# Patient Record
Sex: Male | Born: 1940 | Race: Black or African American | Hispanic: No | Marital: Married | Smoking: Former smoker
Health system: Southern US, Community
[De-identification: ages and names within clinical notes are randomized; demographics above are authoritative.]

## PROBLEM LIST (undated history)

## (undated) DIAGNOSIS — R339 Retention of urine, unspecified: Secondary | ICD-10-CM

## (undated) DIAGNOSIS — S9030XA Contusion of unspecified foot, initial encounter: Secondary | ICD-10-CM

## (undated) DIAGNOSIS — I509 Heart failure, unspecified: Secondary | ICD-10-CM

## (undated) DIAGNOSIS — R059 Cough, unspecified: Secondary | ICD-10-CM

## (undated) DIAGNOSIS — Z9981 Dependence on supplemental oxygen: Secondary | ICD-10-CM

## (undated) DIAGNOSIS — I2510 Atherosclerotic heart disease of native coronary artery without angina pectoris: Secondary | ICD-10-CM

## (undated) DIAGNOSIS — Q2730 Arteriovenous malformation, site unspecified: Secondary | ICD-10-CM

## (undated) DIAGNOSIS — IMO0002 Reserved for concepts with insufficient information to code with codable children: Secondary | ICD-10-CM

## (undated) DIAGNOSIS — I2589 Other forms of chronic ischemic heart disease: Secondary | ICD-10-CM

## (undated) DIAGNOSIS — M797 Fibromyalgia: Secondary | ICD-10-CM

## (undated) DIAGNOSIS — Z95810 Presence of automatic (implantable) cardiac defibrillator: Secondary | ICD-10-CM

## (undated) DIAGNOSIS — E1065 Type 1 diabetes mellitus with hyperglycemia: Secondary | ICD-10-CM

## (undated) DIAGNOSIS — K922 Gastrointestinal hemorrhage, unspecified: Secondary | ICD-10-CM

## (undated) DIAGNOSIS — J189 Pneumonia, unspecified organism: Secondary | ICD-10-CM

## (undated) DIAGNOSIS — R05 Cough: Secondary | ICD-10-CM

## (undated) DIAGNOSIS — E785 Hyperlipidemia, unspecified: Secondary | ICD-10-CM

## (undated) DIAGNOSIS — K5790 Diverticulosis of intestine, part unspecified, without perforation or abscess without bleeding: Secondary | ICD-10-CM

## (undated) DIAGNOSIS — I10 Essential (primary) hypertension: Secondary | ICD-10-CM

## (undated) DIAGNOSIS — K429 Umbilical hernia without obstruction or gangrene: Secondary | ICD-10-CM

## (undated) DIAGNOSIS — R809 Proteinuria, unspecified: Secondary | ICD-10-CM

## (undated) DIAGNOSIS — N6459 Other signs and symptoms in breast: Secondary | ICD-10-CM

## (undated) DIAGNOSIS — F1410 Cocaine abuse, uncomplicated: Secondary | ICD-10-CM

## (undated) DIAGNOSIS — K219 Gastro-esophageal reflux disease without esophagitis: Secondary | ICD-10-CM

## (undated) DIAGNOSIS — I639 Cerebral infarction, unspecified: Secondary | ICD-10-CM

## (undated) DIAGNOSIS — J209 Acute bronchitis, unspecified: Secondary | ICD-10-CM

## (undated) DIAGNOSIS — F1010 Alcohol abuse, uncomplicated: Secondary | ICD-10-CM

## (undated) HISTORY — DX: Cough: R05

## (undated) HISTORY — DX: Reserved for concepts with insufficient information to code with codable children: IMO0002

## (undated) HISTORY — DX: Alcohol abuse, uncomplicated: F10.10

## (undated) HISTORY — DX: Cerebral infarction, unspecified: I63.9

## (undated) HISTORY — DX: Essential (primary) hypertension: I10

## (undated) HISTORY — PX: BACK SURGERY: SHX140

## (undated) HISTORY — DX: Gastro-esophageal reflux disease without esophagitis: K21.9

## (undated) HISTORY — DX: Other forms of chronic ischemic heart disease: I25.89

## (undated) HISTORY — DX: Atherosclerotic heart disease of native coronary artery without angina pectoris: I25.10

## (undated) HISTORY — DX: Contusion of unspecified foot, initial encounter: S90.30XA

## (undated) HISTORY — DX: Retention of urine, unspecified: R33.9

## (undated) HISTORY — PX: TONSILLECTOMY: SUR1361

## (undated) HISTORY — DX: Hyperlipidemia, unspecified: E78.5

## (undated) HISTORY — DX: Cocaine abuse, uncomplicated: F14.10

## (undated) HISTORY — DX: Pneumonia, unspecified organism: J18.9

## (undated) HISTORY — DX: Cough, unspecified: R05.9

## (undated) HISTORY — DX: Fibromyalgia: M79.7

## (undated) HISTORY — DX: Gastrointestinal hemorrhage, unspecified: K92.2

## (undated) HISTORY — PX: CARDIAC DEFIBRILLATOR PLACEMENT: SHX171

## (undated) HISTORY — DX: Arteriovenous malformation, site unspecified: Q27.30

## (undated) HISTORY — DX: Type 1 diabetes mellitus with hyperglycemia: E10.65

## (undated) HISTORY — DX: Acute bronchitis, unspecified: J20.9

## (undated) HISTORY — DX: Presence of automatic (implantable) cardiac defibrillator: Z95.810

## (undated) HISTORY — DX: Umbilical hernia without obstruction or gangrene: K42.9

## (undated) HISTORY — DX: Other signs and symptoms in breast: N64.59

## (undated) HISTORY — DX: Heart failure, unspecified: I50.9

## (undated) HISTORY — DX: Proteinuria, unspecified: R80.9

## (undated) HISTORY — DX: Diverticulosis of intestine, part unspecified, without perforation or abscess without bleeding: K57.90

---

## 1997-04-16 LAB — HM COLONOSCOPY

## 1998-05-13 ENCOUNTER — Encounter: Payer: Self-pay | Admitting: Emergency Medicine

## 1998-05-13 ENCOUNTER — Emergency Department (HOSPITAL_COMMUNITY): Admission: EM | Admit: 1998-05-13 | Discharge: 1998-05-13 | Payer: Self-pay | Admitting: Emergency Medicine

## 1998-05-20 ENCOUNTER — Ambulatory Visit (HOSPITAL_COMMUNITY): Admission: RE | Admit: 1998-05-20 | Discharge: 1998-05-20 | Payer: Self-pay | Admitting: Cardiology

## 1998-05-20 ENCOUNTER — Encounter: Payer: Self-pay | Admitting: Cardiology

## 2001-01-08 ENCOUNTER — Encounter: Payer: Self-pay | Admitting: Emergency Medicine

## 2001-01-08 ENCOUNTER — Inpatient Hospital Stay (HOSPITAL_COMMUNITY): Admission: EM | Admit: 2001-01-08 | Discharge: 2001-01-10 | Payer: Self-pay | Admitting: Emergency Medicine

## 2002-05-26 ENCOUNTER — Encounter: Payer: Self-pay | Admitting: *Deleted

## 2002-05-26 ENCOUNTER — Ambulatory Visit (HOSPITAL_COMMUNITY): Admission: EM | Admit: 2002-05-26 | Discharge: 2002-05-27 | Payer: Self-pay | Admitting: *Deleted

## 2003-12-28 ENCOUNTER — Emergency Department (HOSPITAL_COMMUNITY): Admission: EM | Admit: 2003-12-28 | Discharge: 2003-12-28 | Payer: Self-pay | Admitting: Emergency Medicine

## 2004-04-14 ENCOUNTER — Ambulatory Visit: Payer: Self-pay | Admitting: Internal Medicine

## 2004-06-09 ENCOUNTER — Ambulatory Visit: Payer: Self-pay | Admitting: Internal Medicine

## 2005-01-20 ENCOUNTER — Ambulatory Visit: Payer: Self-pay | Admitting: Internal Medicine

## 2005-01-20 LAB — CONVERTED CEMR LAB: PSA: 3.06 ng/mL

## 2005-01-25 ENCOUNTER — Ambulatory Visit: Payer: Self-pay | Admitting: Internal Medicine

## 2005-02-23 ENCOUNTER — Ambulatory Visit: Payer: Self-pay

## 2005-02-23 ENCOUNTER — Encounter: Payer: Self-pay | Admitting: Cardiology

## 2005-03-01 ENCOUNTER — Ambulatory Visit: Payer: Self-pay | Admitting: *Deleted

## 2005-03-07 ENCOUNTER — Ambulatory Visit: Payer: Self-pay | Admitting: Cardiology

## 2005-03-07 ENCOUNTER — Inpatient Hospital Stay (HOSPITAL_COMMUNITY): Admission: AD | Admit: 2005-03-07 | Discharge: 2005-03-08 | Payer: Self-pay | Admitting: *Deleted

## 2005-04-11 ENCOUNTER — Ambulatory Visit: Payer: Self-pay | Admitting: *Deleted

## 2005-05-22 ENCOUNTER — Ambulatory Visit: Payer: Self-pay | Admitting: Internal Medicine

## 2005-07-07 ENCOUNTER — Encounter: Payer: Self-pay | Admitting: Cardiology

## 2005-07-07 ENCOUNTER — Ambulatory Visit: Payer: Self-pay

## 2005-07-07 ENCOUNTER — Ambulatory Visit: Payer: Self-pay | Admitting: Cardiology

## 2005-07-14 ENCOUNTER — Ambulatory Visit: Payer: Self-pay | Admitting: Internal Medicine

## 2005-07-14 ENCOUNTER — Observation Stay (HOSPITAL_COMMUNITY): Admission: RE | Admit: 2005-07-14 | Discharge: 2005-07-15 | Payer: Self-pay | Admitting: Internal Medicine

## 2005-07-27 ENCOUNTER — Ambulatory Visit: Payer: Self-pay

## 2005-10-24 ENCOUNTER — Ambulatory Visit: Payer: Self-pay | Admitting: Internal Medicine

## 2006-01-03 ENCOUNTER — Ambulatory Visit: Payer: Self-pay | Admitting: Internal Medicine

## 2006-01-05 LAB — CONVERTED CEMR LAB
ALT: 16 units/L (ref 0–40)
AST: 15 units/L (ref 0–37)
Cholesterol: 120 mg/dL (ref 0–200)
Creatinine, Ser: 1.1 mg/dL (ref 0.4–1.5)
HDL: 30.5 mg/dL — ABNORMAL LOW (ref >39.0)
Hgb A1c MFr Bld: 8.2 % — ABNORMAL HIGH (ref 4.6–6.0)
Triglyceride fasting, serum: 70 mg/dL (ref 0–149)
VLDL: 14 mg/dL (ref 0–40)

## 2006-01-11 ENCOUNTER — Ambulatory Visit: Payer: Self-pay | Admitting: Internal Medicine

## 2006-01-11 ENCOUNTER — Ambulatory Visit: Payer: Self-pay | Admitting: Cardiovascular Disease

## 2006-01-12 ENCOUNTER — Ambulatory Visit: Payer: Self-pay | Admitting: Internal Medicine

## 2006-01-15 ENCOUNTER — Ambulatory Visit: Payer: Self-pay | Admitting: Internal Medicine

## 2006-01-17 ENCOUNTER — Ambulatory Visit: Payer: Self-pay | Admitting: Internal Medicine

## 2006-02-11 ENCOUNTER — Emergency Department (HOSPITAL_COMMUNITY): Admission: EM | Admit: 2006-02-11 | Discharge: 2006-02-12 | Payer: Self-pay | Admitting: Emergency Medicine

## 2006-03-08 ENCOUNTER — Ambulatory Visit: Payer: Self-pay | Admitting: Internal Medicine

## 2006-03-22 ENCOUNTER — Ambulatory Visit: Payer: Self-pay | Admitting: Internal Medicine

## 2006-07-26 ENCOUNTER — Ambulatory Visit: Payer: Self-pay

## 2006-07-26 ENCOUNTER — Encounter: Payer: Self-pay | Admitting: Internal Medicine

## 2006-07-26 ENCOUNTER — Ambulatory Visit: Payer: Self-pay | Admitting: Internal Medicine

## 2006-07-27 ENCOUNTER — Ambulatory Visit: Payer: Self-pay | Admitting: Internal Medicine

## 2006-11-05 ENCOUNTER — Ambulatory Visit: Payer: Self-pay | Admitting: Internal Medicine

## 2006-11-14 ENCOUNTER — Encounter: Payer: Self-pay | Admitting: Internal Medicine

## 2006-11-14 ENCOUNTER — Ambulatory Visit: Payer: Self-pay | Admitting: Internal Medicine

## 2006-11-14 DIAGNOSIS — I10 Essential (primary) hypertension: Secondary | ICD-10-CM

## 2006-11-14 DIAGNOSIS — E119 Type 2 diabetes mellitus without complications: Secondary | ICD-10-CM

## 2006-11-23 ENCOUNTER — Encounter: Payer: Self-pay | Admitting: Internal Medicine

## 2006-11-23 DIAGNOSIS — K219 Gastro-esophageal reflux disease without esophagitis: Secondary | ICD-10-CM

## 2006-11-23 DIAGNOSIS — I2510 Atherosclerotic heart disease of native coronary artery without angina pectoris: Secondary | ICD-10-CM | POA: Insufficient documentation

## 2006-12-10 ENCOUNTER — Ambulatory Visit: Payer: Self-pay | Admitting: Internal Medicine

## 2006-12-10 LAB — CONVERTED CEMR LAB: Hgb A1c MFr Bld: 7.8 % — ABNORMAL HIGH (ref 4.6–6.0)

## 2006-12-17 ENCOUNTER — Ambulatory Visit: Payer: Self-pay | Admitting: Internal Medicine

## 2006-12-17 DIAGNOSIS — E785 Hyperlipidemia, unspecified: Secondary | ICD-10-CM

## 2006-12-17 DIAGNOSIS — IMO0001 Reserved for inherently not codable concepts without codable children: Secondary | ICD-10-CM

## 2006-12-27 ENCOUNTER — Ambulatory Visit: Payer: Self-pay | Admitting: Internal Medicine

## 2007-01-30 ENCOUNTER — Telehealth: Payer: Self-pay | Admitting: Internal Medicine

## 2007-02-11 ENCOUNTER — Ambulatory Visit: Payer: Self-pay | Admitting: Internal Medicine

## 2007-02-12 ENCOUNTER — Ambulatory Visit: Payer: Self-pay | Admitting: Internal Medicine

## 2007-02-12 DIAGNOSIS — N6459 Other signs and symptoms in breast: Secondary | ICD-10-CM

## 2007-02-12 LAB — CONVERTED CEMR LAB: Prolactin: 2.6 ng/mL

## 2007-02-18 ENCOUNTER — Telehealth: Payer: Self-pay | Admitting: Internal Medicine

## 2007-02-20 ENCOUNTER — Encounter: Payer: Self-pay | Admitting: Internal Medicine

## 2007-03-07 ENCOUNTER — Encounter (INDEPENDENT_AMBULATORY_CARE_PROVIDER_SITE_OTHER): Payer: Self-pay | Admitting: *Deleted

## 2007-04-02 ENCOUNTER — Ambulatory Visit: Payer: Self-pay | Admitting: Internal Medicine

## 2007-04-02 LAB — CONVERTED CEMR LAB
Hgb A1c MFr Bld: 10.4 % — ABNORMAL HIGH (ref 4.6–6.0)
Sodium: 140 meq/L (ref 135–145)

## 2007-04-04 ENCOUNTER — Encounter: Payer: Self-pay | Admitting: Internal Medicine

## 2007-04-05 ENCOUNTER — Ambulatory Visit: Payer: Self-pay | Admitting: Internal Medicine

## 2007-04-12 ENCOUNTER — Ambulatory Visit: Payer: Self-pay | Admitting: Internal Medicine

## 2007-04-15 ENCOUNTER — Telehealth: Payer: Self-pay | Admitting: Internal Medicine

## 2007-04-17 ENCOUNTER — Ambulatory Visit: Payer: Self-pay | Admitting: Endocrinology

## 2007-04-17 DIAGNOSIS — S9030XA Contusion of unspecified foot, initial encounter: Secondary | ICD-10-CM

## 2007-05-08 ENCOUNTER — Ambulatory Visit: Payer: Self-pay | Admitting: Endocrinology

## 2007-06-24 ENCOUNTER — Ambulatory Visit: Payer: Self-pay | Admitting: Endocrinology

## 2007-07-31 ENCOUNTER — Ambulatory Visit: Payer: Self-pay | Admitting: Internal Medicine

## 2007-08-05 ENCOUNTER — Ambulatory Visit: Payer: Self-pay | Admitting: Endocrinology

## 2007-11-06 ENCOUNTER — Ambulatory Visit: Payer: Self-pay | Admitting: Internal Medicine

## 2007-11-19 ENCOUNTER — Ambulatory Visit: Payer: Self-pay | Admitting: Internal Medicine

## 2007-11-19 DIAGNOSIS — J209 Acute bronchitis, unspecified: Secondary | ICD-10-CM

## 2007-12-26 ENCOUNTER — Encounter: Payer: Self-pay | Admitting: Endocrinology

## 2008-01-14 ENCOUNTER — Ambulatory Visit: Payer: Self-pay | Admitting: Endocrinology

## 2008-01-21 ENCOUNTER — Ambulatory Visit: Payer: Self-pay | Admitting: Internal Medicine

## 2008-01-21 LAB — CONVERTED CEMR LAB
CO2: 28 meq/L (ref 19–32)
Calcium: 9.1 mg/dL (ref 8.4–10.5)
Chloride: 107 meq/L (ref 96–112)
Glucose, Bld: 142 mg/dL — ABNORMAL HIGH (ref 70–99)
Pro B Natriuretic peptide (BNP): 711 pg/mL — ABNORMAL HIGH (ref 0.0–100.0)
Sodium: 141 meq/L (ref 135–145)

## 2008-03-10 ENCOUNTER — Ambulatory Visit: Payer: Self-pay | Admitting: Endocrinology

## 2008-03-10 DIAGNOSIS — J189 Pneumonia, unspecified organism: Secondary | ICD-10-CM | POA: Insufficient documentation

## 2008-03-10 DIAGNOSIS — R05 Cough: Secondary | ICD-10-CM

## 2008-03-12 ENCOUNTER — Encounter: Payer: Self-pay | Admitting: Internal Medicine

## 2008-04-02 ENCOUNTER — Ambulatory Visit: Payer: Self-pay | Admitting: Internal Medicine

## 2008-04-03 ENCOUNTER — Telehealth (INDEPENDENT_AMBULATORY_CARE_PROVIDER_SITE_OTHER): Payer: Self-pay | Admitting: *Deleted

## 2008-04-09 ENCOUNTER — Ambulatory Visit: Payer: Self-pay | Admitting: Internal Medicine

## 2008-04-14 ENCOUNTER — Ambulatory Visit: Payer: Self-pay | Admitting: Internal Medicine

## 2008-04-14 DIAGNOSIS — R109 Unspecified abdominal pain: Secondary | ICD-10-CM | POA: Insufficient documentation

## 2008-04-14 DIAGNOSIS — R339 Retention of urine, unspecified: Secondary | ICD-10-CM

## 2008-04-15 LAB — CONVERTED CEMR LAB
Bilirubin Urine: NEGATIVE
CO2: 29 meq/L (ref 19–32)
Chloride: 105 meq/L (ref 96–112)
GFR calc non Af Amer: 23 mL/min
Leukocytes, UA: NEGATIVE
Nitrite: NEGATIVE
PSA: 5.22 ng/mL — ABNORMAL HIGH (ref 0.10–4.00)
Potassium: 4.3 meq/L (ref 3.5–5.1)
Sodium: 143 meq/L (ref 135–145)
Specific Gravity, Urine: 1.025 (ref 1.000–1.035)
Total Protein, Urine: 100 mg/dL — AB
pH: 5.5 (ref 5.0–8.0)

## 2008-07-04 DIAGNOSIS — I5022 Chronic systolic (congestive) heart failure: Secondary | ICD-10-CM | POA: Insufficient documentation

## 2008-07-04 DIAGNOSIS — Z95810 Presence of automatic (implantable) cardiac defibrillator: Secondary | ICD-10-CM

## 2008-07-04 DIAGNOSIS — Z9089 Acquired absence of other organs: Secondary | ICD-10-CM

## 2008-07-09 ENCOUNTER — Ambulatory Visit: Payer: Self-pay | Admitting: Internal Medicine

## 2008-07-09 ENCOUNTER — Encounter: Payer: Self-pay | Admitting: Internal Medicine

## 2008-07-10 ENCOUNTER — Encounter: Payer: Self-pay | Admitting: Internal Medicine

## 2008-08-05 ENCOUNTER — Telehealth (INDEPENDENT_AMBULATORY_CARE_PROVIDER_SITE_OTHER): Payer: Self-pay | Admitting: *Deleted

## 2008-10-23 ENCOUNTER — Ambulatory Visit: Payer: Self-pay | Admitting: Internal Medicine

## 2008-11-25 ENCOUNTER — Telehealth: Payer: Self-pay | Admitting: Internal Medicine

## 2008-11-25 ENCOUNTER — Encounter: Payer: Self-pay | Admitting: Internal Medicine

## 2009-01-22 ENCOUNTER — Encounter (INDEPENDENT_AMBULATORY_CARE_PROVIDER_SITE_OTHER): Payer: Self-pay | Admitting: *Deleted

## 2009-02-10 ENCOUNTER — Telehealth: Payer: Self-pay | Admitting: Internal Medicine

## 2009-02-12 ENCOUNTER — Ambulatory Visit: Payer: Self-pay | Admitting: Endocrinology

## 2009-02-12 DIAGNOSIS — R809 Proteinuria, unspecified: Secondary | ICD-10-CM | POA: Insufficient documentation

## 2009-02-12 LAB — CONVERTED CEMR LAB
Creatinine,U: 200.6 mg/dL
Hgb A1c MFr Bld: 6.9 % — ABNORMAL HIGH (ref 4.6–6.5)
Microalb Creat Ratio: 177.5 mg/g — ABNORMAL HIGH (ref 0.0–30.0)
Microalb, Ur: 35.6 mg/dL — ABNORMAL HIGH (ref 0.0–1.9)

## 2009-04-27 ENCOUNTER — Ambulatory Visit: Payer: Self-pay | Admitting: Endocrinology

## 2009-06-02 ENCOUNTER — Ambulatory Visit: Payer: Self-pay | Admitting: Internal Medicine

## 2009-06-02 DIAGNOSIS — I739 Peripheral vascular disease, unspecified: Secondary | ICD-10-CM

## 2009-06-04 ENCOUNTER — Encounter: Payer: Self-pay | Admitting: Internal Medicine

## 2009-06-16 ENCOUNTER — Encounter: Payer: Self-pay | Admitting: Internal Medicine

## 2009-06-20 ENCOUNTER — Inpatient Hospital Stay (HOSPITAL_COMMUNITY): Admission: EM | Admit: 2009-06-20 | Discharge: 2009-06-30 | Payer: Self-pay | Admitting: Family Medicine

## 2009-06-23 ENCOUNTER — Ambulatory Visit: Payer: Self-pay | Admitting: Physical Medicine & Rehabilitation

## 2009-06-25 ENCOUNTER — Ambulatory Visit: Payer: Self-pay | Admitting: Gastroenterology

## 2009-07-05 ENCOUNTER — Ambulatory Visit: Payer: Self-pay | Admitting: Internal Medicine

## 2009-07-05 ENCOUNTER — Inpatient Hospital Stay (HOSPITAL_COMMUNITY): Admission: EM | Admit: 2009-07-05 | Discharge: 2009-07-08 | Payer: Self-pay | Admitting: Emergency Medicine

## 2009-07-06 ENCOUNTER — Encounter: Payer: Self-pay | Admitting: Internal Medicine

## 2009-07-07 ENCOUNTER — Encounter (INDEPENDENT_AMBULATORY_CARE_PROVIDER_SITE_OTHER): Payer: Self-pay | Admitting: Internal Medicine

## 2009-08-01 ENCOUNTER — Encounter: Payer: Self-pay | Admitting: Internal Medicine

## 2009-08-03 ENCOUNTER — Ambulatory Visit: Payer: Self-pay | Admitting: Internal Medicine

## 2009-09-16 ENCOUNTER — Telehealth: Payer: Self-pay | Admitting: Internal Medicine

## 2009-09-17 ENCOUNTER — Ambulatory Visit: Payer: Self-pay

## 2009-09-17 ENCOUNTER — Encounter: Payer: Self-pay | Admitting: Internal Medicine

## 2009-11-11 ENCOUNTER — Encounter: Payer: Self-pay | Admitting: Internal Medicine

## 2010-01-16 ENCOUNTER — Encounter: Payer: Self-pay | Admitting: Internal Medicine

## 2010-01-19 ENCOUNTER — Encounter: Payer: Self-pay | Admitting: Internal Medicine

## 2010-01-21 ENCOUNTER — Ambulatory Visit: Payer: Self-pay | Admitting: Internal Medicine

## 2010-01-21 DIAGNOSIS — I69959 Hemiplegia and hemiparesis following unspecified cerebrovascular disease affecting unspecified side: Secondary | ICD-10-CM | POA: Insufficient documentation

## 2010-01-25 ENCOUNTER — Telehealth: Payer: Self-pay | Admitting: Internal Medicine

## 2010-02-01 ENCOUNTER — Encounter: Payer: Self-pay | Admitting: Internal Medicine

## 2010-02-02 ENCOUNTER — Encounter (INDEPENDENT_AMBULATORY_CARE_PROVIDER_SITE_OTHER): Payer: Self-pay | Admitting: *Deleted

## 2010-02-03 ENCOUNTER — Telehealth: Payer: Self-pay | Admitting: Internal Medicine

## 2010-02-03 ENCOUNTER — Encounter: Payer: Self-pay | Admitting: Internal Medicine

## 2010-02-09 ENCOUNTER — Telehealth: Payer: Self-pay | Admitting: Internal Medicine

## 2010-02-11 ENCOUNTER — Encounter: Payer: Self-pay | Admitting: Internal Medicine

## 2010-02-16 ENCOUNTER — Encounter: Payer: Self-pay | Admitting: Internal Medicine

## 2010-02-17 ENCOUNTER — Telehealth: Payer: Self-pay | Admitting: Internal Medicine

## 2010-02-18 ENCOUNTER — Encounter: Payer: Self-pay | Admitting: Internal Medicine

## 2010-02-21 ENCOUNTER — Encounter: Payer: Self-pay | Admitting: Internal Medicine

## 2010-02-21 ENCOUNTER — Ambulatory Visit
Admission: RE | Admit: 2010-02-21 | Discharge: 2010-02-21 | Payer: Self-pay | Source: Home / Self Care | Attending: Internal Medicine | Admitting: Internal Medicine

## 2010-02-21 ENCOUNTER — Telehealth: Payer: Self-pay | Admitting: Internal Medicine

## 2010-02-23 ENCOUNTER — Encounter: Payer: Self-pay | Admitting: Internal Medicine

## 2010-02-24 ENCOUNTER — Ambulatory Visit: Admit: 2010-02-24 | Payer: Self-pay | Admitting: Internal Medicine

## 2010-03-01 ENCOUNTER — Encounter: Payer: Self-pay | Admitting: Internal Medicine

## 2010-03-07 ENCOUNTER — Encounter: Payer: Self-pay | Admitting: Internal Medicine

## 2010-03-08 ENCOUNTER — Encounter: Payer: Self-pay | Admitting: Internal Medicine

## 2010-03-08 ENCOUNTER — Telehealth: Payer: Self-pay | Admitting: Internal Medicine

## 2010-03-08 NOTE — Progress Notes (Signed)
Summary: defib soreness & arm weakness   Phone Note From Other Clinic   Caller: nurse Tim  Summary of Call: Per Anibal Henderson soreness around defib site and left arm weakness. [phone] Initial call taken by: Edman Circle,  September 16, 2009 4:00 PM  Follow-up for Phone Call        SPOKE WITH CRYSTAL AT NURSING FACILITY  PER WIFE SWELLING AT DEVICE SITE NOT NOTED FOR LONG PERIOD  CRYSTAL AWARE CAN SEE PT TODAY WITH PAULA DEVICE NURSE AND CHECK DEVICE  TO MAKE SURE DEVICE WORKING PROPERLY AND TO LOOK AT SITE  PER NURSE WILL TRY TO MAKE TRANSPORTATION ARRANGEMENTS FOR PT TO BE SEEN THIS AM WILL CALL BACK IF UNABLE TO COME IN . Follow-up by: Scherrie Bateman, LPN,  September 17, 2009 8:46 AM  Additional Follow-up for Phone Call Additional follow up Details #1::        CRYSTAL FROM [facility]  CALLED AND PT CAN BE HERE WITHIN THE HOUR . Additional Follow-up by: Scherrie Bateman, LPN,  September 17, 2009 9:10 AM

## 2010-03-08 NOTE — Letter (Signed)
Summary: Device-Delinquent Phone Journalist, newspaper, Main Office  [address]   [place], [place] [address]   Phone: [phone]  Fax: [phone]     November 11, 2009 MRN: [id number]   CARLOS HEBER [address] [place], [place]  [address]   Dear Mr. Hunke,  According to our records, you were scheduled for a device phone transmission on  11-04-09.     We did not receive any results from this check.  If you transmitted on your scheduled day, please call us to help troubleshoot your system.  If you forgot to send your transmission, please send one upon receipt of this letter.  Thank you,   Architectural technologist Device Clinic

## 2010-03-08 NOTE — Assessment & Plan Note (Signed)
Summary: df2/post hosp/discharged on 6/2/OK PER KELLY/LG  Medications Added CARVEDILOL 25 MG  TABS (CARVEDILOL) TAKE  1/2 TAB two times a day NOVOLOG 100 UNIT/ML SOLN (INSULIN ASPART) UAD LANTUS 100 UNIT/ML SOLN (INSULIN GLARGINE) UAD PROSCAR 5 MG TABS (FINASTERIDE) Take one tablet by mouth once daily. HYDRALAZINE HCL 25 MG TABS (HYDRALAZINE HCL) Take one tablet by mouth three times a day FAMOTIDINE 20 MG TABS (FAMOTIDINE) 1/2 tablet three times a day VENTOLIN HFA 108 (90 BASE) MCG/ACT AERS (ALBUTEROL SULFATE) UAD FUROSEMIDE 40 MG TABS (FUROSEMIDE) Take one tablet by mouth daily.      Allergies Added:   Visit Type:  Follow-up Primary Provider:  Norins   History of Present Illness: [name] returns today for followup.  He is a pleasant 70 yo man with a h/o DCM, CHF, HTN, and is s/p ICD implant.  The patient experienced a stroke several weeks ago and was discharged with residual weakness.  He has had no dyspnea but does note some increased peripheral edema.  No ICD shocks.  No c/p or syncope.  His stroke was thought secondary to a CNS bleed.  Current Medications (verified): 1)  Simvastatin 40 Mg  Tabs (Simvastatin) .... Q Pm 2)  Carvedilol 25 Mg  Tabs (Carvedilol) .... Take  1/2 Tab Two Times A Day 3)  Flomax 0.4 Mg Cp24 (Tamsulosin Hcl) .... Take 1 Capsule By Mouth Nightly 4)  Novolog 100 Unit/ml Soln (Insulin Aspart) .... Uad 5)  Lantus 100 Unit/ml Soln (Insulin Glargine) .... Uad 6)  Proscar 5 Mg Tabs (Finasteride) .... Take One Tablet By Mouth Once Daily. 7)  Hydralazine Hcl 25 Mg Tabs (Hydralazine Hcl) .... Take One Tablet By Mouth Three Times A Day 8)  Famotidine 20 Mg Tabs (Famotidine) .... 1/2 Tablet Three Times A Day 9)  Ventolin Hfa 108 (90 Base) Mcg/act Aers (Albuterol Sulfate) .... Uad  Allergies (verified): 1)  ! 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) IMPLANTATION OF DEFIBRILLATOR, HX OF (ICD-V45.02) CONGESTIVE HEART FAILURE (ICD-428.0) ABDOMINAL PAIN (ICD-789.00) URINARY RETENTION (ICD-788.20) PNEUMONIA (ICD-486) COUGH (ICD-786.2) ACUTE BRONCHITIS (ICD-466.0) CONTUSION OF FOOT (ICD-924.20) OTHER SIGN AND SYMPTOM IN BREAST (ICD-611.79) FIBROMYALGIA (ICD-729.1) HYPERLIPIDEMIA (ICD-272.4) CORONARY ARTERY DISEASE (ICD-414.00) GERD (ICD-530.81) DM, UNCOMPLICATED, TYPE I, UNCONTROLLED (ICD-250.03) HYPERTENSION, BENIGN ESSENTIAL (ICD-401.1)    Past Surgical History: Last updated: 07/04/2008 * BACK SURGERY TONSILLECTOMY, HX OF (ICD-V45.79)  Review of Systems       The patient complains of peripheral edema.  The patient denies chest pain, syncope, and dyspnea on exertion.    Vital Signs:  Patient profile:   70 year old male Height:      68 inches  Vitals Entered By: Laurance Flatten CMA (August 03, 2009 1:40 PM)  Physical Exam  General:  obese.  no distress  Head:  normocephalic and atraumatic Eyes:  PERRLA/EOM intact; conjunctiva and lids normal. Mouth:  Teeth, gums and palate normal. Oral mucosa normal. Neck:  Supple without thyroid enlargement or tenderness.  Chest Wall:  well healed ICD incision. Lungs:  Clear bilaterally with no wheezes, rales, or rhonchi. Heart:  RRR with normal S1 and a split S2.  PMI is enlarged and laterally displaced.  No murmurs. Abdomen:  Bowel sounds positive; abdomen soft and non-tender without masses, organomegaly, or hernias noted. No hepatosplenomegaly. Msk:  Back normal, normal gait. Muscle strength and tone normal. Pulses:  dorsalis pedis intact bilat but decreased. Extremities:  no deformity.  no ulcer on the feet.  feet are of normal color and temp. 2+ peripheral edema bilaterally. Neurologic:  Right arm and leg weakness.    ICD Specifications Following MD:  Lewayne Bunting, MD     ICD Vendor:  [facility] [employer]     ICD Model Number:  T175     ICD Serial Number:  [id number] ICD  DOI:  07/14/2005     ICD Implanting MD:  Lewayne Bunting, MD  Lead 1:    Location: RV     DOI: 07/14/2005     Model #: [id number]     Serial #: [id number]     Status: active  Indications::  NICM   ICD Follow Up Remote Check?  No Battery Voltage:  2.66 V     Charge Time:  13.6 seconds     Underlying rhythm:  ST ICD Dependent:  No       ICD Device Measurements Right Ventricle:  Amplitude: 18.5 mV, Impedance: 607 ohms, Threshold: 0.8 V at 0.4 msec Shock Impedance: 39 ohms   Episodes Coumadin:  No Shock:  0     ATP:  0     Nonsustained:  0     Ventricular Pacing:  <1%  Brady Parameters Mode VVI     Lower Rate Limit:  40      Tachy Zones VF:  210     VT1:  180     Next Remote Date:  11/04/2009     Next Cardiology Appt Due:  07/08/2010 Tech Comments:  Normal device function.  RV output decreaesd today to 2.4V.  No other changes made.  Pt does Latitude transmissions.  ROV 12 months GT. Gypsy Balsam RN BSN  August 03, 2009 1:53 PM  MD Comments:  Agree with above.  Impression & Recommendations:  Problem # 1:  IMPLANTATION OF DEFIBRILLATOR, HX OF (ICD-V45.02) His device is working normally. Will recheck in several months.  Problem # 2:  CONGESTIVE HEART FAILURE (ICD-428.0)  He remains class 2 but is somewhat fluid overloaded with peripheral edema.  I have asked him to restart lasix 40 mg daily. The following medications were removed from the medication list:    Bayer Aspirin 325 Mg Tabs (Aspirin) ..... Once daily    Furosemide 20 Mg Tabs (Furosemide) .Marland Kitchen... 2 in am/ 1 in pm    Benicar 40 Mg Tabs (Olmesartan medoxomil) ..... By mouth once daily His updated medication list for this problem includes:    Carvedilol 25 Mg Tabs (Carvedilol) .Marland Kitchen... Take  1/2 tab two times a day    Furosemide 40 Mg Tabs (Furosemide) .Marland Kitchen... Take one tablet by mouth daily.  The following medications were removed from the medication list:    Bayer Aspirin 325 Mg Tabs (Aspirin) ..... Once daily    Furosemide 20 Mg Tabs  (Furosemide) .Marland Kitchen... 2 in am/ 1 in pm    Benicar 40 Mg Tabs (Olmesartan medoxomil) ..... By mouth once daily His updated medication list for this problem includes:    Carvedilol 25 Mg Tabs (Carvedilol) .Marland Kitchen... Take  1/2 tab two times a day    Furosemide 40 Mg Tabs (Furosemide) .Marland Kitchen... Take one tablet by mouth daily.  Problem # 3:  CONGESTIVE HEART FAILURE (ICD-428.0)  The following medications were removed from the medication list:    Bayer Aspirin 325 Mg Tabs (Aspirin) ..... Once daily    Furosemide 20 Mg Tabs (Furosemide) .Marland Kitchen... 2 in am/ 1 in pm    Benicar 40 Mg Tabs (Olmesartan medoxomil) .Marland KitchenMarland KitchenMarland KitchenMarland Kitchen  By mouth once daily His updated medication list for this problem includes:    Carvedilol 25 Mg Tabs (Carvedilol) .Marland Kitchen... Take  1/2 tab two times a day    Furosemide 40 Mg Tabs (Furosemide) .Marland Kitchen... Take one tablet by mouth daily. Prescriptions: FUROSEMIDE 40 MG TABS (FUROSEMIDE) Take one tablet by mouth daily.  #30 x 11   Entered by:   Laurance Flatten CMA   Authorized by:   Laren Boom, MD, [facility]   Signed by:   Laurance Flatten CMA on 08/03/2009   Method used:   Print then Give to Patient   RxID:   [id number]

## 2010-03-08 NOTE — Assessment & Plan Note (Signed)
Summary: f1y/jss  Medications Added RANITIDINE HCL 75 MG  TABS (RANITIDINE HCL) two times a day KLOR-CON M20 20 MEQ  TBCR (POTASSIUM CHLORIDE CRYS CR) 1 tab am, 1 tab pm      Allergies Added:   Primary Provider:  Norins  CC:  follow up 1 year.  History of Present Illness: [name] returns today for followup.  His main complaint is weakness in his legs when he walks, particularly if he goes up a hill.  No outright pain.  He denies c/p  but does have dyspnea with exertion.  No intercurrent ICD shocks.  Current Medications (verified): 1)  Simvastatin 40 Mg  Tabs (Simvastatin) .... Q Pm 2)  Bayer Aspirin 325 Mg  Tabs (Aspirin) .... Once Daily 3)  Ranitidine Hcl 75 Mg  Tabs (Ranitidine Hcl) .... Two Times A Day 4)  Carvedilol 25 Mg  Tabs (Carvedilol) .... Take 1 1/2 Tab Two Times A Day 5)  Klor-Con M20 20 Meq  Tbcr (Potassium Chloride Crys Cr) .Marland Kitchen.. 1 Tab Am, 1 Tab Pm 6)  Furosemide 20 Mg  Tabs (Furosemide) .... 2 in Am/ 1 in Pm 7)  Benicar 40 Mg  Tabs (Olmesartan Medoxomil) .... By Mouth Once Daily 8)  Freestyle Lite Test  Strp (Glucose Blood) .... Two Times A Day, 250.01, and Lancets 9)  Flomax 0.4 Mg Cp24 (Tamsulosin Hcl) .... Take 1 Capsule By Mouth Nightly 10)  Avodart 0.5 Mg Caps (Dutasteride) .Marland Kitchen.. 1 By Mouth Daily 11)  Humulin 70/30 70-30 % Susp (Insulin Isophane & Regular) .... 30 Units Am and 20 Units Before The Evening Meal.  Allergies (verified): 1)  ! 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) IMPLANTATION OF DEFIBRILLATOR, HX OF (ICD-V45.02) CONGESTIVE HEART FAILURE (ICD-428.0) ABDOMINAL PAIN (ICD-789.00) URINARY RETENTION (ICD-788.20) PNEUMONIA (ICD-486) COUGH (ICD-786.2) ACUTE BRONCHITIS (ICD-466.0) CONTUSION OF FOOT (ICD-924.20) OTHER SIGN AND SYMPTOM IN BREAST (ICD-611.79) FIBROMYALGIA (ICD-729.1) HYPERLIPIDEMIA (ICD-272.4) CORONARY ARTERY DISEASE  (ICD-414.00) GERD (ICD-530.81) DM, UNCOMPLICATED, TYPE I, UNCONTROLLED (ICD-250.03) HYPERTENSION, BENIGN ESSENTIAL (ICD-401.1)    Past Surgical History: Last updated: 07/04/2008 * BACK SURGERY TONSILLECTOMY, HX OF (ICD-V45.79)  Review of Systems  The patient denies chest pain, syncope, dyspnea on exertion, and peripheral edema.    Vital Signs:  Patient profile:   70 year old male Height:      68 inches Weight:      223 pounds BMI:     34.03 Pulse rate:   62 / minute Pulse rhythm:   regular BP sitting:   148 / 76  (left arm) Cuff size:   large  Vitals Entered By: Judithe Modest CMA (June 02, 2009 11:09 AM)  Physical Exam  General:  obese.  no distress  Head:  normocephalic and atraumatic Eyes:  PERRLA/EOM intact; conjunctiva and lids normal. Mouth:  Teeth, gums and palate normal. Oral mucosa normal. Neck:  Supple without thyroid enlargement or tenderness.  Chest Wall:  well healed ICD incision. Lungs:  Clear bilaterally with no wheezes, rales, or rhonchi. Heart:  RRR with normal S1 and a split S2.  PMI is enlarged and laterally displaced.  No murmurs. Abdomen:  Bowel sounds positive; abdomen soft and non-tender without masses, organomegaly, or hernias noted. No hepatosplenomegaly. Msk:  Back normal, normal gait. Muscle strength and tone normal. Pulses:  dorsalis pedis intact bilat but decreased. Extremities:  no deformity.  no ulcer on the feet.  feet are of normal color and temp.  trace right pedal edema and trace left pedal edema.   Neurologic:  Alert and oriented x 3.   EKG  Procedure date:  06/02/2009  Findings:      Normal sinus rhythm with rate of:62.  First degree AV-Block noted.  Left axis deviation.  Right bundle branch block.     ICD Specifications Following MD:  Lewayne Bunting, MD     ICD Vendor:  [facility] [employer]     ICD Model Number:  T175     ICD Serial Number:  [id number] ICD DOI:  07/14/2005     ICD Implanting MD:  Lewayne Bunting, MD  Lead 1:     Location: RV     DOI: 07/14/2005     Model #: [id number]     Serial #: [id number]     Status: active  Indications::  NICM   ICD Follow Up Remote Check?  No Battery Voltage:  2.68 V     Charge Time:  13.6 seconds     Underlying rhythm:  SR ICD Dependent:  No       ICD Device Measurements Right Ventricle:  Amplitude: 22Q mV, Impedance: 615 ohms, Threshold: 0.6 V at 0.4 msec Shock Impedance: 43 ohms   Episodes Coumadin:  No Shock:  0     ATP:  0     Nonsustained:  1     Ventricular Pacing:  0%  Brady Parameters Mode VVI     Lower Rate Limit:  40      Tachy Zones VF:  210     VT1:  180     Next Remote Date:  09/02/2009     Next Cardiology Appt Due:  05/09/2010 Tech Comments:  Normal device function.  No changes made today.  Pt c/o lack of energy.  Histagrams appropriate.  1 NSVT episode was diverted.  Pt does Latitude transmissions.  ROV 12 months GT. Gypsy Balsam RN BSN  June 02, 2009 11:29 AM  MD Comments:  Agree with above.  Impression & Recommendations:  Problem # 1:  IMPLANTATION OF DEFIBRILLATOR, HX OF (ICD-V45.02) His device is working normally.  Will recheck in several months.  Problem # 2:  NONISCHEMIC CARDIOMYOPATHY (ICD-414.8)  He denies anginal symptoms or worsening SOB. His updated medication list for this problem includes:    Bayer Aspirin 325 Mg Tabs (Aspirin) ..... Once daily    Carvedilol 25 Mg Tabs (Carvedilol) .Marland Kitchen... Take 1 1/2 tab two times a day    Furosemide 20 Mg Tabs (Furosemide) .Marland Kitchen... 2 in am/ 1 in pm    Benicar 40 Mg Tabs (Olmesartan medoxomil) ..... By mouth once daily  His updated medication list for this problem includes:    Bayer Aspirin 325 Mg Tabs (Aspirin) ..... Once daily    Carvedilol 25 Mg Tabs (Carvedilol) .Marland Kitchen... Take 1 1/2 tab two times a day    Furosemide 20 Mg Tabs (Furosemide) .Marland Kitchen... 2 in am/ 1 in pm    Benicar 40 Mg Tabs (Olmesartan medoxomil) ..... By mouth once daily  Problem # 3:  CONGESTIVE HEART FAILURE (ICD-428.0)  His symptoms remain  class 2.  Continue meds as noted below. His updated medication list for this problem includes:    Bayer Aspirin 325 Mg Tabs (Aspirin) ..... Once daily    Carvedilol 25 Mg Tabs (Carvedilol) .Marland Kitchen... Take 1 1/2 tab two times a day    Furosemide 20 Mg Tabs (Furosemide) .Marland Kitchen... 2 in am/ 1 in pm    Benicar 40 Mg Tabs (Olmesartan  medoxomil) ..... By mouth once daily  His updated medication list for this problem includes:    Bayer Aspirin 325 Mg Tabs (Aspirin) ..... Once daily    Carvedilol 25 Mg Tabs (Carvedilol) .Marland Kitchen... Take 1 1/2 tab two times a day    Furosemide 20 Mg Tabs (Furosemide) .Marland Kitchen... 2 in am/ 1 in pm    Benicar 40 Mg Tabs (Olmesartan medoxomil) ..... 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  Other Orders: Arterial Duplex Lower Extremity (Arterial Duplex Low)  Patient Instructions: 1)  Your physician has requested that you have an ankle brachial index (ABI). During this test an ultrasound and blood pressure cuff are used to evaluate the arteries that supply the arms and legs with blood. Allow thirty minutes for this exam. There are no restrictions or special instructions. 2)  Your physician wants you to follow-up in: 12 months with Dr Ladona Ridgel.  You will receive a reminder letter in the mail two months in advance. If you don't receive a letter, please call our office to schedule the follow-up appointment.

## 2010-03-08 NOTE — Cardiovascular Report (Signed)
Summary: Office Visit   Office Visit   Imported By: Roderic Ovens 10/07/2009 15:41:12  _____________________________________________________________________  External Attachment:    Type:   Image     Comment:   External Document

## 2010-03-08 NOTE — Letter (Signed)
Summary: [name] - RX Resident Profile  [name] - RX Resident Profile   Imported By: Marylou Mccoy 09/01/2009 12:24:01  _____________________________________________________________________  External Attachment:    Type:   Image     Comment:   External Document

## 2010-03-08 NOTE — Letter (Signed)
Summary: CMN request for Diabetic Shoes/Abdulaziz Marinus Maw MD  CMN request for Diabetic Shoes/Darrnell Marinus Maw MD   Imported By: Sherian Rein 06/10/2009 09:18:02  _____________________________________________________________________  External Attachment:    Type:   Image     Comment:   External Document

## 2010-03-08 NOTE — Progress Notes (Signed)
Summary: Humulin not covered--?ReliOn  Phone Note Call from Patient Call back at [facility] Phone [phone]   Summary of Call: Spoke with patient spouse and insurance will not pay for Humulin Insulin, but RelIOn is covered. Patient needs this prescription sent to [facility] on [address]. Patient has been paying for his insulin out of pocket and really needs to switch to one that he can get insurance coverage. Please advise if this is ok, dosage, and refills. Thanks. Initial call taken by: Lucious Groves,  February 10, 2009 4:03 PM  Follow-up for Phone Call        never heard of rellon. There is novolog, humalog, humulin Follow-up by: Jacques Navy MD,  February 10, 2009 5:17 PM  Additional Follow-up for Phone Call Additional follow up Details #1::        Relion is the [employer] brand, pt can get it cheaper but we must change med in EMR to relion humulin and send in rx. OK?  Additional Follow-up by: Lamar Sprinkles, CMA,  February 11, 2009 9:57 AM    Additional Follow-up for Phone Call Additional follow up Details #2::    OK to change. I can't find relion either - what type of insulin is it.  Follow-up by: Jacques Navy MD,  February 11, 2009 1:08 PM  Additional Follow-up for Phone Call Additional follow up Details #3:: Details for Additional Follow-up Action Taken: Sorry I wasnt clear, it is called Relion humulin 70/30, same insulin but [employer] brand ([facility] makes it). EMR system does not have it updated. It used to be Relion novolin.  Will free text and electronically fax......................Marland KitchenLamar Sprinkles, CMA  February 11, 2009 2:05 PM  Additional Follow-up by: Jacques Navy MD,  February 11, 2009 5:19 PM  New/Updated Medications: * RELION HUMULIN 70/30 70-30 %  (INSULIN ISOPHANE & REGULAR) 20units AM/ 10 units PM Prescriptions: RELION HUMULIN 70/30 70-30 %  (INSULIN ISOPHANE & REGULAR) 20units AM/ 10 units PM  #3 mth supply x 1   Entered by:   Lamar Sprinkles, CMA   Authorized by:   Jacques Navy MD   Signed by:   Lamar Sprinkles, CMA on 02/11/2009   Method used:   Faxed to ...       [facility]       [address]       [place], [place]  [address]       Ph: [phone]       Fax: [phone]   RxID:   [id number]

## 2010-03-08 NOTE — Letter (Signed)
Summary: [facility]  [facility]   Imported By: Lester [place] 06/24/2009 09:25:14  _____________________________________________________________________  External Attachment:    Type:   Image     Comment:   External Document

## 2010-03-08 NOTE — Consult Note (Signed)
Summary: Consultation Report  Consultation Report   Imported By: Debby Freiberg 08/11/2009 14:38:48  _____________________________________________________________________  External Attachment:    Type:   Image     Comment:   External Document

## 2010-03-08 NOTE — Cardiovascular Report (Signed)
Summary: Office Visit   Office Visit   Imported By: Roderic Ovens 08/04/2009 12:27:42  _____________________________________________________________________  External Attachment:    Type:   Image     Comment:   External Document

## 2010-03-08 NOTE — Procedures (Signed)
Summary: wch/10:00 appt-mb  Medications Added SIMVASTATIN 40 MG TABS (SIMVASTATIN) one by mouth daily SERTRALINE HCL 25 MG TABS (SERTRALINE HCL) one by mouth daily for 2 weeks then increase to 50mg  daily      Allergies Added:   Current Medications (verified): 1)  Simvastatin 40 Mg  Tabs (Simvastatin) .... Q Pm 2)  Carvedilol 25 Mg  Tabs (Carvedilol) .... Take  1/2 Tab Two Times A Day 3)  Flomax 0.4 Mg Cp24 (Tamsulosin Hcl) .... Take 1 Capsule By Mouth Nightly 4)  Novolog 100 Unit/ml Soln (Insulin Aspart) .... Uad 5)  Lantus 100 Unit/ml Soln (Insulin Glargine) .... Uad 6)  Proscar 5 Mg Tabs (Finasteride) .... Take One Tablet By Mouth Once Daily. 7)  Hydralazine Hcl 25 Mg Tabs (Hydralazine Hcl) .... Take One Tablet By Mouth Three Times A Day 8)  Famotidine 20 Mg Tabs (Famotidine) .... 1/2 Tablet Three Times A Day 9)  Ventolin Hfa 108 (90 Base) Mcg/act Aers (Albuterol Sulfate) .... Uad 10)  Furosemide 40 Mg Tabs (Furosemide) .... Take One Tablet By Mouth Daily. 11)  Simvastatin 40 Mg Tabs (Simvastatin) .... One By Mouth Daily 12)  Sertraline Hcl 25 Mg Tabs (Sertraline Hcl) .... One By Mouth Daily For 2 Weeks Then Increase To 50mg  Daily  Allergies (verified): 1)  ! Metformin Hcl (Metformin Hcl)   ICD Specifications Following MD:  Lewayne Bunting, MD     ICD Vendor:  [facility] [employer]     ICD Model Number:  T175     ICD Serial Number:  [id number] ICD DOI:  07/14/2005     ICD Implanting MD:  Lewayne Bunting, MD  Lead 1:    Location: RV     DOI: 07/14/2005     Model #: [id number]     Serial #: [id number]     Status: active  Indications::  NICM   ICD Follow Up Remote Check?  No Battery Voltage:  2.65 V     Charge Time:  13.3 seconds     Battery Est. Longevity:  MOL2 Underlying rhythm:  SR ICD Dependent:  No       ICD Device Measurements Right Ventricle:  Amplitude: 20.4 mV, Impedance: 649 ohms, Threshold: 0.8 V at 0.4 msec Shock Impedance: 42 ohms   Episodes Coumadin:  No Shock:  0     ATP:  0      Nonsustained:  0     Ventricular Pacing:  0%  Brady Parameters Mode VVI     Lower Rate Limit:  40      Tachy Zones VF:  210     VT1:  180     Next Remote Date:  11/04/2009     Tech Comments:  Mr. Woolverton was seen today as an add on c/o tenderness at his ICD site. His site is well healed without redness or edema.  Device function normal.  No parameter changes.  Instructions were sent back with the patient to [facility] to call the office is they notice any redness, edema or drainage from the site otherwise we will follow as scheduled remotely 11/04/09. Altha Harm, LPN  September 17, 2009 12:08 PM

## 2010-03-08 NOTE — Assessment & Plan Note (Signed)
Summary: PER DAHLIA  D/T---STC   Vital Signs:  Patient profile:   70 year old male Height:      68 inches (172.72 cm) Weight:      224.25 pounds (101.93 kg) O2 Sat:      97 % on Room air Temp:     97.3 degrees F (36.28 degrees C) oral Pulse rate:   70 / minute BP sitting:   130 / 60  (left arm) Cuff size:   large  Vitals Entered By: Josph Macho CMA (February 12, 2009 4:08 PM)  O2 Flow:  Room air CC: Follow-up visit/ Flu vax/ CF Is Patient Diabetic? Yes   Primary Provider:  Norins  CC:  Follow-up visit/ Flu vax/ CF.  History of Present Illness: no cbg record, but states cbg's are often low if lunch is delayed.  it is not low at other times of day.  he says it is otherwise well-controlled.  he says in general, cbg is slightly higher in the afternoon than in am.  pt states he feels well in general.    Current Medications (verified): 1)  Simvastatin 40 Mg  Tabs (Simvastatin) .... Q Pm 2)  Bayer Aspirin 325 Mg  Tabs (Aspirin) .... Once Daily 3)  Ranitidine Hcl 75 Mg  Tabs (Ranitidine Hcl) .... Three Times A Day 4)  Carvedilol 25 Mg  Tabs (Carvedilol) .... Take 1 1/2 Tab Two Times A Day 5)  Klor-Con M20 20 Meq  Tbcr (Potassium Chloride Crys Cr) .... 2 in Am, 1 in Pm 6)  Relion Humulin 70/30 70-30 %  (Insulin Isophane & Regular) .... 20units Am/ 10 Units Pm 7)  Furosemide 20 Mg  Tabs (Furosemide) .... 2 in Am/ 1 in Pm 8)  Benicar 40 Mg  Tabs (Olmesartan Medoxomil) .... By Mouth Once Daily 9)  Metformin Hcl 1000 Mg  Tabs (Metformin Hcl) .... Two Times A Day 10)  Freestyle Lite Test  Strp (Glucose Blood) .... Two Times A Day, 250.01, and Lancets 11)  Flomax 0.4 Mg Cp24 (Tamsulosin Hcl) .... Take 1 Capsule By Mouth Nightly 12)  Avodart 0.5 Mg Caps (Dutasteride) .Marland Kitchen.. 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    Physical Exam  General:  obese.  no distress  Pulses:  dorsalis pedis intact bilat.   Extremities:  no deformity.  no ulcer on the feet.  feet are of normal color and temp.  trace right pedal edema and trace left pedal edema.   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 21 units am and 9 units before the evening meal. 2)  Humulin 70/30 70-30 % Susp (Insulin isophane & regular) .Marland Kitchen.. 19 units am and 9 units before the evening meal.  Other Orders: Flu Vaccine 72yrs + (60454) Administration Flu vaccine - MCR (G0008) TLB-A1C / Hgb A1C (Glycohemoglobin) (83036-A1C) TLB-Microalbumin/Creat Ratio, Urine (82043-MALB) Est. Patient Level III (09811) Flu Vaccine Consent Questions     Do you have a history of severe allergic reactions to this vaccine? no    Any prior history of allergic reactions to egg and/or gelatin? no    Do you have a sensitivity to the  preservative Thimersol? no    Do you have a past history of Guillan-Barre Syndrome? no    Do you currently have an acute febrile illness? no    Have you ever had a severe reaction to latex? no    Vaccine information given and explained to patient? yes    Are you currently pregnant? no    Lot Number:[id number]   Exp Date:08/05/2009   Site Given  Left Deltoid IM Administration Flu vaccine - MCR (B1478)  Patient Instructions: 1)  on this type of insulin, meals should not be delayed. 2)  pending the test results, change humulin 70/30 to 21 units am and 9 units pm. 3)  check your blood sugar 2 times a day.  vary the time of day when you check, between before the 3 meals, and at bedtime.  also check if you have symptoms of your blood sugar being too high or too low.  please keep a record of the readings and bring it to your next appointment here.  please call us sooner if you are having low blood sugar episodes. 4)  tests are being ordered for you today.  a few days after the test(s), please call [phone] to hear your test results. 5)  Please schedule a follow-up appointment in 3 months. 6)  (update: i left message on phone-tree:  take humulin 70/30, just 19 units am and 9 units pm.  i told pt that the rx of microalbminuria is control of dm, bp, and cholesterol) Prescriptions: HUMULIN 70/30 70-30 % SUSP (INSULIN ISOPHANE & REGULAR) 21 units am and 9 units before the evening meal.  #3 vials x 3   Entered and Authorized by:   Minus Breeding MD   Signed by:   Minus Breeding MD on 02/12/2009   Method used:   Electronically to        [facility] (retail)       [address]       [place], [place]  [address]       Ph: [phone]       Fax: [phone]   RxID:   [id number]  .lbmedflu

## 2010-03-08 NOTE — Assessment & Plan Note (Signed)
Summary: PER  WIFE  FU--STC   Vital Signs:  Patient profile:   70 year old male Height:      68 inches (172.72 cm) Weight:      223 pounds (101.36 kg) O2 Sat:      95 % on Room air Temp:     99.3 degrees F (37.39 degrees C) oral Pulse rate:   70 / minute BP sitting:   140 / 70  (left arm) Cuff size:   large  Vitals Entered By: Josph Macho RMA (April 27, 2009 2:27 PM)  O2 Flow:  Room air CC: Follow-up visit on Metformin/ pt states he quit taking Metformin- was upsetting stomach/ CF Is Patient Diabetic? Yes   Primary Provider:  Norins  CC:  Follow-up visit on Metformin/ pt states he quit taking Metformin- was upsetting stomach/ CF.  History of Present Illness: he stopped metformin due to diarrhea. when he noted his cbg had increased, he increased insulin to 30 units am and 20 units pm.  no cbg record, but states cbg's are well-controlled, but lightly higher later in the day.  pt states he feels well in general, and the diarrhea resolved with discontnuation of the metformin.  Current Medications (verified): 1)  Simvastatin 40 Mg  Tabs (Simvastatin) .... Q Pm 2)  Bayer Aspirin 325 Mg  Tabs (Aspirin) .... Once Daily 3)  Ranitidine Hcl 75 Mg  Tabs (Ranitidine Hcl) .... Three Times A Day 4)  Carvedilol 25 Mg  Tabs (Carvedilol) .... Take 1 1/2 Tab Two Times A Day 5)  Klor-Con M20 20 Meq  Tbcr (Potassium Chloride Crys Cr) .... 2 in Am, 1 in Pm 6)  Furosemide 20 Mg  Tabs (Furosemide) .... 2 in Am/ 1 in Pm 7)  Benicar 40 Mg  Tabs (Olmesartan Medoxomil) .... By Mouth Once Daily 8)  Metformin Hcl 1000 Mg  Tabs (Metformin Hcl) .... Two Times A Day 9)  Freestyle Lite Test  Strp (Glucose Blood) .... Two Times A Day, 250.01, and Lancets 10)  Flomax 0.4 Mg Cp24 (Tamsulosin Hcl) .... Take 1 Capsule By Mouth Nightly 11)  Avodart 0.5 Mg Caps (Dutasteride) .Marland Kitchen.. 1 By Mouth Daily 12)  Humulin 70/30 70-30 % Susp (Insulin Isophane & Regular) .Marland Kitchen.. 19 Units Am and 9 Units Before The Evening  Meal.  Allergies (verified): 1)  ! 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    Physical Exam  General:  obese.  no distress  Neck:  Supple without thyroid enlargement or tenderness.  Additional Exam:  Hemoglobin A1C       [H]  7.0 %    Impression & Recommendations:  Problem # 1:  DM, UNCOMPLICATED, TYPE I, UNCONTROLLED (ICD-250.03) well-controlled  Problem # 2:  diarrhea due to metformin  Medications Added to Medication List This Visit: 1)  Humulin 70/30 70-30 % Susp (Insulin isophane & regular) .... 30 units am and 20 units before the evening meal.  Other Orders: TLB-A1C / Hgb A1C (Glycohemoglobin) (83036-A1C) Est. Patient Level III (04540)  Patient Instructions: 1)  check your blood sugar 2 times a day.  vary the time of day when you check, between before the 3 meals, and at bedtime.  also check if you have symptoms of your blood sugar being too high or too low.  please keep a record of the readings and bring it to your next appointment here.  please call us sooner if you are having low blood sugar episodes. 2)  tests are being ordered for you today.  a few days after the test(s), please call [phone] to hear your test results. 3)  Please schedule a follow-up appointment in 3 months. 4)  continue humulin 70/30,   30 units am and 20 units pm. Prescriptions: HUMULIN 70/30 70-30 % SUSP (INSULIN ISOPHANE & REGULAR) 30 units am and 20 units before the evening meal.  #2 vials x 11   Entered and Authorized by:   Minus Breeding MD   Signed by:   Minus Breeding MD on 04/27/2009   Method used:   Electronically to        [facility] (retail)       [address]       [place], [place]  [address]       Ph: [phone]       Fax: [phone]   RxID:   [id number]

## 2010-03-10 NOTE — Assessment & Plan Note (Signed)
Summary: post discharge [facility]/cd   Vital Signs:  Patient profile:   70 year old male Height:      68 inches Weight:      223 pounds BMI:     34.03 O2 Sat:      96 % on Room air Temp:     97.8 degrees F oral Pulse rate:   70 / minute BP sitting:   128 / 66  (left arm) Cuff size:   large  Vitals Entered By: Bill Salinas CMA (January 21, 2010 3:21 PM)  O2 Flow:  Room air  Primary Care Provider:  Norins   History of Present Illness: Patient presents for folllow-up. 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 His storke was in May '11 and he was discharged to skilled care until end of November '11. At this point he has a dense hemiparesis right and is w/c bound; can feed himself, needs assist with bathing and dressing. He has been stable in regard to pulmonary and cardiac condition. He reports, wife confirms, that his blood sugar has been doing well with some low readings. Wife is his primary care-taker. He does have home health PT and OT.  Current Medications (verified): 1)  Simvastatin 40 Mg  Tabs (Simvastatin) .... Q Pm 2)  Carvedilol 25 Mg  Tabs (Carvedilol) .... Take  1/2 Tab Two Times A Day 3)  Flomax 0.4 Mg Cp24 (Tamsulosin Hcl) .... Take 1 Capsule By Mouth Nightly 4)  Novolog 100 Unit/ml Soln (Insulin Aspart) .... Uad 5)  Lantus 100 Unit/ml Soln (Insulin Glargine) .... Uad 6)  Proscar 5 Mg Tabs (Finasteride) .... Take One Tablet By Mouth Once Daily. 7)  Hydralazine Hcl 25 Mg Tabs (Hydralazine Hcl) .... Take One Tablet By Mouth Three Times A Day 8)  Famotidine 20 Mg Tabs (Famotidine) .... 1/2 Tablet Three Times A Day 9)  Ventolin Hfa 108 (90 Base) Mcg/act Aers (Albuterol Sulfate) .... Uad 10)  Furosemide 40 Mg Tabs (Furosemide) .... Take One Tablet By Mouth Daily. 11)  Simvastatin 40 Mg Tabs  (Simvastatin) .... One By Mouth Daily 12)  Sertraline Hcl 25 Mg Tabs (Sertraline Hcl) .... One By Mouth Daily For 2 Weeks Then Increase To 50mg  Daily 13)  Zantac 75 75 Mg Tabs (Ranitidine Hcl) .Marland Kitchen.. 1 Two Times A Day 14)  Clonidine Hcl 0.2 Mg Tabs (Clonidine Hcl) .Marland Kitchen.. 1 Tab Two Times A Day 15)  Avodart 0.5 Mg Caps (Dutasteride) .Marland Kitchen.. 1 Capsule Once Daily 16)  Klor-Con 20 Meq Pack (Potassium Chloride) .Marland Kitchen.. 1 Two Times A Day  Allergies (verified): 1)  ! 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) IMPLANTATION OF DEFIBRILLATOR, HX OF (ICD-V45.02) CONGESTIVE HEART FAILURE (ICD-428.0) ABDOMINAL PAIN (ICD-789.00) URINARY RETENTION (ICD-788.20) PNEUMONIA (ICD-486) COUGH (ICD-786.2) ACUTE BRONCHITIS (ICD-466.0) CONTUSION OF FOOT (ICD-924.20) OTHER SIGN AND SYMPTOM IN BREAST (ICD-611.79) FIBROMYALGIA (ICD-729.1) HYPERLIPIDEMIA (ICD-272.4) CORONARY ARTERY DISEASE (ICD-414.00) GERD (ICD-530.81) DM, UNCOMPLICATED, TYPE I, UNCONTROLLED (ICD-250.03) HYPERTENSION, BENIGN ESSENTIAL (ICD-401.1)    Past Surgical History: Last updated: 07/04/2008 * BACK SURGERY TONSILLECTOMY, HX OF (ICD-V45.79)  Family History: Last updated: 2007-12-06 father-deceased @70 's: died in a fire mother-deceased @ 72's: CAD/MI Brothers with colon cancer. 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  The patient denies anorexia, fever, vision loss, decreased hearing, chest pain, syncope, dyspnea on exertion, peripheral edema, headaches, abdominal pain, abnormal bleeding, and enlarged lymph nodes.    Physical Exam  General:  awake  and alert, in a w/c Head:  normocephalic and atraumatic.   Eyes:  C&S clear Neck:  supple, full ROM, and no thyromegaly.   Chest Wall:  no deformities and no tenderness.   Lungs:  normal respiratory effort, normal breath sounds, no crackles, and no wheezes.   Heart:  normal rate, regular rhythm, no murmur, and no JVD.   Abdomen:  soft, non-tender, and normal bowel sounds.   Msk:  normal ROM, no joint tenderness, no joint swelling, and no joint deformities.   Neurologic:  alert & oriented X3 and cranial nerves II-XII intact.  o/5 right shoulder, 2/5 elbow, 2/5 grip, o/5 proximal right LE, 1/5 ankle with dorsoflexion.  Skin:  turgor normal, no rashes, and no suspicious lesions.   Cervical Nodes:  no anterior cervical adenopathy and no posterior cervical adenopathy.   Psych:  Oriented X3 and memory intact for recent and remote.     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 He continues to receive home health PT/OT.  Plan - continued treatment and prevention.  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 He is current with cardiology for follow-up.  His updated medication list for this problem includes:    Carvedilol 25 Mg Tabs (Carvedilol) .Marland Kitchen... Take  1/2 tab two times a day    Hydralazine Hcl 25 Mg Tabs (Hydralazine hcl) .Marland Kitchen... Take one tablet by mouth three times a day    Furosemide 40 Mg Tabs (Furosemide) .Marland Kitchen... Take one tablet by mouth daily.    Clonidine Hcl 0.2 Mg Tabs (Clonidine hcl) .Marland Kitchen... 1 tab two times a day  Problem # 3:  HYPERLIPIDEMIA (ICD-272.4) Reviwed eChart labs: lipid panel July 08, 2009  cholesterol 119, HDL 24, LDL 65  Plan - continue persent medication  The following medications were removed from the medication list:     Simvastatin 40 Mg Tabs (Simvastatin) ..... One by mouth daily His updated medication list for this problem includes:    Simvastatin 40 Mg Tabs (Simvastatin) ..... Q pm  Problem # 4:  DM, UNCOMPLICATED, TYPE I, UNCONTROLLED (ICD-250.03) His regimen is not certain - wife isn't sure of his home medications. Last A1C reviewed in eChart = 8.1% Jul 06, 2009.  Plan - confirm home medications           will need A1C at next office visit.   The following medications were removed from the medication list:    Novolog 100 Unit/ml Soln (Insulin aspart) ..... Uad His updated medication list for this problem includes:    Lantus 100 Unit/ml Soln (Insulin glargine) .Marland Kitchen... 20 units at bedtime  Problem # 5:  HYPERTENSION, BENIGN ESSENTIAL (ICD-401.1)  His updated medication list for this problem includes:    Carvedilol 25 Mg Tabs (Carvedilol) .Marland Kitchen... Take  1/2 tab two times a day    Hydralazine Hcl 25 Mg Tabs (Hydralazine hcl) .Marland Kitchen... Take one  tablet by mouth three times a day    Furosemide 40 Mg Tabs (Furosemide) .Marland Kitchen... Take one tablet by mouth daily.    Clonidine Hcl 0.2 Mg Tabs (Clonidine hcl) .Marland Kitchen... 1 tab two times a day  BP today: 128/66 Prior BP: 148/76 (06/02/2009)  Good BP control. Wife will verify medications and will bring his med list up to date as appropriate.  Complete Medication List: 1)  Simvastatin 40 Mg Tabs (Simvastatin) .... Q pm 2)  Carvedilol 25 Mg Tabs (Carvedilol) .... Take  1/2 tab two times a day 3)  Flomax 0.4 Mg Cp24 (Tamsulosin hcl) .... Take 1 capsule by mouth nightly 4)  Lantus 100 Unit/ml Soln (Insulin glargine) .... 20 units at bedtime 5)  Proscar 5 Mg Tabs (Finasteride) .... Take one tablet by mouth once daily. 6)  Hydralazine Hcl 25 Mg Tabs (Hydralazine hcl) .... Take one tablet by mouth three times a day 7)  Famotidine 20 Mg Tabs (Famotidine) .... 1/2 tablet three times a day 8)  Furosemide 40 Mg Tabs (Furosemide) .... Take one tablet by mouth daily. 9)  Sertraline Hcl  50 Mg Tabs (Sertraline hcl) .Marland Kitchen.. 1 by mouth once daily 10)  Clonidine Hcl 0.2 Mg Tabs (Clonidine hcl) .Marland Kitchen.. 1 tab two times a day 11)  Klor-con 20 Meq Pack (Potassium chloride) .Marland Kitchen.. 1 two times a day  Patient Instructions: 1)  please check medlist against home meds and be sure we have an accurate list. 2)  Continue all present meds 3)  return n 2-3 weeks for follow-up bring blood sugar readings.    Orders Added: 1)  Est. Patient Level IV [[address]]

## 2010-03-10 NOTE — Progress Notes (Signed)
  Phone Note Call from Patient   Summary of Call: SPoke w/wife. Pt c/o blurred vision off and on, itching and rash on right arm, more sleepy than normal started saturday. This am c/o h/a in the back of head, left arm numbness and continued blurred vision.   OK TO WORK IN?  Initial call taken by: Lamar Sprinkles, CMA,  February 21, 2010 12:25 PM  Follow-up for Phone Call        OK for work in since we have passed the 4 hour window after new symptom onset re: emergency thrombolytic therapy.  Follow-up by: Jacques Navy MD,  February 21, 2010 1:16 PM  Additional Follow-up for Phone Call Additional follow up Details #1::        Being seen shortly Additional Follow-up by: Lamar Sprinkles, CMA,  February 21, 2010 1:53 PM

## 2010-03-10 NOTE — Progress Notes (Signed)
Summary: OT   Phone Note Other Incoming   Caller: Lincoln Maxin- [facility] [phone] Summary of Call: Needs an order or OT twice a week for 6 weeks verbal order ok.  Initial call taken by: Ami Bullins CMA,  February 09, 2010 5:02 PM  Follow-up for Phone Call        ok for OT as requested Follow-up by: Jacques Navy MD,  February 10, 2010 1:01 PM  Additional Follow-up for Phone Call Additional follow up Details #1::        left mess for therapist  to call office back.....................Marland KitchenLamar Sprinkles, CMA  February 10, 2010 1:47 PM   Therapist informed Additional Follow-up by: Lamar Sprinkles, CMA,  February 14, 2010 10:03 AM

## 2010-03-10 NOTE — Progress Notes (Signed)
Summary: THERAPY Update  Phone Note From Other Clinic   Summary of Call: Physical Therapist was in pt's home today. Pt c/o sleepyness. Bp 100/50, HR 68 & O2 95%. Pt was advised to check cbg's & BP later today & call his PCP w/any further problems. Pt also has increased stress - son was just admitted to the hosptial for heart problems.  Initial call taken by: Lamar Sprinkles, CMA,  February 03, 2010 10:54 AM  Follow-up for Phone Call        noted Follow-up by: Jacques Navy MD,  February 03, 2010 12:54 PM

## 2010-03-10 NOTE — Progress Notes (Signed)
Summary: Blood Glucose readings from patient  Blood Glucose readings from patient   Imported By: Lester [place] 02/24/2010 10:01:43  _____________________________________________________________________  External Attachment:    Type:   Image     Comment:   External Document

## 2010-03-10 NOTE — Miscellaneous (Signed)
Summary: [facility]  Orders/[facility]   Imported By: Lester Villalba 01/24/2010 10:00:17  _____________________________________________________________________  External Attachment:    Type:   Image     Comment:   External Document

## 2010-03-10 NOTE — Letter (Signed)
Summary: Device-Delinquent Phone Journalist, newspaper, Main Office  [address]   [place], [place] [address]   Phone: [phone]  Fax: [phone]     February 02, 2010 MRN: [id number]   [employer] [facility] [address] [place], [place]  [address]   Dear Mr. Deahl,  According to our records, you were scheduled for a device phone transmission on 11-04-2009.     We did not receive any results from this check.  If you transmitted on your scheduled day, please call us to help troubleshoot your system.  If you forgot to send your transmission, please send one upon receipt of this letter.  Thank you,  Vella Kohler  February 02, 2010 3:38 PM   [facility] certified

## 2010-03-10 NOTE — Miscellaneous (Signed)
Summary: Order for auto hosp bed/[facility]  Order for auto hosp bed/[facility]   Imported By: Sherian Rein 02/23/2010 10:56:57  _____________________________________________________________________  External Attachment:    Type:   Image     Comment:   External Document

## 2010-03-10 NOTE — Progress Notes (Signed)
Summary: ORDERS  Phone Note Call from Patient Call back at Home Phone [phone]   Caller: Patient Reason for Call: Talk to Nurse Summary of Call: REQUEST ORDER FOR RT LEG BRACE (WRITTEN ORDER)  AUTOMATIC BED, PLEASE SEND ORDER TO [facility] Initial call taken by: Migdalia Dk,  February 17, 2010 2:57 PM  Follow-up for Phone Call        signed and done Follow-up by: Jacques Navy MD,  February 17, 2010 3:35 PM

## 2010-03-10 NOTE — Miscellaneous (Signed)
Summary: PT orders/[facility]  PT orders/[facility]   Imported By: Sherian Rein 02/17/2010 14:54:44  _____________________________________________________________________  External Attachment:    Type:   Image     Comment:   External Document

## 2010-03-10 NOTE — Miscellaneous (Signed)
Summary: Care Plans/[facility]  Care Plans/[facility]   Imported By: Sherian Rein 02/04/2010 12:19:37  _____________________________________________________________________  External Attachment:    Type:   Image     Comment:   External Document

## 2010-03-10 NOTE — Assessment & Plan Note (Signed)
Summary: blurred vision, h/a, left arm numbness/SD   Vital Signs:  Patient profile:   70 year old male Height:      68 inches Weight:      223 pounds BMI:     34.03 O2 Sat:      99 % on Room air Temp:     97.6 degrees F oral Pulse rate:   59 / minute BP sitting:   142 / 72  (left arm) Cuff size:   large  Vitals Entered By: Bill Salinas CMA (February 21, 2010 2:11 PM)  O2 Flow:  Room air CC: pt here for evaluation of numbness/ tingling in left arm with blurred vision and headache that he exp. this am. Pt denies chest pain or SOB/ ab   Primary Care Provider:  [facility]  CC:  pt here for evaluation of numbness/ tingling in left arm with blurred vision and headache that he exp. this am. Pt denies chest pain or SOB/ ab.  History of Present Illness: Patient presents for evaluation of symptoms suggestive of a new CVA  Patient was in his usual state of health but last night he had an occipital headache and some blurred vision left eye. This morning his left arm was broken out in a rash, he had numbness and weakness in the left arm and his vision in the left eye was blurred. He has  been taking his blood pressure medications. He has not been on any antiplatelet agent.  Early morning CBGs (reviewed patients record) have generally been controlled but there are low readings in the 50's. the patient retires about 1700-1800 hrs and doesnt eat until the next morning at 0600 - 0700 hrs.   Current Medications (verified): 1)  Simvastatin 40 Mg  Tabs (Simvastatin) .... Q Pm 2)  Carvedilol 25 Mg  Tabs (Carvedilol) .... Take  1/2 Tab Two Times A Day 3)  Flomax 0.4 Mg Cp24 (Tamsulosin Hcl) .... Take 1 Capsule By Mouth Nightly 4)  Lantus 100 Unit/ml Soln (Insulin Glargine) .... 20 Units At Bedtime 5)  Proscar 5 Mg Tabs (Finasteride) .... Take One Tablet By Mouth Once Daily. 6)  Hydralazine Hcl 25 Mg Tabs (Hydralazine Hcl) .... Take One Tablet By Mouth Three Times A Day 7)  Famotidine 20 Mg Tabs  (Famotidine) .... 1/2 Tablet Three Times A Day 8)  Furosemide 40 Mg Tabs (Furosemide) .... Take One Tablet By Mouth Daily. 9)  Sertraline Hcl 50 Mg Tabs (Sertraline Hcl) .Marland Kitchen.. 1 By Mouth Once Daily 10)  Clonidine Hcl 0.2 Mg Tabs (Clonidine Hcl) .Marland Kitchen.. 1 Tab Two Times A Day 11)  Klor-Con 20 Meq Pack (Potassium Chloride) .Marland Kitchen.. 1 Two Times A Day  Allergies (verified): 1)  ! 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) IMPLANTATION OF DEFIBRILLATOR, HX OF (ICD-V45.02) CONGESTIVE HEART FAILURE (ICD-428.0) ABDOMINAL PAIN (ICD-789.00) URINARY RETENTION (ICD-788.20) PNEUMONIA (ICD-486) COUGH (ICD-786.2) ACUTE BRONCHITIS (ICD-466.0) CONTUSION OF FOOT (ICD-924.20) OTHER SIGN AND SYMPTOM IN BREAST (ICD-611.79) FIBROMYALGIA (ICD-729.1) HYPERLIPIDEMIA (ICD-272.4) CORONARY ARTERY DISEASE (ICD-414.00) GERD (ICD-530.81) DM, UNCOMPLICATED, TYPE I, UNCONTROLLED (ICD-250.03) HYPERTENSION, BENIGN ESSENTIAL (ICD-401.1)    Past Surgical History: Last updated: 07/04/2008 * BACK SURGERY TONSILLECTOMY, HX OF (ICD-V45.79)  Family History: Last updated: 2007-12-09 father-deceased @70 's: died in a fire mother-deceased @ 81's: CAD/MI Brothers with colon cancer. 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  The patient denies anorexia, fever, weight loss, weight gain, decreased hearing, hoarseness, chest pain, syncope, dyspnea on exertion, peripheral edema, abdominal pain, severe indigestion/heartburn, transient blindness, unusual weight change, abnormal bleeding, and enlarged lymph nodes.    Physical Exam  General:  WNWD man in no distress in a w/c with  right hemiparesis from old CVA. He is alert and coopeative for the exam.  Head:  normocephalic and atraumatic.   Eyes:  well preserved peripheral vision left eye. Vision is clear.  Neck:  supple.   Lungs:  normal respiratory effort and normal breath sounds.   Heart:  normal rate and regular rhythm.   Abdomen:  soft, non-tender, normal bowel sounds, no guarding, and no rigidity.   Msk:  right hand in a glove. Neurologic:  alert & oriented X3.  Normal facial movement and symmetry. MS left grip  - nl, distal and proximal strength left UE normal, sensation is preserved to light touch.  Speech is as clear as usual. EOMI in tact. Vision is normal as noted above.  Skin:  left arm without rash. Cervical Nodes:  no anterior cervical adenopathy and no posterior cervical adenopathy.   Psych:  Oriented X3, good eye contact, and not anxious appearing.     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   Plan - start aggrenox two times a day.           continue current risk modification.  Problem # 2:  DM, UNCOMPLICATED, TYPE I, UNCONTROLLED (ICD-250.03) Patient with some low AM CBGs mostly likely due to long fast between time of retiring and bkfst.  Plan - continue lantus 20 units once a day          be sure ot have a snack at 2230-2300 hours.  His updated medication list for this problem includes:    Lantus 100 Unit/ml Soln (Insulin glargine) .Marland Kitchen... 20 units at bedtime  Complete Medication List: 1)  Simvastatin 40 Mg Tabs (Simvastatin) .... Q pm 2)  Carvedilol 25 Mg Tabs (Carvedilol) .... Take  1/2 tab two times a day 3)  Flomax 0.4 Mg Cp24 (Tamsulosin hcl) .... Take 1 capsule by mouth nightly 4)  Lantus 100 Unit/ml Soln (Insulin glargine) .... 20 units at bedtime 5)  Proscar 5 Mg Tabs (Finasteride) .... Take one tablet by mouth once daily. 6)  Hydralazine Hcl 25  Mg Tabs (Hydralazine hcl) .... Take one tablet by mouth three times a day 7)  Famotidine 20 Mg Tabs (Famotidine) .... 1/2 tablet three times a day 8)  Furosemide 40 Mg Tabs (Furosemide) .... Take one tablet by mouth daily. 9)  Sertraline Hcl 50 Mg Tabs (Sertraline hcl) .Marland Kitchen.. 1 by mouth once daily 10)  Clonidine Hcl 0.2 Mg Tabs (Clonidine hcl) .Marland Kitchen.. 1 tab two times a day 11)  Klor-con 20 Meq Pack (Potassium chloride) .Marland Kitchen.. 1 two times a day   Orders Added: 1)  Est. Patient Level III [41324]

## 2010-03-10 NOTE — Progress Notes (Signed)
Phone Note Call from Patient   Summary of Call: Male called about pt, she stated that pt's meds are all the same as the list we have. Pt needs refills.  Initial call taken by: Lamar Sprinkles, CMA,  January 25, 2010 4:47 PM  Follow-up for Phone Call        ok for refills based on final med list at last visit - sent to [facility] Follow-up by: Jacques Navy MD,  January 25, 2010 5:17 PM    Prescriptions: KLOR-CON 20 MEQ PACK (POTASSIUM CHLORIDE) 1 two times a day  #12 x 0   Entered and Authorized by:   Jacques Navy MD   Signed by:   Jacques Navy MD on 01/25/2010   Method used:   Electronically to        [facility] (retail)       [address]       [place], [place]  [address]       Ph: [phone]       Fax: [phone]   RxID:   [id number] CLONIDINE HCL 0.2 MG TABS (CLONIDINE HCL) 1 tab two times a day  #60 x 12   Entered and Authorized by:   Jacques Navy MD   Signed by:   Jacques Navy MD on 01/25/2010   Method used:   Electronically to        [facility] (retail)       [address]       [place], [place]  [address]       Ph: [phone]       Fax: [phone]   RxID:   [id number] SERTRALINE HCL 50 MG TABS (SERTRALINE HCL) 1 by mouth once daily  #30 x 12   Entered and Authorized by:   Jacques Navy MD   Signed by:   Jacques Navy MD on 01/25/2010   Method used:   Electronically to        [facility] (retail)       [address]       [place], [place]  [address]       Ph: [phone]       Fax: [phone]   RxID:   [id number] FUROSEMIDE 40 MG TABS (FUROSEMIDE) Take one tablet by mouth daily.  #30 x 12   Entered and Authorized by:   Jacques Navy MD   Signed by:   Jacques Navy MD on 01/25/2010   Method used:   Electronically to        [facility] (retail)       [address]       [place], [place]  [address]       Ph: [phone]       Fax: [phone]   RxID:   [id number] HYDRALAZINE HCL 25 MG TABS (HYDRALAZINE HCL) Take one tablet by mouth three times a day  #90 x 12   Entered and Authorized by:   Jacques Navy MD   Signed by:   Jacques Navy MD on 01/25/2010   Method used:   Electronically to        [facility] (retail)       [address]  [place], [place]  [address]       Ph: [phone]       Fax: [phone]   RxID:   [id number] PROSCAR 5 MG TABS (FINASTERIDE) Take one tablet by mouth once daily.  #30 x 12   Entered and Authorized by:   Jacques Navy MD   Signed by:   Jacques Navy MD on 01/25/2010   Method used:   Electronically to        [facility] (retail)       [address]       [place], [place]  [address]       Ph: [phone]       Fax: [phone]   RxID:   [id number] LANTUS 100 UNIT/ML SOLN (INSULIN GLARGINE) 20 units at bedtime  #2 bottles x 12   Entered and Authorized by:   Jacques Navy MD   Signed by:   Jacques Navy MD on 01/25/2010   Method used:   Electronically to        [facility] (retail)       [address]       [place], [place]  [address]       Ph: [phone]       Fax: [phone]   RxID:   [id number] FLOMAX 0.4 MG CP24 (TAMSULOSIN HCL) Take 1 capsule by mouth nightly  #90 Each x 2   Entered and Authorized by:   Jacques Navy MD   Signed by:   Jacques Navy MD on 01/25/2010   Method used:   Electronically to        [facility] (retail)       [address]       [place], [place]  [address]       Ph: [phone]       Fax: [phone]   RxID:   [id number] CARVEDILOL 25 MG  TABS (CARVEDILOL) TAKE  1/2 TAB two times a day  #30 x 12   Entered and Authorized by:   Jacques Navy MD   Signed by:   Jacques Navy MD on 01/25/2010   Method used:    Electronically to        [facility] (retail)       [address]       [place], [place]  [address]       Ph: [phone]       Fax: [phone]   RxID:   [id number] SIMVASTATIN 40 MG  TABS (SIMVASTATIN) q PM  #30 Each x 1   Entered and Authorized by:   Jacques Navy MD   Signed by:   Jacques Navy MD on 01/25/2010   Method used:   Electronically to        [facility] (retail)       [address]       [place], [place]  [address]       Ph: [phone]       Fax: [phone]   RxID:   [id number]

## 2010-03-10 NOTE — Miscellaneous (Signed)
Summary: Care Plans/[facility]  Care Plans/[facility]   Imported By: Sherian Rein 01/21/2010 07:41:51  _____________________________________________________________________  External Attachment:    Type:   Image     Comment:   External Document

## 2010-03-10 NOTE — Cardiovascular Report (Signed)
Summary: Certified Letter Signed - Other (Not doing f/u)  Certified Letter Signed - Other (Not doing f/u)   Imported By: Debby Freiberg 02/23/2010 16:06:46  _____________________________________________________________________  External Attachment:    Type:   Image     Comment:   External Document

## 2010-03-10 NOTE — Procedures (Signed)
Summary: [facility]  [facility]   Imported By: Lester Mondamin 02/23/2010 08:25:43  _____________________________________________________________________  External Attachment:    Type:   Image     Comment:   External Document

## 2010-03-11 NOTE — Cardiovascular Report (Signed)
Summary: Office Visit   Office Visit   Imported By: Roderic Ovens 06/08/2009 16:47:01  _____________________________________________________________________  External Attachment:    Type:   Image     Comment:   External Document

## 2010-03-16 NOTE — Letter (Signed)
Summary: [facility]  [facility]   Imported By: Sherian Rein 03/07/2010 07:09:00  _____________________________________________________________________  External Attachment:    Type:   Image     Comment:   External Document

## 2010-03-16 NOTE — Progress Notes (Signed)
Summary: Wheelchair RX  Phone Note From Other Clinic   Caller: Lincoln Maxin -THERAPIST [phone] Summary of Call: Therapist from [facility] called - They are req rx for a one arm drive wheelchair faxed to [facility] - [phone]. OK? Initial call taken by: Lamar Sprinkles, CMA,  March 08, 2010 2:58 PM  Follow-up for Phone Call        done Follow-up by: Jacques Navy MD,  March 08, 2010 6:01 PM  Additional Follow-up for Phone Call Additional follow up Details #1::        to be faxed tomorrow Additional Follow-up by: Lamar Sprinkles, CMA,  March 08, 2010 6:03 PM

## 2010-03-16 NOTE — Miscellaneous (Signed)
Summary: Revision to Plan of Care / [facility]  Revision to Plan of Care / [facility]   Imported By: Lennie Odor 03/10/2010 16:11:21  _____________________________________________________________________  External Attachment:    Type:   Image     Comment:   External Document

## 2010-03-19 ENCOUNTER — Encounter: Payer: Self-pay | Admitting: Internal Medicine

## 2010-03-23 ENCOUNTER — Telehealth (INDEPENDENT_AMBULATORY_CARE_PROVIDER_SITE_OTHER): Payer: Self-pay | Admitting: *Deleted

## 2010-03-23 ENCOUNTER — Encounter: Payer: Self-pay | Admitting: Internal Medicine

## 2010-03-23 DIAGNOSIS — I69969 Other paralytic syndrome following unspecified cerebrovascular disease affecting unspecified side: Secondary | ICD-10-CM

## 2010-03-24 NOTE — Miscellaneous (Signed)
Summary: Wheelchair/[facility]  Wheelchair/[facility]   Imported By: Sherian Rein 03/16/2010 12:24:46  _____________________________________________________________________  External Attachment:    Type:   Image     Comment:   External Document

## 2010-03-30 NOTE — Miscellaneous (Signed)
Summary: Care Plan/[facility]  Care Plan/[facility]   Imported By: Sherian Rein 03/23/2010 12:23:56  _____________________________________________________________________  External Attachment:    Type:   Image     Comment:   External Document

## 2010-03-30 NOTE — Miscellaneous (Signed)
Summary: [facility]  [facility]   Imported By: Sherian Rein 03/25/2010 10:15:24  _____________________________________________________________________  External Attachment:    Type:   Image     Comment:   External Document

## 2010-03-30 NOTE — Progress Notes (Signed)
Summary: need a device   Phone Note Call from Patient Call back at [phone]   Caller: Spouse/patricia Reason for Call: Talk to Nurse Summary of Call: pt wife states she sent device back because she did not know it was a upgrade device. pt wife states can he get a new deivce sent to him  Initial call taken by: Roe Coombs,  March 23, 2010 12:03 PM  Follow-up for Phone Call        will have a new Latitude transmitter sent out to the patient.  he will download every thursday. Follow-up by: Altha Harm, LPN,  March 23, 2010 5:10 PM

## 2010-04-04 ENCOUNTER — Telehealth: Payer: Self-pay | Admitting: Internal Medicine

## 2010-04-08 ENCOUNTER — Telehealth: Payer: Self-pay | Admitting: Internal Medicine

## 2010-04-11 ENCOUNTER — Encounter: Payer: Self-pay | Admitting: Internal Medicine

## 2010-04-13 ENCOUNTER — Encounter: Payer: Self-pay | Admitting: Internal Medicine

## 2010-04-14 NOTE — Progress Notes (Signed)
Summary: potassium  Phone Note Call from Patient Call back at [facility] Phone [phone]   Summary of Call: Pt wants to know why rx for potassium was only for 12 pills. This was original rx qty. Should pt be taking med two times a day every day?  Initial call taken by: Lamar Sprinkles, CMA,  April 04, 2010 11:48 AM  Follow-up for Phone Call        that was an error should be 60 tabs per month, sig 1 by mouth two times a day. Follow-up by: Jacques Navy MD,  April 04, 2010 1:23 PM  Additional Follow-up for Phone Call Additional follow up Details #1::        Pt informed  Additional Follow-up by: Lamar Sprinkles, CMA,  April 04, 2010 5:20 PM    New/Updated Medications: KLOR-CON M20 20 MEQ CR-TABS (POTASSIUM CHLORIDE CRYS CR) 1 two times a day Prescriptions: KLOR-CON M20 20 MEQ CR-TABS (POTASSIUM CHLORIDE CRYS CR) 1 two times a day  #60 x 6   Entered by:   Lamar Sprinkles, CMA   Authorized by:   Jacques Navy MD   Signed by:   Lamar Sprinkles, CMA on 04/04/2010   Method used:   Electronically to        [facility] (retail)       [address]       [place], [place]  [address]       Ph: [phone]       Fax: [phone]   RxID:   [id number]

## 2010-04-19 NOTE — Progress Notes (Signed)
Summary: REQ FOR   Phone Note From Other Clinic   Caller: [phone] [facility] Summary of Call: [facility] therapist is req verbal ok for speech therapy eval.  Initial call taken by: Lamar Sprinkles, CMA,  April 08, 2010 12:47 PM  Follow-up for Phone Call        ok for speech therapy eval Follow-up by: Jacques Navy MD,  April 08, 2010 6:05 PM  Additional Follow-up for Phone Call Additional follow up Details #1::        left detailed vm for therapist Additional Follow-up by: Lamar Sprinkles, CMA,  April 11, 2010 12:01 PM

## 2010-04-19 NOTE — Miscellaneous (Signed)
Summary: [facility] Plans/[facility]  [facility] Plans/[facility]   Imported By: Sherian Rein 04/14/2010 14:19:12  _____________________________________________________________________  External Attachment:    Type:   Image     Comment:   External Document

## 2010-04-25 LAB — GLUCOSE, CAPILLARY
Glucose-Capillary: 181 mg/dL — ABNORMAL HIGH (ref 70–99)
Glucose-Capillary: 187 mg/dL — ABNORMAL HIGH (ref 70–99)
Glucose-Capillary: 192 mg/dL — ABNORMAL HIGH (ref 70–99)
Glucose-Capillary: 210 mg/dL — ABNORMAL HIGH (ref 70–99)
Glucose-Capillary: 230 mg/dL — ABNORMAL HIGH (ref 70–99)
Glucose-Capillary: 281 mg/dL — ABNORMAL HIGH (ref 70–99)
Glucose-Capillary: 295 mg/dL — ABNORMAL HIGH (ref 70–99)
Glucose-Capillary: 340 mg/dL — ABNORMAL HIGH (ref 70–99)
Glucose-Capillary: 119 mg/dL — ABNORMAL HIGH (ref 70–99)
Glucose-Capillary: 126 mg/dL — ABNORMAL HIGH (ref 70–99)
Glucose-Capillary: 126 mg/dL — ABNORMAL HIGH (ref 70–99)
Glucose-Capillary: 128 mg/dL — ABNORMAL HIGH (ref 70–99)
Glucose-Capillary: 128 mg/dL — ABNORMAL HIGH (ref 70–99)
Glucose-Capillary: 133 mg/dL — ABNORMAL HIGH (ref 70–99)
Glucose-Capillary: 134 mg/dL — ABNORMAL HIGH (ref 70–99)
Glucose-Capillary: 138 mg/dL — ABNORMAL HIGH (ref 70–99)
Glucose-Capillary: 139 mg/dL — ABNORMAL HIGH (ref 70–99)
Glucose-Capillary: 141 mg/dL — ABNORMAL HIGH (ref 70–99)
Glucose-Capillary: 155 mg/dL — ABNORMAL HIGH (ref 70–99)
Glucose-Capillary: 157 mg/dL — ABNORMAL HIGH (ref 70–99)
Glucose-Capillary: 161 mg/dL — ABNORMAL HIGH (ref 70–99)
Glucose-Capillary: 163 mg/dL — ABNORMAL HIGH (ref 70–99)
Glucose-Capillary: 163 mg/dL — ABNORMAL HIGH (ref 70–99)
Glucose-Capillary: 177 mg/dL — ABNORMAL HIGH (ref 70–99)
Glucose-Capillary: 182 mg/dL — ABNORMAL HIGH (ref 70–99)
Glucose-Capillary: 184 mg/dL — ABNORMAL HIGH (ref 70–99)
Glucose-Capillary: 189 mg/dL — ABNORMAL HIGH (ref 70–99)
Glucose-Capillary: 202 mg/dL — ABNORMAL HIGH (ref 70–99)
Glucose-Capillary: 203 mg/dL — ABNORMAL HIGH (ref 70–99)
Glucose-Capillary: 210 mg/dL — ABNORMAL HIGH (ref 70–99)
Glucose-Capillary: 218 mg/dL — ABNORMAL HIGH (ref 70–99)
Glucose-Capillary: 220 mg/dL — ABNORMAL HIGH (ref 70–99)
Glucose-Capillary: 247 mg/dL — ABNORMAL HIGH (ref 70–99)
Glucose-Capillary: 251 mg/dL — ABNORMAL HIGH (ref 70–99)
Glucose-Capillary: 252 mg/dL — ABNORMAL HIGH (ref 70–99)
Glucose-Capillary: 256 mg/dL — ABNORMAL HIGH (ref 70–99)
Glucose-Capillary: 263 mg/dL — ABNORMAL HIGH (ref 70–99)

## 2010-04-25 LAB — URINALYSIS, ROUTINE W REFLEX MICROSCOPIC
Bilirubin Urine: NEGATIVE
Bilirubin Urine: NEGATIVE
Glucose, UA: NEGATIVE mg/dL
Glucose, UA: NEGATIVE mg/dL
Ketones, ur: NEGATIVE mg/dL
Ketones, ur: NEGATIVE mg/dL
Nitrite: NEGATIVE
Nitrite: NEGATIVE
Specific Gravity, Urine: 1.016 (ref 1.005–1.030)
pH: 5 (ref 5.0–8.0)
pH: 5 (ref 5.0–8.0)

## 2010-04-25 LAB — BASIC METABOLIC PANEL
BUN: 21 mg/dL (ref 6–23)
BUN: 10 mg/dL (ref 6–23)
BUN: 23 mg/dL (ref 6–23)
BUN: 24 mg/dL — ABNORMAL HIGH (ref 6–23)
CO2: 26 mEq/L (ref 19–32)
Calcium: 8.9 mg/dL (ref 8.4–10.5)
Calcium: 8.9 mg/dL (ref 8.4–10.5)
Calcium: 9 mg/dL (ref 8.4–10.5)
Chloride: 111 mEq/L (ref 96–112)
Chloride: 106 mEq/L (ref 96–112)
Chloride: 110 mEq/L (ref 96–112)
Chloride: 112 mEq/L (ref 96–112)
Creatinine, Ser: 1.11 mg/dL (ref 0.4–1.5)
Creatinine, Ser: 1.48 mg/dL (ref 0.4–1.5)
GFR calc Af Amer: 57 mL/min — ABNORMAL LOW (ref >60)
GFR calc Af Amer: 52 mL/min — ABNORMAL LOW (ref >60)
GFR calc Af Amer: 57 mL/min — ABNORMAL LOW (ref >60)
GFR calc non Af Amer: 47 mL/min — ABNORMAL LOW (ref >60)
GFR calc non Af Amer: 43 mL/min — ABNORMAL LOW (ref >60)
GFR calc non Af Amer: 47 mL/min — ABNORMAL LOW (ref >60)
Glucose, Bld: 223 mg/dL — ABNORMAL HIGH (ref 70–99)
Glucose, Bld: 125 mg/dL — ABNORMAL HIGH (ref 70–99)
Glucose, Bld: 131 mg/dL — ABNORMAL HIGH (ref 70–99)
Glucose, Bld: 181 mg/dL — ABNORMAL HIGH (ref 70–99)
Potassium: 4 mEq/L (ref 3.5–5.1)
Potassium: 4.1 mEq/L (ref 3.5–5.1)
Potassium: 3.6 mEq/L (ref 3.5–5.1)
Potassium: 4 mEq/L (ref 3.5–5.1)
Potassium: 4.7 mEq/L (ref 3.5–5.1)
Sodium: 146 mEq/L — ABNORMAL HIGH (ref 135–145)
Sodium: 146 mEq/L — ABNORMAL HIGH (ref 135–145)
Sodium: 145 mEq/L (ref 135–145)
Sodium: 147 mEq/L — ABNORMAL HIGH (ref 135–145)
Sodium: 154 mEq/L — ABNORMAL HIGH (ref 135–145)

## 2010-04-25 LAB — POCT CARDIAC MARKERS
CKMB, poc: 1.9 ng/mL (ref 1.0–8.0)
CKMB, poc: 3.2 ng/mL (ref 1.0–8.0)
Myoglobin, poc: >500 ng/mL (ref 12–200)
Myoglobin, poc: >500 ng/mL (ref 12–200)
Troponin i, poc: <0.05 ng/mL (ref 0.00–0.09)
Troponin i, poc: <0.05 ng/mL (ref 0.00–0.09)

## 2010-04-25 LAB — COMPREHENSIVE METABOLIC PANEL
ALT: 30 U/L (ref 0–53)
Albumin: 2.9 g/dL — ABNORMAL LOW (ref 3.5–5.2)
BUN: 35 mg/dL — ABNORMAL HIGH (ref 6–23)
Calcium: 9 mg/dL (ref 8.4–10.5)
Glucose, Bld: 177 mg/dL — ABNORMAL HIGH (ref 70–99)
Sodium: 147 mEq/L — ABNORMAL HIGH (ref 135–145)
Total Protein: 8.1 g/dL (ref 6.0–8.3)

## 2010-04-25 LAB — POCT I-STAT, CHEM 8
BUN: 14 mg/dL (ref 6–23)
BUN: 46 mg/dL — ABNORMAL HIGH (ref 6–23)
Calcium, Ion: 1.17 mmol/L (ref 1.12–1.32)
Chloride: 117 mEq/L — ABNORMAL HIGH (ref 96–112)
Creatinine, Ser: 1.1 mg/dL (ref 0.4–1.5)
Creatinine, Ser: 2.1 mg/dL — ABNORMAL HIGH (ref 0.4–1.5)
Glucose, Bld: 78 mg/dL (ref 70–99)
HCT: 37 % — ABNORMAL LOW (ref 39.0–52.0)
Hemoglobin: 12.6 g/dL — ABNORMAL LOW (ref 13.0–17.0)
Hemoglobin: 13.9 g/dL (ref 13.0–17.0)
Potassium: 4 mEq/L (ref 3.5–5.1)
Potassium: 5.1 meq/L (ref 3.5–5.1)
Sodium: 143 mEq/L (ref 135–145)
Sodium: 149 mEq/L — ABNORMAL HIGH (ref 135–145)
TCO2: 28 mmol/L (ref 0–100)

## 2010-04-25 LAB — PROTIME-INR
INR: 1.13 (ref 0.00–1.49)
INR: 1.17 (ref 0.00–1.49)
Prothrombin Time: 14.4 s (ref 11.6–15.2)

## 2010-04-25 LAB — DIFFERENTIAL
Basophils Absolute: 0.1 10*3/uL (ref 0.0–0.1)
Basophils Relative: 1 % (ref 0–1)
Eosinophils Absolute: 0 10*3/uL (ref 0.0–0.7)
Lymphocytes Relative: 17 % (ref 12–46)
Lymphs Abs: 1.4 10*3/uL (ref 0.7–4.0)
Lymphs Abs: 1.8 10*3/uL (ref 0.7–4.0)
Monocytes Absolute: 0.6 10*3/uL (ref 0.1–1.0)
Monocytes Relative: 5 % (ref 3–12)
Monocytes Relative: 7 % (ref 3–12)
Neutro Abs: 5.9 10*3/uL (ref 1.7–7.7)
Neutro Abs: 7.1 10*3/uL (ref 1.7–7.7)
Neutro Abs: 7.3 10*3/uL (ref 1.7–7.7)
Neutrophils Relative %: 72 % (ref 43–77)
Neutrophils Relative %: 78 % — ABNORMAL HIGH (ref 43–77)

## 2010-04-25 LAB — CBC
HCT: 34.9 % — ABNORMAL LOW (ref 39.0–52.0)
HCT: 36.5 % — ABNORMAL LOW (ref 39.0–52.0)
Hemoglobin: 11.7 g/dL — ABNORMAL LOW (ref 13.0–17.0)
Hemoglobin: 11.9 g/dL — ABNORMAL LOW (ref 13.0–17.0)
Hemoglobin: 12.4 g/dL — ABNORMAL LOW (ref 13.0–17.0)
MCHC: 33.2 g/dL (ref 30.0–36.0)
MCHC: 33.4 g/dL (ref 30.0–36.0)
MCHC: 33.8 g/dL (ref 30.0–36.0)
MCV: 92.8 fL (ref 78.0–100.0)
MCV: 92.2 fL (ref 78.0–100.0)
MCV: 92.7 fL (ref 78.0–100.0)
Platelets: 180 10*3/uL (ref 150–400)
Platelets: 123 10*3/uL — ABNORMAL LOW (ref 150–400)
RBC: 3.85 MIL/uL — ABNORMAL LOW (ref 4.22–5.81)
RBC: 3.88 MIL/uL — ABNORMAL LOW (ref 4.22–5.81)
RBC: 3.96 MIL/uL — ABNORMAL LOW (ref 4.22–5.81)
RDW: 14.2 % (ref 11.5–15.5)
WBC: 10.1 10*3/uL (ref 4.0–10.5)
WBC: 9.4 10*3/uL (ref 4.0–10.5)

## 2010-04-25 LAB — MAGNESIUM: Magnesium: 2.5 mg/dL (ref 1.5–2.5)

## 2010-04-25 LAB — URINE CULTURE
Colony Count: NO GROWTH
Culture: NO GROWTH

## 2010-04-25 LAB — POCT I-STAT 3, ART BLOOD GAS (G3+)
Bicarbonate: 25.4 mEq/L — ABNORMAL HIGH (ref 20.0–24.0)
pH, Arterial: 7.39 (ref 7.350–7.450)
pO2, Arterial: 357 mmHg — ABNORMAL HIGH (ref 80.0–100.0)

## 2010-04-25 LAB — TROPONIN I
Troponin I: 0.03 ng/mL (ref 0.00–0.06)
Troponin I: 0.04 ng/mL (ref 0.00–0.06)

## 2010-04-25 LAB — URINE MICROSCOPIC-ADD ON

## 2010-04-25 LAB — RAPID URINE DRUG SCREEN, HOSP PERFORMED
Cocaine: POSITIVE — AB
Opiates: NOT DETECTED
Tetrahydrocannabinol: NOT DETECTED

## 2010-04-25 LAB — CK TOTAL AND CKMB (NOT AT ARMC)
CK, MB: 2 ng/mL (ref 0.3–4.0)
CK, MB: 2.8 ng/mL (ref 0.3–4.0)
Relative Index: 1.7 (ref 0.0–2.5)
Relative Index: 2.4 (ref 0.0–2.5)
Total CK: 117 U/L (ref 7–232)
Total CK: 120 U/L (ref 7–232)
Total CK: 128 U/L (ref 7–232)

## 2010-04-25 LAB — HEMOGLOBIN A1C: Mean Plasma Glucose: 180 mg/dL — ABNORMAL HIGH (ref <117)

## 2010-04-25 LAB — HEPATIC FUNCTION PANEL
ALT: 17 U/L (ref 0–53)
AST: 29 U/L (ref 0–37)
Albumin: 3.7 g/dL (ref 3.5–5.2)
Total Protein: 7.4 g/dL (ref 6.0–8.3)

## 2010-04-25 LAB — AMMONIA: Ammonia: 14 umol/L (ref 11–35)

## 2010-04-25 LAB — APTT: aPTT: 20 s — ABNORMAL LOW (ref 24–37)

## 2010-04-25 LAB — D-DIMER, QUANTITATIVE: D-Dimer, Quant: 2.45 ug/mL-FEU — ABNORMAL HIGH (ref 0.00–0.48)

## 2010-04-25 LAB — ETHANOL: Alcohol, Ethyl (B): <5 mg/dL (ref 0–10)

## 2010-04-25 LAB — TSH: TSH: 2.58 u[IU]/mL (ref 0.350–4.500)

## 2010-04-26 NOTE — Miscellaneous (Signed)
Summary: Care Plan/[facility]  [facility] Plan/[facility]   Imported By: Lester Devens 04/20/2010 08:02:10  _____________________________________________________________________  External Attachment:    Type:   Image     Comment:   External Document

## 2010-05-06 ENCOUNTER — Other Ambulatory Visit: Payer: Self-pay | Admitting: Internal Medicine

## 2010-05-06 ENCOUNTER — Other Ambulatory Visit: Payer: Self-pay | Admitting: Endocrinology

## 2010-05-06 DIAGNOSIS — E1065 Type 1 diabetes mellitus with hyperglycemia: Secondary | ICD-10-CM

## 2010-05-20 DIAGNOSIS — E119 Type 2 diabetes mellitus without complications: Secondary | ICD-10-CM

## 2010-05-20 DIAGNOSIS — I69969 Other paralytic syndrome following unspecified cerebrovascular disease affecting unspecified side: Secondary | ICD-10-CM

## 2010-05-20 DIAGNOSIS — M6281 Muscle weakness (generalized): Secondary | ICD-10-CM

## 2010-05-20 DIAGNOSIS — I6350 Cerebral infarction due to unspecified occlusion or stenosis of unspecified cerebral artery: Secondary | ICD-10-CM

## 2010-05-20 DIAGNOSIS — I10 Essential (primary) hypertension: Secondary | ICD-10-CM

## 2010-05-26 ENCOUNTER — Ambulatory Visit: Payer: MEDICARE | Admitting: Internal Medicine

## 2010-06-21 NOTE — Assessment & Plan Note (Signed)
[facility]                         ELECTROPHYSIOLOGY OFFICE NOTE   [name], [name]                        MRN:          [id number]  DATE:02/11/2007                            DOB:          23-Dec-1940    [name] comes in because he is complaining of pain and discharge at  his defibrillator site.  The defibrillator was implanted about a year  and a half ago.   On examination, his device pocket is well healed without overlying  erythema, warmth, or tenderness.  There is tenderness over his breast  and there is discharge actually from his nipple.   I discussed this with Dr. Debby Bud.  We will plan to get a prolactin level  and he will follow up with Dr. Debby Bud.     Duke Salvia, MD, [facility]  Electronically Signed    SCK/MedQ  DD: 02/11/2007  DT: 02/11/2007  Job #: [id number]

## 2010-06-21 NOTE — Assessment & Plan Note (Signed)
[facility]                         ELECTROPHYSIOLOGY OFFICE NOTE   METRO, EDENFIELD                        MRN:          [id number]  DATE:01/21/2008                            DOB:          05-30-1940    [name] is here for unscheduled visit.  He was seen in our Research  Department, where he was having some shortness of breath problems.  He  is a very pleasant male with a history of cardiomyopathy and severe LV  dysfunction and congestive heart failure, and over the last couple of  days, he has had increasing dyspnea, shortness of breath.  He denies  chest pain.  He has had no syncope or any intercurrent IC therapies.   Current medications are:  1. Simvastatin 40 a day.  2. Aspirin 325 a day.  3. Zantac 75 twice a day.  4. Carvedilol 25 one and half tablet twice a day.  5. Metformin 500 twice a day.  6. Benicar 40 a day.  7. Furosemide 20 mg 2 tablets in the morning and 1 tablet in the      evening.  8. Humulin 70/30 as directed.  9. Klor-Con 20 two tablets in the morning and one in the evening.   PHYSICAL EXAMINATION:  GENERAL:  He is a pleasant man in no acute  distress.  VITAL SIGNS:  His blood pressure was 141/78, pulse was 70 and regular,  respirations were 22, weight was 224 pounds.  NECK:  A 7-cm jugular venous distention.  There are no thyromegaly.  Trachea was midline.  LUNGS:  Rales in the bases bilaterally.  No wheezes or rhonchi present.  CARDIOVASCULAR:  Regular rate and rhythm.  Normal S1 and S2.  ABDOMEN:  Soft and nontender.  EXTREMITIES:  No edema.  PMI was enlarged and laterally displaced.  SKIN:  Normal.   Interrogation of his defibrillator demonstrates a Guidant Vitality.  The  R-waves were greater than 25, the impedance 405, and threshold 0.8 at  0.4.  Battery voltage was 3.1 volts.   IMPRESSION:  1. Acute on chronic systolic heart failure with exacerbation though      mild.  2. Nonischemic cardiomyopathy  status post defibrillator insertion.  3. Hypertension.   DISCUSSION:  [name] is stable.  He appeared to be fluid overloaded  slightly.  I have asked that he obtain a BNP and BMP today and that we  up titrate his Lasix by a total of 40 mg daily  with 3 tablets in the morning and 2 tablets in the evening.  I will see  the patient back in several months sooner should he have worsening  shortness of breath.     Doylene Canning. Ladona Ridgel, MD  Electronically Signed    GWT/MedQ  DD: 01/21/2008  DT: 01/22/2008  Job #: [id number]

## 2010-06-21 NOTE — Assessment & Plan Note (Signed)
[facility]                         ELECTROPHYSIOLOGY OFFICE NOTE   Frank, [name]                        MRN:          [id number]  DATE:04/12/2007                            DOB:          [birth year]    [name] returns today for follow-up.  He is a very pleasant middle-  aged man with a nonischemic cardiomyopathy and congestive heart failure,  hypertension, and LV dysfunction with an EF of 25%.  He returns today  for follow-up.  He is now almost 2 years out from his prophylactic ICD  implantation.  Overall, he has been stable.  He does have some  persistent dyspnea but this is improved as he is no longer now on ACE  inhibitors.  He denies any intercurrent IC therapies.  He admits to  continued smoking cigarettes and continued problems with his blood  pressure.   Medications include:  1. Simvastatin 40 a day.  2. Aspirin 325 a day.  3. Zantac 75 twice daily.  4. Carvedilol 25 mg twice daily.  5. Potassium supplements.  6. Humulin 70/30 as directed.  7. Furosemide 20 mg two tablets in the morning and one in the evening.  8. Benicar 40 mg daily.  9. Metformin 500 twice daily.   PHYSICAL EXAMINATION:  He is a pleasant, well-appearing middle-aged man  in no distress.  Blood pressure is 149/79 and the pulse was 68 and  regular, the respirations were 18.  Weight was 230 pounds.  NECK:  Revealed no jugular distention.  LUNGS:  Clear bilaterally to auscultation.  No wheezes, rales or rhonchi  are present.  CARDIOVASCULAR EXAM:  Revealed a rate and rhythm with normal S1 and S2.  ABDOMINAL EXAM:  Soft, nontender, nondistended.  There is no  organomegaly.  EXTREMITIES:  Demonstrated no cyanosis, clubbing or edema.   Interrogation of his defibrillator demonstrates a Guidant Vitality  single-chamber device.  The R-waves were 19, the impedance 623, the  threshold 0.8 at 0.5.  Battery voltage was 3.18 volts.  There are no  intercurrent IC  therapies.   IMPRESSION:  1. Nonischemic cardiomyopathy.  2. Congestive heart failure.  3. Status post internal cardioverter-defibrillator implantation.   DISCUSSION:  Overall, [name] is stable.  His blood pressure remains  elevated and he continues to smoke cigarettes.  I have increased today  his carvedilol from 25 twice daily to 25  mg one-and-a-half tablets twice daily.  I will plan to see the patient  back in the office in 1 year.  He will follow up with additional blood  pressure checks under the direction of Dr. Debby Bud.     [name]. Ladona Ridgel, MD  Electronically Signed    GWT/MedQ  DD: 04/12/2007  DT: 04/13/2007  Job #: [id number]

## 2010-06-21 NOTE — Assessment & Plan Note (Signed)
[name]                         ELECTROPHYSIOLOGY OFFICE NOTE   [name], [name]                        MRN:          [id number]  DATE:04/02/2008                            DOB:          May 22, 1940    [name] returns today for a followup.  He is a very pleasant male with  an ischemic cardiomyopathy with a longstanding history of congestive  heart failure.  He recently had pneumonia and was treated as an  outpatient with antibiotics.  He returns today for followup.  His  pneumonia has improved.  He does complain of some residual pleuritis-  type symptoms with pain with deep breath.  This is also much improved  today.  Otherwise, he has been stable.  His heart failure has been class  II.   MEDICATIONS:  1. Simvastatin 40 a day.  2. Aspirin 325 a day.  3. Ranitidine 75 twice a day.  4. Carvedilol 25 one and a half tablet twice daily.  5. Potassium 20 two tablets in the morning and 1 in the evening.  6. Humulin insulin.  7. Furosemide 40 the morning and 20 in the evening.  8. Benicar 40 a day.  9. Metformin 500 twice a day.   PHYSICAL EXAMINATION:  GENERAL:  He is a pleasant well-appearing man in  no distress.  VITAL SIGNS:  Blood pressure today was 149/73, the pulse was 70 and  regular, the respirations were 18, and the weight was 226 pounds.  NECK:  No jugular venous distention.  LUNGS:  Clear bilaterally to auscultation.  No wheezes, rales, or  rhonchi are present.  No increased work of breathing.  Of note, on the  lung exam there were no E/A changes present.  CARDIOVASCULAR:  Regular rate and rhythm.  Normal S1 and S2.  ABDOMEN:  Soft and nontender.  EXTREMITIES:  No edema.   Interrogation of his defibrillator demonstrates a Guidant vitality T-  175.  R waves were greater than 25.  The impedance was 631.  The  threshold was 0.8 at 0.5.  The battery voltage was 3.05 volts.   IMPRESSION:  1. Nonischemic cardiomyopathy.  2.  Congestive heart failure.  3. Status post implantable cardioverter-defibrillator insertion.  4. Recent history of pneumonia.   DISCUSSION:  Overall, [name] is stable.  His defibrillator is working  normally.  His pneumonia is improved.  I will see the patient back for  ICD followup in 1 year.     Doylene Canning. Ladona Ridgel, MD  Electronically Signed    GWT/MedQ  DD: 04/02/2008  DT: 04/03/2008  Job #: [id number]

## 2010-06-21 NOTE — Assessment & Plan Note (Signed)
[facility]                         ELECTROPHYSIOLOGY OFFICE NOTE   [name], [name]                        MRN:          [id number]  DATE:11/06/2007                            DOB:          1940-08-14    [name] returns today for followup.  He is a very pleasant middle-aged  male with a history of hypertension, an ischemic cardiomyopathy, and  congestive heart failure.  He is status post ICD insertion.  He denies  chest pain.  He has had no intercurrent ICD therapies.  His heart  failure symptoms are well controlled.   MEDICATIONS:  1. Simvastatin 40 a day.  2. Aspirin 325 a day.  3. Ranitidine 75 twice daily.  4. Carvedilol 37.5 mg twice daily.  5. Potassium 20 mEq 2 tablets in the morning and 1 in the evening.  6. Humulin insulin as directed.  7. Furosemide 20 mg 2 tablets in the morning and 1 tablet in the      evening.  8. Metformin 500 mg twice daily.   PHYSICAL EXAMINATION:  GENERAL:  He is a pleasant well-appearing man in  no distress.  VITAL SIGNS:  Blood pressure was 149/77; the pulse 64 and regular; the  respirations were 18; and the weight was 223 pounds, down 7 pounds from  his visit back in March.  NECK:  No jugular venous distention.  LUNGS:  Clear bilaterally to auscultation.  No wheezes, rales, or  rhonchi are present.  CARDIOVASCULAR:  Regular rate and rhythm.  Normal S1 and S2.  ABDOMEN:  Soft and nontender.  EXTREMITIES:  No edema.   Interrogation of his defibrillator demonstrates a Guidant Vitality T-  175.  The R waves were 24, the impedance 640, and the threshold is 0.8  at 0.5.  Battery voltage was 3.14 volts.  Underlying rhythm was sinus at  60.   IMPRESSION:  1. Nonischemic cardiomyopathy.  2. Congestive heart failure.  3. Status post implantable cardioverter-defibrillator insertion.   DISCUSSION:  Overall, [name] is stable and his defibrillator is  working normally.  His heart failure is well  controlled.  We will plan  to see him back for ICD followup in several months.     Doylene Canning. Ladona Ridgel, MD  Electronically Signed   GWT/MedQ  DD: 11/06/2007  DT: 11/07/2007  Job #: [id number]

## 2010-06-24 NOTE — Discharge Summary (Signed)
NAME:  [name], [name] NO.:  [id number]   MEDICAL RECORD NO.:  [id number]          PATIENT TYPE:  INP   LOCATION:  4704                         FACILITY:  [facility]   PHYSICIAN:  Rollene Rotunda, M.D.   DATE OF BIRTH:  October 25, 1940   DATE OF ADMISSION:  03/07/2005  DATE OF DISCHARGE:  03/08/2005                                 DISCHARGE SUMMARY   PRINCIPLE DIAGNOSIS:  Chest pain.   OTHER DIAGNOSES:  1.  Nonischemic cardiomyopathy.  2.  Hypertension.  3.  Hyperlipidemia, type II.  4.  Diabetes mellitus.  5.  Peripheral vascular disease.  6.  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  7.  History of back surgery.  8.  Obesity.   ALLERGIES:  NO KNOWN DRUG ALLERGIES.   PROCEDURES:  Left heart cardiac catheterization.   PRIMARY CARDIOLOGIST:  Cecil Cranker, M.D.   HISTORY OF PRESENT ILLNESS:  A 70 year old African American male with prior  history of non ischemic cardiomyopathy who recently saw Dr. Corinda Gubler on  March 01, 2005 at which time they decided that he would have an outpatient  cardiac catheterization performed in early February per the patient's  preference.  However on January 28, he developed left arm pain with  radiation to his jaw and substernal area that occurred with rest.  He took  an aspirin with some decrease in discomfort.  He then called into our office  on January 20 and was seen on January 30.  The decision was made to admit  him for cardiac catheterization.   HOSPITAL COURSE:  The patient was admitted and ruled out for MI.  He  underwent left heart cardiac catheterization January 31, which revealed 25-  30% proximal stenosis in the LAD, 80% ostial stenosis and small second  diagonal, a small normal left circumflex, a large branching normal ramus,  and a dominant right coronary artery with 30-50% diffuse stenosis.  The  right heart catheterization was also performed revealing a wedge pressure of  24, cardiac output of 4.4 and cardiac  index of 2.0.  EF was 30% with global  hypokinesis.  It was determined that the patient would continue to benefit  from medical management and his Coreg doses were nitrated from 3.125 b.i.d.  to 6.25 mg b.i.d.  To make room for this, his Norvasc has been discontinued.  He otherwise has been maintained on his ACE inhibitor therapy.  He is being  discharged home today, post catheterization in satisfactory condition.   DISCHARGE LABS:  Hemoglobin 12.4, hematocrit 35.9, WBC 7.3, platelets 187,  MCV 88.1, sodium 135, potassium 3.9, chloride 103, CO2 26, BUN 16,  creatinine 1.1, glucose 186.  Total bilirubin .65, alkaline phosphatase 71,  AST 16, ALT 14, albumin 3.5.  Cardiac markers are negative x 2.  Total  cholesterol 145, triglycerides 255, HDL 29, LDL 65, calcium 9.0, TSH 3.461.   DISPOSITION:  The patient is being discharged home today in good condition.  He has a follow up appointment at [facility] on March 17, 2005 at 11:30 a.m.  He also has a follow up with Dr. Corinda Gubler on April 11, 2005 at 10:30 a.m.  The patient will be considered for placement of an AICD  in the future once his medications are optimally titrated.   DISCHARGE MEDICATIONS:  1.  Aspirin 81 mg q. day.  2.  Zocor 20 mg q.h.s.  3.  Lisinopril 40 mg q. day.  4.  Protonix 40 mg q. day.  5.  Coreg 6.25 mg b.i.d.  6.  Insulin 70/30, 30 units in the a.m., 20 units in the p.m.   OUTSTANDING LAB STUDIES:  None.   DURATION OF DISCHARGE ENCOUNTER:  35 minutes including physician time.      Ok Anis, NP    ______________________________  Rollene Rotunda, M.D.    CRB/MEDQ  D:  03/08/2005  T:  03/08/2005  Job:  [id number]   cc:   Rollene Rotunda, M.D.  [address]  [address]  [place]  [place] [address]   E. Graceann Congress, M.D.  [address]  [address]  [place]  [place] [address]   Rosalyn Gess. Norins, M.D. [facility]  [address]  [place]  [place] [address]

## 2010-06-24 NOTE — Assessment & Plan Note (Signed)
[facility]                           ELECTROPHYSIOLOGY OFFICE NOTE   MOUSA, PROUT                        MRN:          [id number]  DATE:10/24/2005                            DOB:          [birth year]    Mr. Voiles returns today for follow up.  He is a very pleasant middle aged  obese male with a history of nonischemic cardiomyopathy and congestive heart  failure who is enrolled in our Mabit CRT trial and randomized to a single  chamber defibrillator implantation.  He returns today for follow up.  He  denies chest pain or shortness of breath and has, overall, been stable.  He  has had no ICD therapies.   On physical exam, he is a pleasant, well appearing, obese man in no acute  distress.  Blood pressure was 174/88, pulse 76 and regular, respirations 18,  weight 232 pounds.  The neck revealed no jugular venous distention.  The  lungs are clear to auscultation bilaterally.  Cardiovascular exam reveals a  regular rate and rhythm, normal S1 and S2.  Extremities demonstrate no  cyanosis, clubbing, and edema.   Interrogation of his defibrillator demonstrates a Guidant Vitality T175, R  waves of 21, pacing impedance 615, and a pacing threshold of 0.6 volts at  0.5 milliseconds.  Battery voltage is 3.24 volts.  There are no  intercoronary ICD therapies.  Today, we turned the output down from 3.5 to  2.8 in the ventricle.   IMPRESSION:  1. Nonischemic cardiomyopathy.  2. Congestive heart failure.  3. Status post ICD insertion.   DISCUSSION:  Overall, Mr. Vickers is stable.  His defibrillator is working  normally.  We will plan to see him back in the office in June 2008 as part  of our Mabit CRT study.                                   Doylene Canning. Ladona Ridgel, MD   GWT/MedQ  DD:  10/24/2005  DT:  10/25/2005  Job #:  [id number]

## 2010-06-24 NOTE — Discharge Summary (Signed)
   NAME:  [name], [name] NO.:  [id number]   MEDICAL RECORD NO.:  [id number]                   PATIENT TYPE:  INP   LOCATION:  5035                                 FACILITY:  [facility]   PHYSICIAN:  Rosalyn Gess. Norins, M.D. [facility]         DATE OF BIRTH:  12-26-1940   DATE OF ADMISSION:  05/26/2002  DATE OF DISCHARGE:  05/27/2002                                 DISCHARGE SUMMARY   Please see the initial note.   SUMMARY:  In summary the patient was admitted with abdominal pain and  discomfort with CT evidence of mild diverticulitis on the left.  He was  started on p.o. antibiotics.   The patient also had been on a significant binge.  He had a drug screen that  was positive for cocaine and opiates.  The alcohol level was less than 5.   No new laboratories were obtained.  The patient did well overnight with no  complaints or problems.  He is now at this time comfortable with no  significant abdominal pain.  Vital signs are stable and he is thought to be  ready for discharge.   PHYSICAL EXAMINATION:  VITAL SIGNS:  The patient is afebrile.  Blood  pressure 175/77.  GENERAL APPEARANCE:  This is an _________ gentleman in no acute distress.  CHEST:  Clear.  CARDIOVASCULAR:  2+ radial pulses.  Precordium was quiet.  He had a regular  rate and rhythm.  ABDOMEN:  Positive bowel sounds.  There was no tenderness to palpation.   DISPOSITION:  The patient is discharged home.   MEDICATIONS:  He is to resume all of his home medications for his  hypertension and diabetes.  He is to take Augmentin 875 mg b.i.d. for seven  days.   FOLLOW UP:  The patient is adamantly encouraged to seek drug treatment for  his alcohol abuse.   The patient is to see Dr. Debby Bud in follow-up in 2-3 weeks.                                                Rosalyn Gess Norins, M.D. [facility]    MEN/MEDQ  D:  05/27/2002  T:  05/27/2002  Job:  [id number]

## 2010-06-24 NOTE — H&P (Signed)
NAME:  Frank, [name] NO.:  [id number]   MEDICAL RECORD NO.:  [id number]            PATIENT TYPE:   LOCATION:                                 FACILITY:   PHYSICIAN:  Rollene Rotunda, M.D.   DATE OF BIRTH:  Jun 08, 1940   DATE OF ADMISSION:  DATE OF DISCHARGE:                                HISTORY & PHYSICAL   HISTORY OF PRESENT ILLNESS:  [name] is a 70 year old African-American  male with a past medical history of moderate coronary artery disease by  catheterization in 2000 with an ejection fraction of 31% by last Cardiolite  in May 13, 1998. The patient's last catheterization on May 20, 1998  showed an 80% diagonal but otherwise nonobstructive disease. Last stress  Cardiolite showed mild ischemia in inferior and inferior apical wall with an  ejection fraction of 31%. His chronic left ventricular dysfunction is with  an ejection fraction of 25-30% with known moderate coronary artery disease.  At this time it is not clear whether it is ischemic or nonischemic  cardiomyopathy. [name] had seen Dr. Corinda Gubler on March 01, 2005. Dr.  Corinda Gubler had planned a cardiac catheterization for [name]. However, Mr.  [name] brother was also in [place] for an aneurysm at the time and the  patient had asked to defer until the middle of February. However, yesterday  the patient stated that on Sunday evening he had some arm pain that went up  into his jaw and left substernal chest pain. The patient did take an aspirin  and pain decreased. The patient is seen today stating he has no chest pain  currently. However, he does have some shortness of breath. The last visit  with Dr. Corinda Gubler it had also been discussed with Dr. Ladona Ridgel for evaluation  of a cardioverter-defibrillator. The cardio-defibrillator is to be  considered to be evaluated for at a further point. The patient was also  started on Coreg 3.125 b.i.d. on his visit of March 01, 2005.   PHYSICAL EXAMINATION:   VITAL SIGNS:  Shows a blood pressure of 160/74 with a  weight of 235 pounds, a pulse of 74, his respiratory rate is 20.  GENERAL:  He is well-developed, well-nourished, in no acute distress.  SKIN:  At this time is warm and dry.  HEENT:  Unremarkable, normal eyelids.  NECK:  Supple with normal upstroke bilaterally. There are no bruits noted,  no jugular venous distention, no thyromegaly.  CHEST:  Clear to auscultation.  CARDIOVASCULAR:  Regular rate and rhythm. No murmurs, rubs, or gallops  appreciated.  ABDOMEN:  Nontender, nondistended. No rebound or guarding. He has positive  bowel sounds in all four quadrants. No organomegaly. Femoral pulses are 2+  bilaterally, no bruits appreciated.  EXTREMITIES:  No edema, 2+ DP and PT.  NEUROLOGIC:  Grossly intact.   EKG with a right bundle-branch block, left anterior fascicular block, no  acute ST-T wave changes.   SOCIAL HISTORY:  The patient is retired. He used to be a Oceanographer. He has four children and is currently married. He  smokes  approximately one pack per day of cigarettes. Occasional alcohol use.   ASSESSMENT AND PLAN:  The patient will be admitted to telemetry unit for  evaluation of his chest pain with his history of coronary artery disease.  Plan with Dr. Antoine Poche is to have the patient catheterized on March 08, 2005, to  evaluate for ischemic. The patient is to be placed on nitroglycerin paste  and heparin per pharmacy. The patient is to have enteric-coated aspirin,  cycle cardiac enzymes q.8h. x3, and given sliding scale insulin during his  hospitalization for his diabetes mellitus.     ______________________________  April Humphrey, NP    ______________________________  Rollene Rotunda, M.D.    AH/MEDQ  D:  03/07/2005  T:  03/07/2005  Job:  [id number]

## 2010-06-24 NOTE — Discharge Summary (Signed)
NAME:  KAYRON, HICKLIN NO.:  [id number]   MEDICAL RECORD NO.:  [id number]          PATIENT TYPE:  INP   LOCATION:  2008                         FACILITY:  [facility]   PHYSICIAN:  Maple Mirza, P.A. DATE OF BIRTH:  12-09-1940   DATE OF ADMISSION:  07/14/2005  DATE OF DISCHARGE:  07/15/2005                                 DISCHARGE SUMMARY   PRINCIPAL DIAGNOSES:  1.  Discharging day one status post implantation of Guidant VITALITY II ICD.  2.  Nonischemic cardiomyopathy, ejection fraction of 25% to 35%.  3.  MADIT-CRT trial.  4.  Catheterization in January 2007, nonobstructive coronary artery disease.  5.  Class II New York Heart Association congestive heart failure symptoms.   SECONDARY DIAGNOSES:  1.  Insulin-dependent diabetes.  2.  Dyslipidemia.  3.  Hypertension.  4.  Gastroesophageal reflux disease.  5.  Ongoing tobacco habituation.   PROCEDURE:  On July 14, 2005, implantation of Guidant cardioverter  defibrillator with defibrillator threshold study of less than or equal to 10  joules, Dr. Lewayne Bunting. The patient has had no post procedure  complications. He is in sinus rhythm, discharging, and without hematoma on  post procedure day #1.   BRIEF HISTORY:  Mr. Lemmerman is a 70 year old male. He has a history of  nonischemic cardiomyopathy. He had catheterization in January 2007 which  demonstrated nonobstructive coronary artery disease and an ejection fraction  of 25% to 35%. He was enrolled MADIT-CRT trial and will  present on June 8th  for device implantation. The patient also has a history of diabetes,  dyslipidemia, and hypertension. The risks and benefits of this implantation  have been described to the patient, depending on which wing he will have his  cardioverter defibrillator or cardioverter defibrillator plus biventricular  pacer.   HOSPITAL COURSE:  The patient presented electively on June 8th. He underwent  implantation of the ICD without Bi-V  pacing. The patient has done well post  procedurally and discharged on post procedure day #1.  Chest x-rays after  the procedure have been examined. The device has been interrogated. Mobility  of the left arm has been described to the patient and followup appointments  have been made. He was asked not to drive for the next week, not to lift  anything heavier than 10 pounds for the next four weeks. He is to keep his  incision dry for the next seven days, to sponge bathe until Friday, June  15th.   DISCHARGE MEDICATIONS:  His medications at discharge to include.  1.  Insulin dosage which is Novolin 70/30 on a sliding scale.  2.  Zocor 40 mg at bedtime.  3.  Lisinopril 40 mg daily.  4.  Enteric-coated aspirin 325 mg daily.  5.  Ranitidine 75 mg daily.  6.  Coreg 6.25 mg b.i.d.   FOLLOWUP:  1.  He follows up with [facility], [address] [facility]      [facility], on Thursday, June 21st at 9:00.  2.  He is to see Dr. Ladona Ridgel on Tuesday, September 18th at  2:30.   PERTINENT LABORATORIES THIS ADMISSION:  Sodium 146, potassium 4.4, chloride  110, bicarbonate 29, glucose 151, BUN 17, creatinine 1.3. Complete blood  count revealed white cells 6.7, hemoglobin 13.6, hematocrit 41.2, platelet  count 178,000.      Maple Mirza, P.A.     GM/MEDQ  D:  07/14/2005  T:  07/15/2005  Job:  [id number]   cc:   Doylene Canning. Ladona Ridgel, M.D.  [address]  [address]  [place]  [place] [address]   Rosalyn Gess. Norins, M.D. [facility]  [address]  [place]  [place] [address]

## 2010-06-24 NOTE — Discharge Summary (Signed)
[facility]  Patient:    DEAVEN, URWIN Visit Number: [id number] MRN: [id number]          Service Type: MED Location: 2000 2037 [id number] Attending Physician:  Junious Silk Dictated by:   Tereso Newcomer, P.A. Admit Date:  01/08/2001 Discharge Date: 01/10/2001   CC:         Rosalyn Gess. Norins, M.D. [facility]   Discharge Summary  DATE OF BIRTH:  [birth year]  DISCHARGE DIAGNOSES:  1. Chest pain, etiology unclear.  2. Crack cocaine abuse.  3. Heavy alcohol use.  4. Known moderate coronary artery disease with cardiac catheterization,     May 19, 2000, revealing diagonal one with 80% stenosis, circumflex okay,     right coronary artery 40% stenosis, left ventricular ejection fraction of     35% with global hypokinesis.  5. Insulin-dependent diabetes mellitus.  6. Hypertension.  7. Status post lumbar laminectomy x 2.  8. Hyperlipidemia (intolerant to Lipitor secondary to myalgias).  9. Gastroesophageal reflux disease. 10. Right bundle branch block. 11. 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  HOSPITAL COURSE:  This 70 year old male was admitted through the emergency room on January 08, 2001 with complaints of substernal chest pressure that began after smoking crack cocaine.  The patient used crack cocaine around 9 p.m. on January 07, 2001 and he awoke at 4 oclock in the morning on January 08, 2001 with his chest pain; he had also had a few glasses of gin to drink along with his crack cocaine.  He also noted pain radiating to his back. Upon admission to the emergency room, his blood pressure was 191/97.  His exam was also notable for neck without bruits, chest clear, cardiac regular rate and rhythm, 1/2 systolic murmur, abdomen nontender, extremities without edema. His chest x-ray showed mild congestion.  Please see the dictated H&P by Dr. Madolyn Frieze. Crenshaw for full details.  It was decided to admit the patient.  His beta  blocker was discontinued secondary to cocaine use.  He was placed on nitroglycerin drip to help with his blood pressure.  Aspirin and Altace were continued.  He was started on Zocor for hyperlipidemia.  Serial enzymes were checked and these were negative x 3.  The patient went for adenosine Cardiolite on January 09, 2001.  The images were read out as equivocal by the radiologist.  The radiologist noted images suggested inferoapical ischemia but this was not seen on the polar map. There was mild left ventricular enlargement and the EF was 51%.  Scintigraphic images were reviewed by Dr. Jens Som and discussion was held with Dr. Cecil Cranker.  It was felt that the images revealed trivial inferoapical ischemia versus thinning.  The plan was for continued medical therapy and avoidance of cocaine.  At discharge, the patients heart rate was still in the 60 to 65 range.  Given his use of cocaine in the past and low heart rate, it was decided to keep him off of his beta blocker.  Norvasc was added to help with his blood pressure.  In the future, his beta blocker could be added back but we will wait for his followup appointment to decide on initiating this.  The patient had CT of the chest done on admission to rule out pulmonary embolism and aortic dissection; this was negative.  Patient was found to be in stable condition on January 10, 2001 and it was felt he was ready for discharge to home.  Dr.  [place] saw him on that day.  LABORATORY DATA:  Lipid profile:  Total cholesterol 180, triglycerides 194, HDL 37, LDL 104.  White blood cell count 9500, hemoglobin 13.8, hematocrit 40, platelet count 195,000, MCV 88.1.  INR 0.9.  Sodium 140, potassium 3.8, chloride 105, CO2 22, glucose 94, BUN 12, creatinine 0.7, calcium 9.2, total protein 7.3, albumin 3.9, AST 17, ALT 16, ALP 83, total bilirubin of 0.6. Cardiac enzymes negative x 3.  Alcohol level 22.  Chest CT revealed no evidence of pulmonary  embolus, upper-limits-of-normal hilar lymph nodes, nonspecific, may be reactive, interval followup may be helpful for further evaluation, mild cardiomegaly and moderate coronary atherosclerotic calcification.  DISCHARGE MEDICATIONS:  1. Coated aspirin 325 mg q.d.  2. Altace 5 mg b.i.d.  3. Insulin 70/30, 23 units q.a.m., 12 units q.p.m.  4. Zocor 20 mg q.h.s.  5. Prilosec 20 mg q.d.  6. Norvasc 5 mg q.d.  7. Nitroglycerin p.r.n. chest pain.  ACTIVITY:  Activity is as tolerated.  DIET:  Low fat, low sodium.  SPECIAL DISCHARGE INSTRUCTIONS:  Patient has been advised to stop using cocaine and smoking cigarettes and to decrease his alcohol use significantly.  FOLLOWUP:  Followup is with Dr. Corinda Gubler or the physician assistant in two to three weeks; he should call for an appointment.  He is also to see Dr. Rosalyn Gess. Norins in three to four weeks and he should call for an appointment.  SPECIAL NOTE:  The patient will be counseled prior to discharge on alcohol use and drug use.  He will need followup LFTs and lipid profile in six to eight weeks due to the initiation of Zocor.  He had some hilar lymph nodes that were at the upper limits of normal and consideration should be made at followup whether or not to re-CT him in about four to six weeks. Dictated by:   Tereso Newcomer, P.A. Attending Physician:  Junious Silk DD:  01/10/01 TD:  01/10/01 Job: [id number]

## 2010-06-24 NOTE — Op Note (Signed)
NAME:  [name], CALLEROS NO.:  [id number]   MEDICAL RECORD NO.:  [id number]          PATIENT TYPE:  INP   LOCATION:  2008                         FACILITY:  [facility]   PHYSICIAN:  Doylene Canning. Ladona Ridgel, M.D.  DATE OF BIRTH:  1940/06/09   DATE OF PROCEDURE:  07/14/2005  DATE OF DISCHARGE:                                 OPERATIVE REPORT   PROCEDURE PERFORMED:  Implantation of a single chamber ICD.   INTRODUCTION:  The patient is a 70 year old male with a history of  congestive heart failure which has been long-standing at least dating back  to 2000, with a history of hypertension and left ventricular dysfunction  with known EF of 30% since 2000.  He does not have obstructed coronary  disease.  He has been on maximal beta blockers and ACE inhibitors and  diuretics in the past and is now referred for prophylactic ICD insertion.  Of note, the patient was randomized to the CRT study secondary to all of the  above in the setting of right bundle branch block.   PROCEDURE:  After informed consent was obtained, the patient was taken to  the diagnostic EP lab in a fasting state.  After the usual preparation and  draping, intravenous fentanyl and midazolam was given for sedation.  30 mL  lidocaine was infiltrated into the left infraclavicular region.  A 7 cm  incision was carried out over this region and electrocautery utilized to  dissect down to the fascial plane.  The left subclavian vein was  subsequently punctured and the Guidant model [id number] active fixation  defibrillation lead was advanced into the right ventricle.  The lead was  placed on the RV septum where R-waves measured 20 mV and the pacing  impedance with the lead actively fixed was 733 ohms.  The pacing threshold  was 0.4 volts at 0.5 milliseconds.  10 volt pacing did not stimulate the  diaphragm.  With these satisfactory parameters, the leads were secured to  the pectoralis fascia with a figure-of-eight silk  suture and the sewing  sleeve was also secured with a silk suture.  At this point, electrocautery  was utilized to make a subcutaneous pocket and Kanamycin irrigation was  utilized to irrigate the pocket.  The Guidant Vitality II ICD model, T175,  serial number [id number], was connected to the defibrillation lead and placed in  the subcutaneous pocket.  The generator was secured with a silk suture.  Additional kanamycin was utilized to irrigate the pocket and defibrillation  threshold testing was carried out.   After the patient was more deeply sedated with fentanyl and Versed, VF was  induced with a T-wave shock.  A 14 joules shock was subsequently delivered  which terminated ventricular fibrillation and restored sinus rhythm.  Five  minutes was allowed to elapse and a second VFT test carried out.  Again, VF  was induced with T-wave shock and a second 42 joules shock was then  delivered which terminated VF and restored sinus rhythm.  At this point, no  additional defibrillation threshold testing was carried out and the  incision  was closed with layer of 2-0 Vicryl followed by layer of 3-0 Vicryl followed  by a layer of 4-0 Vicryl.  Benzoin was painted on the skin, Steri-Strips  were applied, and a pressure dressing was placed.  The patient was returned  to his room in satisfactory condition.   COMPLICATIONS:  There were no immediate procedure complications.   RESULTS:  This demonstrates successful implantation of a Guidant single  chamber defibrillator in a patient with a nonischemic cardiomyopathy and  congestive heart failure as part of CRT study.           ______________________________  Doylene Canning. Ladona Ridgel, M.D.     GWT/MEDQ  D:  07/14/2005  T:  07/15/2005  Job:  [id number]   cc:   Cecil Cranker, M.D.  [address]  [address]  [place]  [place] [address]

## 2010-06-24 NOTE — Assessment & Plan Note (Signed)
[facility]                             PULMONARY OFFICE NOTE   [name], [name]                        MRN:          [id number]  DATE:03/08/2006                            DOB:          03-15-1940    HISTORY:  A 70 year old black male seen at Dr. Odessa Fleming request for  evaluation of wheezing and dyspnea that have totally resolved off ACE  inhibitors.  He had a progressive decline for the last year to the point  where he could only make it about 50 feet before he would give out.  This has completely resolved now, and he is back to his baseline off ACE  inhibitors and on Benicar 40/12.5 one daily.  He has had several  episodes of dizziness, which he describes as a chronic problem that is  not definitely orthostatic, and was seen in the emergency room with  another episode recently, but no definite diagnosis was made, and he did  not appear to be orthostatic.   For full formulary medications, please see face sheet dated March 08, 2006.  The patient is dependent on his wife for his medicines, and is  struggling a little bit with formulary restrictions.   PHYSICAL EXAMINATION:  He is a pleasant, ambulatory, black male, in no  acute distress.  He is slightly hoarse.  Afebrile.  Normal vital signs with no orthostatic changes in his blood  pressure.  His lungs fields are completely clear bilaterally to auscultation and  percussion, although breath sounds are relatively distant, and  inspiratory and expiratory time is short.  Regular rhythm without murmur, gallop or rub.  ABDOMEN:  Obese and benign.  EXTREMITIES:  Warm without calf tenderness, cyanosis, clubbing or edema.   CT scan was reviewed from December 6th, and shows nonspecific  mediastinal hilar adenopathy.  Chest x-ray done January 6th shows  cardiomegaly without any definite infiltrates, effusions or adenopathy.   IMPRESSION:  Multiple issues discussed with the patient, spending an  extra  15 to 25-minute visit on the following topics:  1. First, we reviewed his pulmonary function tests, which show purely      restrictive pattern with the distal portion of reduction in      expiratory reserve volume, which is classic with the effects of      obesity.  2. He does have a reduction in diffusing capacity,      probably related to heavy smoking, and he definitely needs to quit.      However, he does not have significant airflow obstruction.  2. All of his symptoms are resolved off ACE inhibitors, which is the      good news.  The bad news is he may no longer be motivated to stop      smoking, which is probably the most important thing he can do,      along with lose weight.  3. Dizziness.  Does not appear to be orthostasis, and therefore, I      am going to recommend he continue the Benicar for now at its  present dose, but understand that he is struggling a bit with      issues related to adherence and formulary changes.  I have offered      the services of our nurse practitioner to sort through all of his      medicines if he will bring them back with his wife and his      formulary in 2 weeks.  4. Finally, the nonspecific adenopathy is worrisome, but by itself      does not warrant any form of pulmonary intervention at this point.      Followup chest x-ray was done today, and then needs to be repeated      in 3 months.  This can be done by Dr. Debby Bud, and compared using      the same jacket we have here.  If there is evidence of microscopic      adenopathy, then definitely it would be worth considering      bronchoscopy with a trans needle biopsy, but early diagnosis of      mediastinal disease is not likely to have any positive impact on      this patient's overall care, and most likely this is benign      adenopathy based on his dramatic and convincing reverse of his      symptoms simply by taking him off ACE inhibitors.  Pulmonary      followup can, therefore,  p.r.n.     Charlaine Dalton. Sherene Sires, MD, [facility]  Electronically Signed    MBW/MedQ  DD: 03/08/2006  DT: 03/08/2006  Job #: [id number]   cc:   Rosalyn Gess. Debby Bud, MD  Duke Salvia, MD, [facility]

## 2010-06-24 NOTE — Assessment & Plan Note (Signed)
[facility]                             PULMONARY OFFICE NOTE   JAKIN, PAVAO                        MRN:          [id number]  DATE:01/17/2006                            DOB:          25-Oct-1940    PULMONARY CONSULTATION:   REASON FOR CONSULTATION:  Adenopathy/dyspnea.   HISTORY:  A 70 year old black male active smoker with a greater than 1-  year history of dyspnea to the point where he says he cannot walk 50  feet without giving out.  Associated with this dyspnea he has noticed  hoarseness and a dry, raspy cough with the sensation of choking at the  level of his suprasternal notch.  However, he denies any excessive  sputum, orthopnea, PND or nocturnal respiratory complaints.   Dr. Graciela Husbands was concerned about adenopathy seen on CT scan that was done  for evaluation of dyspnea.  The patient denies any dysphagia or fevers,  chills, sweats or unintended weight loss.   PAST MEDICAL HISTORY:  1. Hypertension.  2. CHF.  3. Diabetes.  4. Hyperlipidemia.   ALLERGIES:  None known.   MEDICATIONS:  ACE inhibitors chronically.  For full ambulatory with  dosing, please see face sheet column dated January 17, 2006.   SOCIAL HISTORY:  He smokes a pack per day and has done so most of his  adult life.  He denies any unusual travel, pet or hobby or occupational  exposure history.   FAMILY HISTORY:  Recorded in detail on the worksheet and significant for  the absence of respiratory diseases or atopy.   REVIEW OF SYSTEMS:  Also taken in detail on the worksheet and  significant for frequent acid heartburn symptoms.   PHYSICAL EXAMINATION:  GENERAL:  This is an ambulatory, hoarse black  male in no acute distress.  VITAL SIGNS:  He has stable vital signs.  Weight of 233 pounds.  HEENT:  Significant for the absence of turbinate edema or excessive  postnasal drainage or cobblestoning.  Dentition is intact.  Ear canals  clear bilaterally.  NECK:  Supple  without cervical adenopathy or tenderness.  Trachea is  midline.  LUNGS:  Lung fields are perfectly clear bilaterally to auscultation and  percussion except for classic pseudo-wheeze.  CARDIAC:  There is a regular rhythm without murmur, gallop, or rub.  ABDOMEN:  Obese, abd is soft and benign with normal excursion in the  supine position.  EXTREMITIES:  Warm without calf tenderness, cyanosis, clubbing or edema.   Chest CT scan was reviewed from December 6 and shows nonspecific  mediastinal adenopathy.   IMPRESSION:  1. Classic upper airway obstruction typical in a patient with reflux      or an ACE inhibitor intolerance or both.  My own experience is that      reflux destabilizes the upper airway by inflaming it and then ACE      inhibitors contribute to the problem by allowing the bradykinin      level build in response to the inflammation caused by reflux.  I note that he is already on ranitidine but it is  not adequate for  reflux.  I therefore recommend adding Prevacid 30 mg tablets taken 30-60  minutes before breakfast for a 4-week trial and add ranitidine 75 mg at  bedtime.  I would like him to switch from lisinopril to Benicar 40/12.5 mg one  daily.  I gave him a 5-week trial basis.  If his symptoms completely  resolve with adequate treatment of reflux, an argument could be made to  restart the ACE inhibitor if Dr. Graciela Husbands feels it is essential to his  care.  Otherwise, I would avoid this class entirely if possible.  1. The adenopathy is nonspecific and of no immediate consequence.      However, I would recommend a follow-up chest x-ray as well as PFTs      in 6 weeks because the patient is an active smoker.  If this      represents malignancy, it is already advanced and therefore early      diagnosis is not a relevant issue in this particular case.     Charlaine Dalton. Sherene Sires, MD, [facility]  Electronically Signed    MBW/MedQ  DD: 01/18/2006  DT: 01/18/2006  Job #: [id number]   cc:    Duke Salvia, MD, [facility]  Rosalyn Gess. Norins, MD

## 2010-06-24 NOTE — Cardiovascular Report (Signed)
NAME:  ARKEEM, HARTS NO.:  [id number]   MEDICAL RECORD NO.:  [id number]          PATIENT TYPE:  INP   LOCATION:  4704                         FACILITY:  [facility]   PHYSICIAN:  Rollene Rotunda, M.D.   DATE OF BIRTH:  [birth year]   DATE OF PROCEDURE:  DATE OF DISCHARGE:                              CARDIAC CATHETERIZATION   PRIMARY PHYSICIAN:  Rosalyn Gess. Norins, M.D. [facility].   CARDIOLOGIST:  Cecil Cranker, M.D.   PROCEDURES:  Left and right heart catheterization/coronary arteriography.   INDICATIONS:  Evaluate patient with chest pain suggestive of unstable  angina.  He has a cardiomyopathy.  He had previous nonobstructive coronary  disease.   PROCEDURES:  Left heart catheterization is performed via the right femoral  artery.  Right heart catheterization is performed via the right femoral  vein.  Both vessels were cannulated using anterior wall puncture.  A number  6 French arterial sheath, number 8 French venous sheath were inserted via  the modified Seldinger technique.  Preformed Judkins and a pigtail catheter  were utilized. The patient tolerated the procedure well and left the lab in  stable condition.   RESULTS:   HEMODYNAMICS:  RA mean 13, RV 43/13, PA 44/28 with a mean of 34, pulmonary  capillary wedge pressure mean 24, LV 172/39, AO-1 73/80, cardiac  outputs/cardiac index (FICK), 4.4/2.0.  Coronaries left main was normal.  The LAD had paroxysmal moderate calcification.  There was 25-30% proximal  stenosis.  The first diagonal was small with ostial of 50% stenosis.  The  second diagonal was small with ostial 80% stenosis.  Circumflex in the AB  groove was small and normal.  There was very large branching ramus  intermediate which was normal.  The right coronary artery was a dominant  vessel.  It had diffuse nonobstructive disease.  There was a long proximal  50% stenosis.  There was a long mid 40% stenosis.  There was distal long 30-  40% stenosis  before the PDA.  PDA was moderate sized and normal.  There were  2 posterolateral's with a moderate size to normal.   LEFT VENTRICULOGRAM:  The left ventriculogram was obtained in the RAO  projection.  The EF was 30% with global hypokinesis.   CONCLUSION:  1.  Nonobstructive coronary artery disease.  2.  Nonischemic cardiomyopathy with moderate left ventricular dysfunction.   PLAN:  The patient will continue on medical management.  He will be  counseled on the need to stop smoking.  He will need primary risk reduction  with particular attention to his cholesterol.  He has been advised to stop  drinking altogether as this may need to be contributing.  We will continue  to titrate his medications.  Towards that end, I will increase his Coreg to  6.25 today but discontinue his Norvasc.  He will continue on the current  dose of Lisinopril.           ______________________________  Rollene Rotunda, M.D.     JH/MEDQ  D:  03/08/2005  T:  03/08/2005  Job:  [id number]  cc:   Rosalyn Gess. Norins, M.D. [facility]  [address]  [place]  [place] [address]

## 2010-06-24 NOTE — Assessment & Plan Note (Signed)
[facility]                         ELECTROPHYSIOLOGY OFFICE NOTE   PHILIPPE, GANG                        MRN:          [id number]  DATE:01/11/2006                            DOB:          Apr 23, 1940    His cardiologist is Dr. Sarajane Jews.  Primary caregiver is Dr. Illene Regulus.   ALLERGIES:  He has no known drug allergies.   The patient presents today for a followup as part of the MADIT-CRT  study.  His device was interrogated, and the personnel there noted that  the patient had abnormal auscultation of the lungs.  On further  questioning, the patient does admit that he has had dyspnea for about a  month, that this has been progressive.  He has had a persistent cough.  When he takes a deep breath he has pain in the left shoulder and in the  left axillary area.  He exhibits some sputum, but this cough is not  productive of colorful or even copious sputum.  On auscultation pleural  rubs were identified, left greater than right.  There was scattered  rhonchi as well.  The patient had a recent office visit with Dr. Debby Bud,  and was started on an antibiotic now 8 days ago for dysuria/prostatitis  with resolving symptoms.   The patient has a history of cardiac problems.  A Myoview study was done  as far back as 2002, ejection fraction was 51%.  There was a question of  some apical ischemia.  He presented in January of 2007 with chest pain  and discomfort, and Dr. Corinda Gubler set him up with a catheterization.  This  was done in January of 2007.  Surprisingly, the study showed an ejection  fraction of 30% with global hypokinesis.  The LAD had a 25% to 30%  proximal stenosis, the first diagonal had a 50% ostial stenosis, the  second diagonal had an 80% ostial stenosis, both the diagonals were  small.  The circumflex gave away to a ramus intermediate which was  large, but had no significant stenoses.  The right coronary artery had  diffuse  nonobstructive disease of 40% to 50% proximal stenosis, 40%  midpoint stenosis and 30% to 40% distal stenosis.  The patient also  exhibits right bundle branch block.  He was started on the maximal  medial therapy for a nonischemic cardiomyopathy.  He was reassessed in  June of 2007.  Echocardiogram showed a persistent ejection fraction of  30%.  There was a virtual akinesis of the posterolateral, inferoseptal  and inferior walls, trivial mitral regurgitation.  The patient was  randomized to the MADIT-CRT study, and a Guidant Vitality single chamber  cardioverter defibrillator was implanted July 14, 2005.  The patient in  the past has exhibited class II NYHA congestive heart failure with  chronic systolic congestive heart failure features.   The patient says that he is fairly okay walking on level ground, but  gets short of breath when climbing one flight of stairs or he has  trouble whenever the ground becomes inclined.   In the past there has  been some question of a right costophrenic angle  density.  In the past it has been thought to be secondary to epicardial  fat pad, but followup x-rays have been taken.  The first note of this  was made in January of 2007.  The followup chest x-ray was taken in  March of 2007 showing cardiophrenic opacity likely fat, recommending  followup in 6 months.  Chest x-ray was therefore taken today, December  6, to help [place] in this patient with not only this past history of right  costophrenic angle density, but also a new onset of shortness of breath.  The chest x-ray showed some basilar atelectasis, some hilar opacities as  well as ascending pulmonary vascular prominences.  It will take more  experience than this dictator has to analyze for a right costophrenic  angle opacity.  There is fluid in the right middle lobe fissure as well.   1. The questions with this patient at this time, who has pleural rubs      left greater than right, one month of  progressive dyspnea and      persistent cough, would be perhaps the patient has some element of      failure and needs a diuretic, and currently he is not on any      diuretic.  2. Perhaps there is a question of pleuritis, and the patient would      benefit from a strong NSAID.  3. Perhaps the patient has pleurisy, but the patient has been on an      antibiotic for the past 7 days, and then there is also the      possibility of a pulmonary embolism.  To this extent, the patient      will now go for computed tomogram.   PAST MEDICAL HISTORY:  Includes as well as the cardiac features  mentioned above:  1. Hypertension.  2. Dyslipidemia.  3. 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  4. GERD.  5. Ongoing tobacco habituation.  As early as January he was counseled      to cease smoking.  The patient at that time was smoking 2 packs per      day, currently he is down to 2 packs for the last four to five      days, but is still smoking.  6. Alcoholic beverages.  The patient drinks very occasionally now.   PHYSICAL EXAMINATION:  VITAL SIGNS:  The patient's blood pressure is  160/103, his weight is 230, heart rate is 92 and regular.  He is alert and oriented x3, no particular acute distress.  He does  ambulate quite carefully, with a measured slow gait.  NECK:  Supple without carotid bruits auscultated.  HEART:  Regular rate and rhythm.  LUNGS:  Sound congested and tight, there are pleural rubs evident on  deep inspiration, the left greater than right, and only few rales in the  dependent areas of the lungs.  ABDOMEN:  Obese, bowel sounds are present, and the abdomen is  nondistended.   Of note, the patient and his wife were concerned on October 22 when they  called our office, saying that the patient has been congested, very  short of breath, with chest and arm pain, coughing up phlegm, and were  advised to go to urgent care or the emergency room  for evaluation.  CURRENT MEDICATIONS:  1. Novolin 70/30 per sliding scale.  2.  Zocor 40 mg daily at bedtime.  3. Lisinopril 40 mg daily.  4. Enteric-coated aspirin 325 mg daily.  5. Zantac 75 mg b.i.d.  6. Coreg 6.25 mg b.i.d.   PLAN:  1. The patient would benefit from increase in Coreg to 12.5 mg b.i.d.  2. Addition of a diuretic with potassium supplementation.  The      patient's creatinine on November 30 was 1.1.  3. We will check to see what the results of the CT of the chest do      indicate for [place] in the way of pulmonary embolism or possible      parenchymal mass.  The patient would benefit from addition of Lasix      and potassium.  He is once again counseled to cut down on smoking      as drastically as he can.  The patient will need followup in one      week to see if there has been any improvement in his situation.      Ultimately the patient might benefit from rechecking his ejection      fraction.  If maximum medical therapy does not help him much, he      may benefit from cardiac resynchronization therapy added to his      cardioverter defibrillator.   ADDENDUM:  This covers a CT of the chest which was obtained in this  patient with 1 month of dyspnea and 1 month of persistent cough to rule  out pulmonary embolism.  The CT was done on December 6 with contrast.  The patient did not have a pulmonary embolism.  He had mild bibasilar  atelectasis.  There was borderline mediastinal adenopathy and a single,  enlarged 3.9 x 1.6 cm right subcarinal nodal mass.  The readers could  not exclude neoplasm.  The patient is ready for going home today and the  following measures have been taken prior to him going:  1. He will have a pulmonary followup to perhaps biopsy or assay this      carinal adenopathy.  2. Blood pressure was 160 and he has been on 6.25 mg b.i.d. of Coreg      for the year of 2007.  This Coreg will be advanced to 12.5 mg twice      daily.  By the way, the  patient tells me that he has been out of      Coreg, I did not ask how long.  3. The patient will be given Lasix 40 mg in the morning, 20 mg in the      evening.  He will have potassium chloride supplementation 20 mEq in      the morning and 10 mEq in the evening.  He will be started on      Indomethacin 25 mg p.o. t.i.d. with food for a suspected pleuritis      manifested by a quite pronounced pleural rub, left greater than      right and pronounced pain on deep inspiration.  The patient will      return to [facility] office Friday, December 6 for a brain      natriuretic peptide measure blood work; and, at that time, he will      be given referral to the pulmonary service.      Maple Mirza, [place]       Veverly Fells. Excell Seltzer, MD    GM/MedQ  DD: 01/11/2006  DT: 01/12/2006  Job #: [id number]

## 2010-06-24 NOTE — Assessment & Plan Note (Signed)
[facility]                            CARDIOLOGY OFFICE NOTE   [name], [name]                        MRN:          [id number]  DATE:01/11/2006                            DOB:          [birth year]    NO DICTATION.     Maple Mirza, PA  Electronically Signed    GM/MedQ  DD: 01/11/2006  DT: 01/12/2006  Job #: [id number]

## 2010-06-24 NOTE — H&P (Signed)
NAME:  [name], [name] NO.:  [id number]   MEDICAL RECORD NO.:  [id number]                   PATIENT TYPE:  EMS   LOCATION:  [facility]                                 FACILITY:  [facility]   PHYSICIAN:  Rosalyn Gess. Norins, M.D. [facility]         DATE OF BIRTH:  12-Jan-1941   DATE OF ADMISSION:  05/26/2002  DATE OF DISCHARGE:                                HISTORY & PHYSICAL   CHIEF COMPLAINT:  Abdominal pain.   HISTORY OF PRESENT ILLNESS:  [name] is a 70 year old married black male, presents reporting the onset  of left lower quadrant abdominal pain last night.  Patient's family gives  the following history:  He has been on a binge since Thursday, 05/22/02,  returning home early Saturday morning, the 17th.  He slept most of the day  and then, apparently, he consumed half a bottle of rubbing alcohol.  Family,  on Sunday morning, could not get him to come to the emergency department for  evaluation, and he drank another half a bottle of rubbing alcohol.  He slept  most of the day Sunday.  He awoke on the morning of admission complaining of  abdominal pain mostly in the left lower quadrant.  He reports he had one  episode of nausea and vomiting.  With his emesis, his report, having blood  in it.  He denies rigors.  He has had no fever.  He reports his last bowel  movement was two or three days prior to admission.  He does report he has  been passing dark stool.  Because of abdominal pain, he had an ambulance  called, and the patient was brought to the emergency department for  evaluation.   The patient is followed for multiple medical problems.  He has not been  taking any of his medications.  Of note, his laboratory work and an ER  evaluation are unremarkable.  He is typically lacking any  anion gap or metabolic acidosis.  CBC was normal,  He does admit to  abdominal pain.   LABORATORY DATA:  A CT of the abdomen and pelvis was performed, which raised the question  of  possible left colon diverticulitis.   PLAN:  Patient is now admitted for p.o. antibiotics, observation with possible  alcohol withdrawal.  Patient did state that when he drank the rubbing alcohol, he was acting out  on suicidal ideation but denies any actual suicidal ideation at this time.   PAST SURGICAL HISTORY:  Patient has had back surgery in the past.   PAST MEDICAL HISTORY:  1. 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  2. History of hypertension.  3. History of  insulin-dependent diabetes.  4. History of known coronary artery disease, with last cardiac     catheterization May 19, 2000 , revealing diagonals 80% stenosis.  Circumflex was okay.  RCA was 40% stenosis.  Patient has a LV ejection     fraction of 35% with relative hypokinesis  5. Patient with noted peripheral vascular disease with last carotid Dopplers     being performed in April of 2000 with mild-to-moderate plaque, with no     significant obstruction.    CURRENT MEDICATIONS:  1. Insulin 70/30, 23 units q.a.m., 12 units q.p.m.  2. Aspirin daily.  3. Zocor 20 mg daily.  4. Norvasc 5 mg daily.  5. Ranitidine 150 mg b.i.d.  6. Altace 10 mg b.i.d.   FAMILY HISTORY:  Noncontributory.   SOCIAL HISTORY:  Patient is retired.  He has been married now for 37 years.  He has four  children.  Patient continues to be a binge drinker.  He continues to smoke a  pack and a half of cigarettes per day.  He continues to abuse alcohol and  evidently other drugs, with a positive drug screen this admission.   REVIEW OF SYSTEMS:  Negative except for the HPI, specifically denying any significant chest pain  or chest discomfort.   PHYSICAL EXAMINATION:  VITAL SIGNS:  Temperature is 97.2; blood pressure was 159/75; pulse is 80;  respirations are 18; O2 sat was 96% on room air.  GENERAL APPEARANCE:  This is a poorly shaved black male in no acute   distress.  HEENT:  Normocephalic/atraumatic.  Conjunctivae are clear, with clear  oropharynx without lesions.  NECK:  Supple without thyromegaly.  No adenopathy was noted in the cervical  or supraclavicular regions checked.  CHEST:  Patient is moving air well with no rales, wheezes or rhonchi.  CARDIOVASCULAR:  2+ radial pulses,  no JVD or carotid bruit.  He had a quiet  precordium with regular rate and rhythm, without murmurs.  ABDOMEN:  Soft with positive bowel sounds in all four quadrants.  He had  tenderness to palpation in the left lower quadrant.  No guarding was noted.  There was no rebound tenderness.  RECTAL:  Exam deferred at this time.  EXTREMITIES:  Without clubbing, cyanosis, edema, or deformity with good  peripheral pulses.   LABORATORY:  White count 7, 800 with a normal differential, hemoglobin 13.9, hematocrit  41.6, platelet count 220,000.  Chemistries with sodium 141, potassium 3.2,  chloride 102, CO2 of 26, glucose 190, BUN of 12, creatinine 1.8, calcium  8.7, total protein 7.3.  Liver functions are normal.  Alkaline phosphatase  normal.  Bilirubin total was 0.8 and normal.  Alcohol level was less than 5  mg/dL.  UA was unremarkable except for a specific gravity of 1.024; he had 0-  3 WBCs per high-power field and rare bacteria.  Drug screen was positive for  opiates, positive for cocaine.  CT scan of the abdomen, itself, reveals  impression of very mild bowel-wall-thickening suggestive of possible  diverticulitis and a distended colon versus sigmoid colon.   ASSESSMENT/PLAN:  1. Gastrointestinal.  Patient is a poor historian.  Now is presenting with     abdominal pain/possible diverticulitis.  He is afebrile with a normal     white count with no guarding or rebound.  Also question of alcoholic     gastritis.  Given his alcohol ingestion and his abdominal pain and     question of dark stools, he needs to be observed for 24 hours and check     CBC. We will hydrate  the patient.  We will treat him with Augmentin p.o. 875 mg  b.i.d..  We will check serial CBC, to both watch for infection as well as  watching for drop in hemoglobin; p.o. Vicodin for discomfort.  Should he do  well, he would be a good candidate for follow-up outpatient colonoscopy.  1. Substance abuse.  Patient with significant alcohol ingestion, but alcohol     level was less than 0.5.  Patient did drink rubbing alcohol with question     of possible gastritis secondary to that.  Patient also with cocaine and     opiates on board.  Ativan 1 mg p.o. or IV q.6h. while observing him.  2. Diabetes.  Patient is an insulin-dependent diabetic.  He has not taken     any of his medications for several days.  Incidentally, his last glucose     was unremarkable at 190 when I expected it to be higher.  We will follow     a sliding scale overnight.  We will plan to restart a diet in the morning     and probably send him home on his regular regimen.  3. Cardiovascular.  Patient with a known history of significant coronary     artery disease, medically managed.  He is not complaining of any chest     pain.  Twelve-lead electrocardiogram and also check CK-MB and troponins x     2.  4. Hyperlipidemia.  Patient is on Zocor as an outpatient.  We will check     lipid profile.  5. Psychologic.  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  Patient seems to have insight into his     addiction.  Ativan as noted above.  I think he is a candidate for     outpatient referral.  He seems to be interested in treatment.                                                   Rosalyn Gess Norins, M.D. [facility]    MEN/MEDQ  D:  05/26/2002  T:  05/27/2002  Job:  [id number]

## 2010-06-24 NOTE — Assessment & Plan Note (Signed)
[facility]                           PRIMARY CARE OFFICE NOTE   [name], [name]                        MRN:          [id number]  DATE:01/15/2006                            DOB:          1940/11/27    Mr. Dillinger was just recently seen for general examination, please see  that prior dictation.  His laboratory was returned from January 05, 2006, showing that his cholesterol is very well-controlled at 120 with  an LDL of 76 and an HDL of 30.5.  Potassium and BMET are normal.  Creatinine is 1.1.  Liver functions are normal.  A1c was 8.2%.  In  talking with the patient, he reports that he has morning blood sugars  before breakfast that are in the 100 range.  He reports that his before-  supper blood sugar is in the 150, 160 or 170 range.  The patient does  admit to dietary indiscretion including the use of ice cream at night or  milk shakes or night.  We did discuss the need for good control of his  blood sugar.   PLAN:  The patient is to continue on Humulin 70/30 at his present home  dose of 30 units q.a.m. and 20 units q.p.m.  Will add metformin 500 mg  b.i.d. to supplement this for better control.   The patient will continue his other medications including:  1. Carvedilol 12.5 mg daily.  2. Simvastatin 40 mg daily.  3. Lisinopril 40 mg daily.  4. Furosemide 40 mg a.m. and 20 mg q.p.m.  5. Potassium 20 mEq in the morning, 10 mEq in the evening.  6. Aspirin 325 mg d.  7. Ranitidine 75 mg once or twice daily.  8. The patient is to use indomethacin 25 mg three times a day for      chest pain or chest wall pain or arthritic pain but not to take      this on a regular basis.   Discussed this at length with the patient and his wife.  They agree to  this regimen.  I did provide them with a Cure Notes copy of a diabetic  exchange diet so he has a better idea about appropriate portion size as  well as food choices.  Would add that the patient will  notify me if his  blood sugars start to run too low or if he has difficulty with control.  He will return in 3 months for a repeat hemoglobin A1c with a goal of 7%  or less.     Rosalyn Gess Norins, MD  Electronically Signed    MEN/MedQ  DD: 01/15/2006  DT: 01/16/2006  Job #: [id number]   cc:   Lauris Chroman

## 2010-06-24 NOTE — H&P (Signed)
[place]  Patient:    [name], [name] Visit Number: [id number] MRN: [id number]          Service Type: MED Location: 1800 1825 01 Attending Physician:  Cathren Laine Dictated by:   Madolyn Frieze. Jens Som, M.D. [facility] Admit Date:  01/08/2001                           History and Physical  HISTORY OF PRESENT ILLNESS:  [name] is a 70 year old gentleman with a past medical history of moderate coronary artery disease by catheterization in 2000, ejection fraction 35%, diabetes mellitus, hypertension, and substance abuse, who presents for evaluation of chest pain.  The patients catheterization on May 20, 1998 showed an 80% diagonal but, otherwise, nonobstructive disease.  His ejection fraction was 35%.  Since then he has had occasional vague chest pain that is not related to exertion.  Last night the patient smoked crack cocaine and also consumed two glasses of gin.  This morning he awoke with substernal chest pain described as a pressure.  There was some radiation to his back.  There was associated nausea and vomiting, but there was no shortness of breath or diaphoresis.  The pain had a duration of approximately two hours and resolved in the emergency room.  He is presently pain-free.  Of note, it was not pleuritic or positional.  He denies any dyspnea on exertion, and there is no orthopnea or PND.  There has been no syncope.  He did have associated palpitations earlier this a.m.  For details of his past medical history, social history, family history, and review of systems, please refer to the note handwritten by Tereso Newcomer. Please also refer to that note for medication list.  PHYSICAL EXAMINATION:  VITAL SIGNS:  Blood pressure 191/97 in the left arm, and 203/106 in the right. His pulse is 74.  His respiratory rate is 20, and he is afebrile.  He is 96% on room air.  GENERAL:  Well-developed, well-nourished.  In no acute distress.  SKIN:  Warm  and dry.  HEENT:  Unremarkable, with normal eyelids.  NECK:  Supple.  With a normal upstroke bilaterally.  There are no bruits noted.  There is no jugular venous distention and no thyromegaly noted.  CHEST:  Clear to auscultation.  Normal expansion.  CARDIOVASCULAR:  Regular rate and rhythm.  There is a 1/6 systolic ejection murmur at the left sternal border.  There are no rubs or gallops noted.  ABDOMEN:  Nontender, nondistended.  Positive bowel sounds.  No hepatosplenomegaly.  No masses appreciated.  There is no abdominal bruit.  He has 2+ femoral pulses bilaterally.  No bruits.  EXTREMITIES:  No edema.  There are no cords palpated.  There are 2+ dorsalis pedis pulses bilaterally.  NEUROLOGIC:  Grossly intact.  LABORATORY DATA:  Electrocardiogram shows a normal sinus rhythm with a right bundle branch block, and no significant ST changes.  Hemoglobin 13.8, hematocrit 40, white blood cell count 9.5.  BUN and creatinine are 14 and 0.8.  Alcohol level is 22.  CK-MB and troponin are normal for the initial values.  Chest x-ray shows mild congestion.  DIAGNOSES: 1. Chest pain. 2. Substance abuse, including crack cocaine use. 3. History of cardiomyopathy with an ejection fraction of 35%. 4. History of 80% diagonal but, otherwise, nonobstructive disease by    catheterization. 5. Hypertensive urgency. 6. Diabetes mellitus. 7. Right bundle branch block. 8. Hyperlipidemia.  PLAN:  Mr. Tallerico presents for evaluation of chest pain.  This occurred in the setting of crack cocaine use.  He is now pain-free, and his electrocardiogram is normal.  We will admit and rule out myocardial infarction with serial enzymes.  If his enzymes are negative, then we will plan to risk stratify with a stress Cardiolite tomorrow.  He does have cardiac risk factors and, also, cocaine can cause accelerated atherosclerosis.  We will also check a drug screen.  For now, we will continue with his aspirin as  well as his Altace, and we will add IV nitroglycerin to help control his blood pressure.  We will hold his beta blocker, given his recent cocaine use.  This will be resumed prior to discharge if his enzymes are negative.  We will add Zocor to his medical regimen given his history of diabetes mellitus and coronary disease.  He will need cholesterol and liver functions checked in approximately six weeks.  Of note, he discontinued his previous Lipitor secondary to upper extremity pain. Finally, we will check a chest CT to rule out aortic dissection, although I think this is unlikely (the patient did have some back pain earlier this morning, as well, in relation to his increased blood pressure).  We will not add anticoagulation at this point, given his elevated blood pressure.  If he has recurrent pain, then we will add it at that time. Dictated by:   Madolyn Frieze. Jens Som, M.D. [facility] Attending Physician:  Cathren Laine DD:  01/08/01 TD:  01/08/01 Job: [id number]

## 2010-06-24 NOTE — Assessment & Plan Note (Signed)
[facility]                         ELECTROPHYSIOLOGY OFFICE NOTE   NAZIRE, FRUTH                        MRN:          [id number]  DATE:03/22/2006                            DOB:          06-22-1940    SUBJECTIVE:  Mr. Vandergrift is returns today for followup.  He is a very  pleasant middle-aged man with hypertension and an ischemic  cardiomyopathy and congestive heart failure.  The patient is status post  ICD implantation.  He returns today for followup.  He had no specific  complaints today.   OBJECTIVE:  GENERAL:  He is a pleasant middle-aged man in no distress.  VITAL SIGNS:  Blood pressure is 150/100, the pulse was 90 and regular,  the respirations are 18, the weight was not recorded.  HEENT:  Normocephalic and atraumatic, pupils equal and round, oropharynx  moist and clear, sclerae are anicteric.  NECK:  Revealed no jugular venous distention.  CHEST:  Lungs were clear bilaterally to auscultation.  Cardiovascular  exam with a regular rate and rhythm with normal S1 and S2.  EXTREMITIES:  The extremities demonstrated no cyanosis, clubbing or  edema.  The pulses are 2+ and symmetric.   IMPRESSION:  1. Ischemic cardiomyopathy.  2. Congestive heart failure.  3. Status post implantable cardioverter/defibrillator insertion.   DISCUSSION:  Overall Mr. Gurka is stable.  His defibrillator is working  normally, R waves today were 25 and his impedance was 667, there were no  intercurrent IC therapies, threshold 0.6 volts, 0.5 msec.  We will plan  to see the patient back in the office for followup in several months.     Doylene Canning. Ladona Ridgel, MD  Electronically Signed    GWT/MedQ  DD: 03/22/2006  DT: 03/22/2006  Job #: [id number]

## 2010-07-07 ENCOUNTER — Telehealth: Payer: Self-pay | Admitting: Internal Medicine

## 2010-07-07 NOTE — Telephone Encounter (Signed)
Left message with son to have Mr. Mathews call and schedule an appointment with Dr. Ladona Ridgel.

## 2010-07-07 NOTE — Telephone Encounter (Signed)
Pt wife has question re pt device. Pt wife states pt was going to get a new device but has not received it. Pt wife wants to know if pt needs see dr Ladona Ridgel to get a check for his device.

## 2010-07-14 ENCOUNTER — Encounter: Payer: Self-pay | Admitting: Internal Medicine

## 2010-07-15 ENCOUNTER — Ambulatory Visit (INDEPENDENT_AMBULATORY_CARE_PROVIDER_SITE_OTHER): Payer: Medicare Other | Admitting: Internal Medicine

## 2010-07-15 ENCOUNTER — Other Ambulatory Visit (INDEPENDENT_AMBULATORY_CARE_PROVIDER_SITE_OTHER): Payer: Medicare Other

## 2010-07-15 ENCOUNTER — Encounter: Payer: Self-pay | Admitting: Internal Medicine

## 2010-07-15 DIAGNOSIS — E1065 Type 1 diabetes mellitus with hyperglycemia: Secondary | ICD-10-CM

## 2010-07-15 DIAGNOSIS — IMO0002 Reserved for concepts with insufficient information to code with codable children: Secondary | ICD-10-CM

## 2010-07-15 DIAGNOSIS — E785 Hyperlipidemia, unspecified: Secondary | ICD-10-CM

## 2010-07-15 DIAGNOSIS — I10 Essential (primary) hypertension: Secondary | ICD-10-CM

## 2010-07-15 DIAGNOSIS — I509 Heart failure, unspecified: Secondary | ICD-10-CM

## 2010-07-15 DIAGNOSIS — I69959 Hemiplegia and hemiparesis following unspecified cerebrovascular disease affecting unspecified side: Secondary | ICD-10-CM

## 2010-07-15 DIAGNOSIS — Z95810 Presence of automatic (implantable) cardiac defibrillator: Secondary | ICD-10-CM

## 2010-07-15 LAB — CBC WITH DIFFERENTIAL/PLATELET
Basophils Absolute: 0 10*3/uL (ref 0.0–0.1)
HCT: 31.4 % — ABNORMAL LOW (ref 39.0–52.0)
Lymphs Abs: 2.2 10*3/uL (ref 0.7–4.0)
MCHC: 34.1 g/dL (ref 30.0–36.0)
MCV: 90.7 fl (ref 78.0–100.0)
Monocytes Absolute: 0.4 10*3/uL (ref 0.1–1.0)
Neutro Abs: 3.4 10*3/uL (ref 1.4–7.7)
Platelets: 150 10*3/uL (ref 150.0–400.0)
RDW: 14.8 % — ABNORMAL HIGH (ref 11.5–14.6)

## 2010-07-15 LAB — COMPREHENSIVE METABOLIC PANEL
AST: 15 U/L (ref 0–37)
Albumin: 3.9 g/dL (ref 3.5–5.2)
BUN: 27 mg/dL — ABNORMAL HIGH (ref 6–23)
Calcium: 8.8 mg/dL (ref 8.4–10.5)
Chloride: 106 mEq/L (ref 96–112)
Glucose, Bld: 165 mg/dL — ABNORMAL HIGH (ref 70–99)
Potassium: 4.3 mEq/L (ref 3.5–5.1)
Sodium: 141 mEq/L (ref 135–145)
Total Protein: 8 g/dL (ref 6.0–8.3)

## 2010-07-15 LAB — HEMOGLOBIN A1C: Hgb A1c MFr Bld: 8.8 % — ABNORMAL HIGH (ref 4.6–6.5)

## 2010-07-15 LAB — LIPID PANEL
Cholesterol: 123 mg/dL (ref 0–200)
LDL Cholesterol: 59 mg/dL (ref 0–99)

## 2010-07-15 LAB — HEPATIC FUNCTION PANEL
ALT: 17 U/L (ref 0–53)
AST: 15 U/L (ref 0–37)
Albumin: 3.9 g/dL (ref 3.5–5.2)
Total Bilirubin: 0.3 mg/dL (ref 0.3–1.2)

## 2010-07-15 NOTE — Progress Notes (Signed)
Subjective:    Patient ID: [name], male    DOB: [birth year], 70 y.o.   MRN: [id number]  HPI Mr. Wimer presents for follow-up CVA, DM, HTN. He has had some elevated sugar readings, but generally ok,  and his wife is concerned about infection. He has had no fever. Had rhinorrhea. He had a cough but this has also cleared.   He continues to work with physical therapy and is hoping to get into a program at [facility]. He has not really had any improvement in right hemiparesis.  He continues to use nocturnal oxygen.   Past Medical History  Diagnosis Date  . 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 Automatic implantable cardiac defibrillator in situ   . Congestive heart failure, unspecified   . Abdominal pain, unspecified site   . Retention of urine, unspecified   . Pneumonia, organism unspecified   . Cough   . Acute bronchitis   . Contusion of foot   . Other sign and symptom in breast   . Myalgia and myositis, unspecified   . Other and unspecified hyperlipidemia   . Coronary atherosclerosis of unspecified type of vessel, native or graft   . Esophageal reflux   . Type I (juvenile type) diabetes mellitus without mention of complication, uncontrolled   . Essential hypertension, benign    Past Surgical History  Procedure Date  . Back surgery   . Tonsillectomy    Family History  Problem Relation Age of Onset  . Coronary artery disease Mother   . Diabetes Sister   . Colon cancer Brother   . Cancer Brother     colon  . Prostate cancer Neg Hx    History   Social History  . Marital Status: Married    Spouse Name: N/A    Number of Children: N/A  . Years of Education: N/A   Occupational History  . 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     Social History Main Topics  . Smoking status: Current Everyday Smoker -- 2.0 packs/day for 55 years    Types: Cigarettes  . Smokeless tobacco: Never Used   Comment:  down to 6 cigarettes a day  . Alcohol Use: No     quit at the time of stroke  . Drug Use: No     quit at the time of his stroke  . Sexually Active: Not Currently   Other Topics Concern  . Not on file   Social History Narrative   7th grade education. Married '67. 3 sons - '67, 72, '74; 1 daughter '69. Worked for [employer]. 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 Several days ago he did have fever and chills HEENT:  Negative for hearing loss, ear pain, congestion, neck stiffness and postnasal drip. Negative for sore throat or swallowing problems. Negative for dental complaints.   Eyes: Negative for vision loss or change in visual acuity.  Respiratory: Negative for chest tightness and wheezing.   Cardiovascular: Negative for chest pain and palpitation. No decreased exercise tolerance Gastrointestinal: No change in bowel habit. No bloating or gas. No reflux or indigestion Genitourinary: Negative for urgency, frequency, flank pain and difficulty urinating.  Musculoskeletal: Negative for myalgias, back pain, arthralgias and gait  problem.  Neurological: Negative for dizziness, tremors. Has right sided weakness Hematological: Negative for adenopathy.  Psychiatric/Behavioral: Negative for behavioral problems and dysphoric mood.   Lab Results  Component Value Date   WBC 6.2 07/15/2010   HGB 10.7* 07/15/2010   HCT 31.4* 07/15/2010   PLT 150.0 07/15/2010   CHOL 123 07/15/2010   TRIG 154.0* 07/15/2010   HDL 33.20* 07/15/2010   ALT 17 07/15/2010   ALT 17 07/15/2010   AST 15 07/15/2010   AST 15 07/15/2010   NA 141 07/15/2010   K 4.3 07/15/2010   CL 106 07/15/2010   CREATININE 1.7* 07/15/2010   BUN 27* 07/15/2010   CO2 31 07/15/2010   TSH 3.15 07/15/2010   PSA 5.22* 04/14/2008   INR 1.13 07/06/2009   HGBA1C 8.8* 07/15/2010   MICROALBUR 35.6*  02/12/2009   Lab Results  Component Value Date   LDLCALC 59 07/15/2010         Objective:   Physical Exam Vitals noted Gen'l - AA man who is in a w/c. He is in no distress HEENT - C&S clear Pul - normal respirations with no increased WOB Cor - RRR Neuro - right hemiparesis with little use of right UE and LE extremities.       Assessment & Plan:

## 2010-07-17 ENCOUNTER — Encounter: Payer: Self-pay | Admitting: Internal Medicine

## 2010-07-17 NOTE — Assessment & Plan Note (Signed)
Good control on present medications. Renal function has deteriorated with Creatinine of 1.7  Plan continue present meds         Adequate hydration to be encouraged.

## 2010-07-17 NOTE — Assessment & Plan Note (Signed)
No clinical evidence of decompensation. He seems to be doing well in this regard

## 2010-07-17 NOTE — Assessment & Plan Note (Signed)
Continues to work with PT and is able to ambulate with a walker in a very limited capacity.  Plan - continue PT and exercise.            Continue risk factor modifications.

## 2010-07-17 NOTE — Assessment & Plan Note (Signed)
A1C reveals poor control of diabetes. Last 3 A1C: 7.9% to 8.1% to 8.8%. Last creatinine precludes the use of metformin.  Plan - return OV to adjust diabetic regimen

## 2010-07-17 NOTE — Assessment & Plan Note (Signed)
Lab revleas that LDL is at goal and LFTs normal  Plan - continue present regimen

## 2010-07-17 NOTE — Assessment & Plan Note (Signed)
He has had no defibrillator discharge. Device pocket without fluctuance or tenderness.  Plan - follow-up with cardiology/EP as instructed

## 2010-07-18 ENCOUNTER — Encounter: Payer: Self-pay | Admitting: Internal Medicine

## 2010-08-02 ENCOUNTER — Other Ambulatory Visit: Payer: Self-pay | Admitting: Internal Medicine

## 2010-08-30 ENCOUNTER — Encounter: Payer: Self-pay | Admitting: Internal Medicine

## 2010-08-30 ENCOUNTER — Ambulatory Visit (INDEPENDENT_AMBULATORY_CARE_PROVIDER_SITE_OTHER): Payer: Medicare Other | Admitting: Internal Medicine

## 2010-08-30 DIAGNOSIS — I10 Essential (primary) hypertension: Secondary | ICD-10-CM

## 2010-08-30 DIAGNOSIS — I509 Heart failure, unspecified: Secondary | ICD-10-CM

## 2010-08-30 DIAGNOSIS — Z95810 Presence of automatic (implantable) cardiac defibrillator: Secondary | ICD-10-CM

## 2010-08-30 DIAGNOSIS — I428 Other cardiomyopathies: Secondary | ICD-10-CM

## 2010-08-30 DIAGNOSIS — I2510 Atherosclerotic heart disease of native coronary artery without angina pectoris: Secondary | ICD-10-CM

## 2010-08-30 NOTE — Assessment & Plan Note (Signed)
His chest pain appears to be non-anginal. He appears to be asymptomatic. He'll continue his current medical therapy.

## 2010-08-30 NOTE — Assessment & Plan Note (Signed)
His device is working normally. We'll plan to recheck in several months. 

## 2010-08-30 NOTE — Assessment & Plan Note (Signed)
His blood pressure remains well controlled. He will continue his current medical therapy and maintain a low-sodium diet.

## 2010-08-30 NOTE — Assessment & Plan Note (Signed)
His chronic systolic heart failure appears to be well compensated. He will continue his current medical therapy and a low-sodium diet.

## 2010-08-30 NOTE — Progress Notes (Signed)
HPI [name] returns today for followup.  He is a very pleasant 70 year old man with a dilated cardiomyopathy. He has a long history of hypertension and is status post stroke with right hemiparesis. He denies any defibrillator shocks. He has occasional episodes of chest pain when he moves his right arm. No shortness of breath, neck, or jaw pain. No syncope. He admits to dietary indiscretion with sodium. Allergies  Allergen Reactions  . Metformin     REACTION: diarrhea     Current Outpatient Prescriptions  Medication Sig Dispense Refill  . carvedilol (COREG) 25 MG tablet Take 12.5 mg by mouth 2 (two) times daily with a meal.        . cloNIDine (CATAPRES) 0.2 MG tablet Take 0.2 mg by mouth 2 (two) times daily.        . famotidine (PEPCID) 20 MG tablet Take 10 mg by mouth 2 (two) times daily.       Marland Kitchen FREESTYLE LITE test strip TEST TWO TIMES A DAY  100 each  3  . furosemide (LASIX) 40 MG tablet Take 40 mg by mouth daily.        . hydrALAZINE (APRESOLINE) 25 MG tablet Take 25 mg by mouth 2 (two) times daily.       . insulin glargine (LANTUS) 100 UNIT/ML injection Inject 20 Units into the skin 2 (two) times daily.       . potassium chloride SA (K-DUR,KLOR-CON) 20 MEQ tablet Take 20 mEq by mouth 2 (two) times daily.        . sertraline (ZOLOFT) 50 MG tablet Take 50 mg by mouth daily.        . simvastatin (ZOCOR) 40 MG tablet TAKE ONE TABLET BY MOUTH EVERY DAY IN THE EVENING  30 tablet  4  . Tamsulosin HCl (FLOMAX) 0.4 MG CAPS Take 0.4 mg by mouth at bedtime.        . finasteride (PROSCAR) 5 MG tablet Take 1 tablet by mouth Daily.         Past Medical History  Diagnosis Date  . 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 Automatic implantable cardiac defibrillator in situ   . Congestive heart failure, unspecified   . Abdominal pain, unspecified site   . Retention of urine, unspecified   . Pneumonia, organism unspecified   . Cough   . Acute bronchitis   . Contusion of foot    . Other sign and symptom in breast   . Myalgia and myositis, unspecified   . Other and unspecified hyperlipidemia   . Coronary atherosclerosis of unspecified type of vessel, native or graft   . Esophageal reflux   . Type I (juvenile type) diabetes mellitus without mention of complication, uncontrolled   . Essential hypertension, benign     ROS:   All systems reviewed and negative except as noted in the HPI.   Past Surgical History  Procedure Date  . Back surgery   . Tonsillectomy      Family History  Problem Relation Age of Onset  . Coronary artery disease Mother   . Diabetes Sister   . Colon cancer Brother   . Cancer Brother     colon  . Prostate cancer Neg Hx      History   Social History  . Marital Status: Married    Spouse Name: N/A    Number of Children: N/A  . Years of Education: N/A   Occupational History  . 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     Social History Main Topics  . Smoking status: Current Everyday Smoker -- 2.0 packs/day for 55 years    Types: Cigarettes  . Smokeless tobacco: Never Used   Comment: down to 6 cigarettes a day  . Alcohol Use: No     quit at the time of stroke  . Drug Use: No     quit at the time of his stroke  . Sexually Active: Not Currently   Other Topics Concern  . Not on file   Social History Narrative   7th grade education. Married '67. 3 sons - '67, 72, '74; 1 daughter '69. Worked for [employer]. 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 Well-healed ICD incision. HEART:  Regular rate rhythm, no murmurs, no rubs, no clicks Abd:  soft, positive bowel sounds, no organomegally, no rebound, no guarding Ext:  2 plus pulses, no edema, no cyanosis, no  clubbing Skin:  No rashes no nodules Neuro:  CN II through XII intact, motor grossly intact  DEVICE  Normal device function.  See PaceArt for details.   Assess/Plan:

## 2010-12-01 ENCOUNTER — Encounter: Payer: Medicare Other | Admitting: *Deleted

## 2010-12-08 ENCOUNTER — Encounter: Payer: Self-pay | Admitting: *Deleted

## 2010-12-08 DEATH — deceased

## 2010-12-24 ENCOUNTER — Other Ambulatory Visit: Payer: Self-pay | Admitting: Internal Medicine

## 2011-01-19 ENCOUNTER — Ambulatory Visit (INDEPENDENT_AMBULATORY_CARE_PROVIDER_SITE_OTHER): Payer: Medicare Other | Admitting: Internal Medicine

## 2011-01-19 ENCOUNTER — Encounter: Payer: Self-pay | Admitting: Internal Medicine

## 2011-01-19 VITALS — BP 120/62 | HR 59 | Temp 97.5°F

## 2011-01-19 DIAGNOSIS — Z23 Encounter for immunization: Secondary | ICD-10-CM

## 2011-01-19 DIAGNOSIS — I509 Heart failure, unspecified: Secondary | ICD-10-CM

## 2011-01-19 DIAGNOSIS — IMO0002 Reserved for concepts with insufficient information to code with codable children: Secondary | ICD-10-CM

## 2011-01-19 DIAGNOSIS — R059 Cough, unspecified: Secondary | ICD-10-CM

## 2011-01-19 DIAGNOSIS — Z1211 Encounter for screening for malignant neoplasm of colon: Secondary | ICD-10-CM

## 2011-01-19 DIAGNOSIS — E1065 Type 1 diabetes mellitus with hyperglycemia: Secondary | ICD-10-CM

## 2011-01-19 DIAGNOSIS — R05 Cough: Secondary | ICD-10-CM

## 2011-01-19 MED ORDER — PROMETHAZINE-CODEINE 6.25-10 MG/5ML PO SYRP: 5.0000 mL | ORAL_SOLUTION | ORAL | Status: AC | PRN

## 2011-01-19 MED ORDER — "INSULIN SYRINGE 29G X 1/2"" 0.5 ML MISC": Status: DC

## 2011-01-19 MED ORDER — BENZONATATE 100 MG PO CAPS: 100.0000 mg | ORAL_CAPSULE | Freq: Three times a day (TID) | ORAL | Status: DC | PRN

## 2011-01-19 NOTE — Patient Instructions (Signed)
Persistent cough - no evidence on exam of a bacterial infection - no fever, lungs are clear, oxygen level is good. Plan - promethazine with codeine cough syrup 1 tsp every 6 hrs; tessalon perles for cough three times a day; mucinex 1200 mg twice a day; plenty of fluids.  Health care: 1. Please stop smoking 2. Will refer you for colonoscopy after the New Year 3. You need to make an appointment with an eye doctor for routine care.  4. Flu shot today.  You Can Quit Smoking If you are ready to quit smoking or are thinking about it, congratulations! You have chosen to help yourself be healthier and live longer! There are lots of different ways to quit smoking. Nicotine gum, nicotine patches, a nicotine inhaler, or nicotine nasal spray can help with physical craving. Hypnosis, support groups, and medicines help break the habit of smoking. TIPS TO GET OFF AND STAY OFF CIGARETTES  Learn to predict your moods. Do not let a bad situation be your excuse to have a cigarette. Some situations in your life might tempt you to have a cigarette.     Ask friends and co-workers not to smoke around you.     Make your home smoke-free.     Never have "just one" cigarette. It leads to wanting another and another. Remind yourself of your decision to quit.     On a card, make a list of your reasons for not smoking. Read it at least the same number of times a day as you have a cigarette. Tell yourself everyday, "I do not want to smoke. I choose not to smoke."     Ask someone at home or work to help you with your plan to quit smoking.     Have something planned after you eat or have a cup of coffee. Take a walk or get other exercise to perk you up. This will help to keep you from overeating.     Try a relaxation exercise to calm you down and decrease your stress. Remember, you may be tense and nervous the first two weeks after you quit. This will pass.     Find new activities to keep your hands busy. Play with a  pen, coin, or rubber band. Doodle or draw things on paper.     Brush your teeth right after eating. This will help cut down the craving for the taste of tobacco after meals. You can try mouthwash too.     Try gum, breath mints, or diet candy to keep something in your mouth.  IF YOU SMOKE AND WANT TO QUIT:  Do not stock up on cigarettes. Never buy a carton. Wait until one pack is finished before you buy another.     Never carry cigarettes with you at work or at home.     Keep cigarettes as far away from you as possible. Leave them with someone else.     Never carry matches or a lighter with you.     Ask yourself, "Do I need this cigarette or is this just a reflex?"     Bet with someone that you can quit. Put cigarette money in a piggy bank every morning. If you smoke, you give up the money. If you do not smoke, by the end of the week, you keep the money.     Keep trying. It takes 21 days to change a habit!     Talk to your doctor about using medicines to help  you quit. These include nicotine replacement gum, lozenges, or skin patches.  Document Released: 11/19/2008 Document Revised: 10/05/2010 Document Reviewed: 11/19/2008 [facility] Patient Information 2012 [place], [place].

## 2011-01-19 NOTE — Progress Notes (Signed)
  Subjective:    Patient ID: [name], male    DOB: 01-Apr-1940, 69 y.o.   MRN: [id number]  HPI [name] with a 4 week h/o cough, productive of mucus  Yellow in color. NO fever, no chills, some SOB., no myalgias, no Sore throat.  [facility] care provider recommends colonoscopy - last study in '99, eye exam which the patient's wife will need to schedule. Flu shot today. Discussed ACP - they are given packet of information to review in this regard.   Past Medical History  Diagnosis Date  . 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 Automatic implantable cardiac defibrillator in situ   . Congestive heart failure, unspecified   . Abdominal pain, unspecified site   . Retention of urine, unspecified   . Pneumonia, organism unspecified   . Cough   . Acute bronchitis   . Contusion of foot   . Other sign and symptom in breast   . Myalgia and myositis, unspecified   . Other and unspecified hyperlipidemia   . Coronary atherosclerosis of unspecified type of vessel, native or graft   . Esophageal reflux   . Type I (juvenile type) diabetes mellitus without mention of complication, uncontrolled   . Essential hypertension, benign    Past Surgical History  Procedure Date  . Back surgery   . Tonsillectomy    Family History  Problem Relation Age of Onset  . Coronary artery disease Mother   . Diabetes Sister   . Colon cancer Brother   . Cancer Brother     colon  . Prostate cancer Neg Hx    History   Social History  . Marital Status: Married    Spouse Name: N/A    Number of Children: N/A  . Years of Education: N/A   Occupational History  . 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     Social History Main Topics  . Smoking status: Current Everyday Smoker -- 2.0 packs/day for 55 years    Types: Cigarettes  . Smokeless tobacco: Never Used   Comment: down to 6 cigarettes a day  . Alcohol Use: No     quit at the time of  stroke  . Drug Use: No     quit at the time of his stroke  . Sexually Active: Not Currently   Other Topics Concern  . Not on file   Social History Narrative   7th grade education. Married '67. 3 sons - '67, 72, '74; 1 daughter '69. Worked for [employer]. 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     Objective:   Physical Exam Vitals reviewed - no fever, O2 sat is 98% Gen'l - older AA man in a w/c with right hemiparesis HEENT- no sinus tenderness Chest - good BS, w/o rales, wheezes or increased WOB Cor - RRR Derm- subcutaneous hemorrhages on forearm. Neuro - A&O x 3, poor memory, right hemiparesis - w/c, cannot make transfers independently, is incontinent bowel and bladder.        Assessment & Plan:

## 2011-01-21 NOTE — Assessment & Plan Note (Signed)
Lab Results  Component Value Date   HGBA1C 8.8* 07/15/2010   Due for follow up lab and adjustment in regimen as indicated

## 2011-01-21 NOTE — Assessment & Plan Note (Signed)
Cough w/o signs or symptoms or findings of bacterial infection.  Plan - supportive care - see AVS

## 2011-01-21 NOTE — Assessment & Plan Note (Signed)
Stable at today's exam w/o signs of decompensation. Has been using nocturnal oxygen  Plan - continue present medications.           Nocturnal oximetry to establish continued need for O2

## 2011-01-24 ENCOUNTER — Other Ambulatory Visit: Payer: Self-pay | Admitting: Internal Medicine

## 2011-02-21 ENCOUNTER — Other Ambulatory Visit: Payer: Self-pay | Admitting: Internal Medicine

## 2011-02-22 NOTE — Telephone Encounter (Signed)
Refill request done. 

## 2011-02-27 ENCOUNTER — Telehealth: Payer: Self-pay | Admitting: Internal Medicine

## 2011-02-28 NOTE — Telephone Encounter (Signed)
Left a message with family for patient's wife to call me.

## 2011-03-01 NOTE — Telephone Encounter (Signed)
Spoke with patient's wife and she states patient has had a stroke and is in a wheelchair. States he is incontinent of stool. He also has heart problems (has ICD). He is overdue for a colonoscopy and Dr. Debby Bud office told her to call. Scheduled patient for OV on 03/17/11 2:45/3:00 PM to determine if patient can have colonoscopy.

## 2011-03-08 ENCOUNTER — Encounter: Payer: Self-pay | Admitting: *Deleted

## 2011-03-10 ENCOUNTER — Encounter: Payer: Self-pay | Admitting: Internal Medicine

## 2011-03-17 ENCOUNTER — Ambulatory Visit: Payer: Medicare Other | Admitting: Internal Medicine

## 2011-03-27 ENCOUNTER — Encounter: Payer: Self-pay | Admitting: Gastroenterology

## 2011-04-10 ENCOUNTER — Encounter: Payer: Self-pay | Admitting: Internal Medicine

## 2011-04-10 ENCOUNTER — Ambulatory Visit (INDEPENDENT_AMBULATORY_CARE_PROVIDER_SITE_OTHER): Payer: Medicare Other | Admitting: Internal Medicine

## 2011-04-10 VITALS — BP 134/58 | HR 83 | Temp 97.4°F | Resp 16

## 2011-04-10 DIAGNOSIS — J209 Acute bronchitis, unspecified: Secondary | ICD-10-CM

## 2011-04-10 MED ORDER — AZITHROMYCIN 250 MG PO TABS: ORAL_TABLET | ORAL | Status: AC

## 2011-04-10 MED ORDER — INSULIN NPH (HUMAN) (ISOPHANE) 100 UNIT/ML ~~LOC~~ SUSP: 20.0000 [IU] | Freq: Every day | SUBCUTANEOUS | Status: DC

## 2011-04-10 MED ORDER — PROMETHAZINE-CODEINE 6.25-10 MG/5ML PO SYRP: 5.0000 mL | ORAL_SOLUTION | ORAL | Status: AC | PRN

## 2011-04-10 NOTE — Progress Notes (Signed)
  Subjective:    Patient ID: [name], male    DOB: 1940/03/11, 71 y.o.   MRN: [id number]  HPI Patient presents with a 1 week h/o cough that is productive of yellow/dark sputum with some blood streaks. He has had sweats and he has felt weak. He has had more SOB. His cough is worse with oxygen so he hasn't usedO2 for two night. No chest pain, no rigors.  PMH, FamHx and SocHx reviewed for any changes and relevance.    Review of Systems System review is negative for any constitutional, cardiac, pulmonary, GI or neuro symptoms or complaints other than as described in the HPI.     Objective:   Physical Exam Filed Vitals:   04/10/11 1555  BP: 134/58  Pulse: 83  Temp: 97.4 F (36.3 C)  Resp: 16   Wt Readings from Last 3 Encounters:  02/21/10 223 lb (101.152 kg)  01/21/10 223 lb (101.152 kg)  06/02/09 223 lb (101.152 kg)   Gen'l - w/c bound AA man in no acute distress with a persistent cough HEENT_ C&S clear Cor - 2+ radial, RRR Pulm - persistent wet cough, rhonchi throughout on chest exam, no increased WOB of breathing. Neuro - Awake, alert to place and context.        Assessment & Plan:  Acute bronchitis - hemodynamically and stable from a pulmonary perspective  Plan - Azithromycin           Prometh/cod

## 2011-04-10 NOTE — Patient Instructions (Signed)
Probably acute bronchitis. Plan azithromycin for 5 days = 10 days of antibiotic coverage; promethazine with codeine cough syrup every 4-6 hours as needed for cough; hydrate; tylenol 500 mg 3 or 4 times a day for fever. Sudafed 30mg  (generic is fine) twice a day for congrtestion.  Diabetes - for cost will change to humulin N taking 20 units every night. Watch blood sugars in the AM and one other time a day on a rotating schedule ( lunch or supper or bedtime) for 7-10 days so we know if he is doing ok.   Acute Bronchitis You have acute bronchitis. This means you have a chest cold. The airways in your lungs are red and sore (inflamed). Acute means it is sudden onset.   CAUSES Bronchitis is most often caused by the same virus that causes a cold. SYMPTOMS    Body aches.   Chest congestion.   Chills.   Cough.   Fever.   Shortness of breath.   Sore throat.  TREATMENT   Acute bronchitis is usually treated with rest, fluids, and medicines for relief of fever or cough. Most symptoms should go away after a few days or a week. Increased fluids may help thin your secretions and will prevent dehydration. Your caregiver may give you an inhaler to improve your symptoms. The inhaler reduces shortness of breath and helps control cough. You can take over-the-counter pain relievers or cough medicine to decrease coughing, pain, or fever. A cool-air vaporizer may help thin bronchial secretions and make it easier to clear your chest. Antibiotics are usually not needed but can be prescribed if you smoke, are seriously ill, have chronic lung problems, are elderly, or you are at higher risk for developing complications. Allergies and asthma can make bronchitis worse. Repeated episodes of bronchitis may cause longstanding lung problems. Avoid smoking and secondhand smoke. Exposure to cigarette smoke or irritating chemicals will make bronchitis worse. If you are a cigarette smoker, consider using nicotine gum or skin  patches to help control withdrawal symptoms. Quitting smoking will help your lungs heal faster. Recovery from bronchitis is often slow, but you should start feeling better after 2 to 3 days. Cough from bronchitis frequently lasts for 3 to 4 weeks. To prevent another bout of acute bronchitis:  Quit smoking.   Wash your hands frequently to get rid of viruses or use a hand sanitizer.   Avoid other people with cold or virus symptoms.   Try not to touch your hands to your mouth, nose, or eyes.  SEEK IMMEDIATE MEDICAL CARE IF:  You develop increased fever, chills, or chest pain.   You have severe shortness of breath or bloody sputum.   You develop dehydration, fainting, repeated vomiting, or a severe headache.   You have no improvement after 1 week of treatment or you get worse.  MAKE SURE YOU:    Understand these instructions.   Will watch your condition.   Will get help right away if you are not doing well or get worse.  Document Released: 03/02/2004 Document Revised: 01/12/2011 Document Reviewed: 05/18/2010 [facility] Patient Information 2012 [place], [place].

## 2011-04-17 ENCOUNTER — Encounter: Payer: Self-pay | Admitting: *Deleted

## 2011-04-27 ENCOUNTER — Encounter: Payer: Self-pay | Admitting: Internal Medicine

## 2011-04-27 ENCOUNTER — Ambulatory Visit (INDEPENDENT_AMBULATORY_CARE_PROVIDER_SITE_OTHER): Payer: Medicare Other | Admitting: *Deleted

## 2011-04-27 DIAGNOSIS — I509 Heart failure, unspecified: Secondary | ICD-10-CM

## 2011-04-27 LAB — ICD DEVICE OBSERVATION
BRDY-0002RV: 40 {beats}/min
DEV-0020ICD: NEGATIVE
DEVICE MODEL ICD: 119309
RV LEAD IMPEDENCE ICD: 706 Ohm
TZAT-0001FASTVT: 1
TZAT-0001FASTVT: 2
TZAT-0002FASTVT: NEGATIVE
TZAT-0004FASTVT: 8
TZAT-0005FASTVT: 88 pct
TZAT-0013FASTVT: 6
TZAT-0018FASTVT: NEGATIVE
TZON-0005FASTVT: 1
TZST-0001FASTVT: 3
TZST-0001FASTVT: 5
TZST-0001FASTVT: 7
TZST-0003FASTVT: 31 J
TZST-0003FASTVT: 31 J
TZST-0003FASTVT: 31 J
VENTRICULAR PACING ICD: 2 pct

## 2011-04-27 NOTE — Progress Notes (Signed)
ICD check 

## 2011-06-03 ENCOUNTER — Other Ambulatory Visit: Payer: Self-pay | Admitting: Internal Medicine

## 2011-06-27 ENCOUNTER — Other Ambulatory Visit: Payer: Self-pay | Admitting: Internal Medicine

## 2011-06-27 ENCOUNTER — Other Ambulatory Visit: Payer: Self-pay | Admitting: Endocrinology

## 2011-06-28 ENCOUNTER — Other Ambulatory Visit: Payer: Self-pay | Admitting: *Deleted

## 2011-06-28 MED ORDER — SIMVASTATIN 40 MG PO TABS: 40.0000 mg | ORAL_TABLET | Freq: Every day | ORAL | Status: DC

## 2011-07-31 ENCOUNTER — Other Ambulatory Visit: Payer: Self-pay | Admitting: Internal Medicine

## 2011-08-16 ENCOUNTER — Telehealth: Payer: Self-pay | Admitting: Internal Medicine

## 2011-08-16 ENCOUNTER — Encounter (HOSPITAL_COMMUNITY): Payer: Self-pay | Admitting: *Deleted

## 2011-08-16 ENCOUNTER — Emergency Department (HOSPITAL_COMMUNITY)
Admission: EM | Admit: 2011-08-16 | Discharge: 2011-08-17 | Disposition: A | Discharge status: Home / Self Care | Payer: Medicare Other | Attending: Emergency Medicine | Admitting: Emergency Medicine

## 2011-08-16 DIAGNOSIS — R109 Unspecified abdominal pain: Secondary | ICD-10-CM | POA: Insufficient documentation

## 2011-08-16 DIAGNOSIS — K219 Gastro-esophageal reflux disease without esophagitis: Secondary | ICD-10-CM | POA: Insufficient documentation

## 2011-08-16 DIAGNOSIS — I2510 Atherosclerotic heart disease of native coronary artery without angina pectoris: Secondary | ICD-10-CM | POA: Insufficient documentation

## 2011-08-16 DIAGNOSIS — R339 Retention of urine, unspecified: Secondary | ICD-10-CM

## 2011-08-16 DIAGNOSIS — I10 Essential (primary) hypertension: Secondary | ICD-10-CM | POA: Insufficient documentation

## 2011-08-16 DIAGNOSIS — N390 Urinary tract infection, site not specified: Secondary | ICD-10-CM

## 2011-08-16 DIAGNOSIS — Z95810 Presence of automatic (implantable) cardiac defibrillator: Secondary | ICD-10-CM | POA: Insufficient documentation

## 2011-08-16 DIAGNOSIS — E109 Type 1 diabetes mellitus without complications: Secondary | ICD-10-CM | POA: Insufficient documentation

## 2011-08-16 DIAGNOSIS — Z794 Long term (current) use of insulin: Secondary | ICD-10-CM | POA: Insufficient documentation

## 2011-08-16 DIAGNOSIS — Z79899 Other long term (current) drug therapy: Secondary | ICD-10-CM | POA: Insufficient documentation

## 2011-08-16 DIAGNOSIS — E785 Hyperlipidemia, unspecified: Secondary | ICD-10-CM | POA: Insufficient documentation

## 2011-08-16 DIAGNOSIS — Z8673 Personal history of transient ischemic attack (TIA), and cerebral infarction without residual deficits: Secondary | ICD-10-CM | POA: Insufficient documentation

## 2011-08-16 LAB — COMPREHENSIVE METABOLIC PANEL
AST: 15 U/L (ref 0–37)
Albumin: 3.5 g/dL (ref 3.5–5.2)
Alkaline Phosphatase: 73 U/L (ref 39–117)
BUN: 22 mg/dL (ref 6–23)
Potassium: 3.9 mEq/L (ref 3.5–5.1)
Sodium: 139 mEq/L (ref 135–145)
Total Protein: 7.8 g/dL (ref 6.0–8.3)

## 2011-08-16 LAB — CBC WITH DIFFERENTIAL/PLATELET
Basophils Absolute: 0 10*3/uL (ref 0.0–0.1)
Basophils Relative: 0 % (ref 0–1)
Eosinophils Absolute: 0.2 10*3/uL (ref 0.0–0.7)
MCH: 31.3 pg (ref 26.0–34.0)
MCHC: 32.2 g/dL (ref 30.0–36.0)
Monocytes Relative: 10 % (ref 3–12)
Neutrophils Relative %: 61 % (ref 43–77)
Platelets: 187 10*3/uL (ref 150–400)
RDW: 14 % (ref 11.5–15.5)

## 2011-08-16 LAB — URINALYSIS, ROUTINE W REFLEX MICROSCOPIC
Bilirubin Urine: NEGATIVE
Hgb urine dipstick: NEGATIVE
Ketones, ur: NEGATIVE mg/dL
Specific Gravity, Urine: 1.018 (ref 1.005–1.030)
pH: 5 (ref 5.0–8.0)

## 2011-08-16 NOTE — ED Notes (Signed)
Pt and family member reports pt being incontinent of urine normally, has been having decrease in urine output despite taking lasix and having lower abd pain.

## 2011-08-16 NOTE — Telephone Encounter (Signed)
Pt is having problems urinating.  He is on Flomax but that is not helping.  Also taking lasix.  Pain when he tries to urinate in his lower abdomen.

## 2011-08-16 NOTE — Telephone Encounter (Signed)
Called - family took patient to hospital

## 2011-08-16 NOTE — ED Notes (Signed)
Phlebotomy in with pt at present

## 2011-08-16 NOTE — ED Provider Notes (Signed)
History     CSN: [id number]  Arrival date & time 08/16/11  1629   First MD Initiated Contact with Patient 08/16/11 2018      Chief Complaint  Patient presents with  . Urinary Retention    (Consider location/radiation/quality/duration/timing/severity/associated sxs/prior treatment) The history is provided by the patient and a caregiver.   71 year old male has been having difficulty urinating since yesterday. He seems to be grunting and straining when he tries to urinate and is unable to urinate a small amount. He is normally incontinent of urine. There has been no fever, chills, sweats. He is complaining of some mild pain in the suprapubic area. He has not had problems with urinary retention before.  Past Medical History  Diagnosis Date  . 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 Automatic implantable cardiac defibrillator in situ   . Congestive heart failure, unspecified   . CVA (cerebral vascular accident)   . Retention of urine, unspecified   . Pneumonia, organism unspecified   . Cough   . Acute bronchitis   . Contusion of foot   . Other sign and symptom in breast   . Fibromyalgia   . Hyperlipidemia   . Coronary atherosclerosis of unspecified type of vessel, native or graft   . Esophageal reflux   . Type I (juvenile type) diabetes mellitus without mention of complication, uncontrolled   . Essential hypertension, benign   . Cocaine abuse   . Alcohol abuse   . Microalbuminuria   . GERD (gastroesophageal reflux disease)   . Arteriovenous malformation (AVM)     of colon  . Diverticulosis   . GI bleed     secondary to avm    Past Surgical History  Procedure Date  . Back surgery     X 2  . Tonsillectomy   . Cardiac defibrillator placement     Family History  Problem Relation Age of Onset  . Heart failure Mother   . Diabetes Sister   . Colon cancer Brother     questionable  . Prostate cancer Neg Hx     History  Substance Use Topics  .  Smoking status: Current Everyday Smoker -- 2.0 packs/day for 55 years    Types: Cigarettes  . Smokeless tobacco: Never Used   Comment: down to 6 cigarettes a day  . Alcohol Use: No     quit at the time of stroke      Review of Systems  All other systems reviewed and are negative.    Allergies  Metformin  Home Medications   Current Outpatient Rx  Name Route Sig Dispense Refill  . BENICAR 40 MG PO TABS  TAKE ONE TABLET BY MOUTH EVERY DAY 30 each 4  . BENZONATATE 100 MG PO CAPS Oral Take 1 capsule (100 mg total) by mouth 3 (three) times daily as needed for cough. 30 capsule 1  . CARVEDILOL 25 MG PO TABS  TAKE ONE-HALF TABLET BY MOUTH TWICE DAILY 30 tablet 5  . CLONIDINE HCL 0.2 MG PO TABS  TAKE ONE TABLET BY MOUTH TWICE DAILY 60 tablet 5  . FAMOTIDINE 20 MG PO TABS Oral Take 10 mg by mouth 2 (two) times daily.     Marland Kitchen FINASTERIDE 5 MG PO TABS  TAKE ONE TABLET BY MOUTH EVERY DAY 30 tablet 5  . FUROSEMIDE 40 MG PO TABS  TAKE ONE TABLET BY MOUTH DAILY 30 tablet 5  . HYDRALAZINE HCL 25 MG PO TABS  TAKE  ONE TABLET BY MOUTH THREE TIMES DAILY 90 tablet 1  . INSULIN GLARGINE 100 UNIT/ML [place] SOLN      . INSULIN ISOPHANE HUMAN 100 UNIT/ML [place] SUSP Subcutaneous Inject 25 Units into the skin at bedtime.    Marland Kitchen KLOR-CON M20 20 MEQ PO TBCR  TAKE ONE TABLET BY MOUTH TWICE DAILY 60 each 11  . SERTRALINE HCL 50 MG PO TABS  TAKE ONE TABLET BY MOUTH EVERY DAY 30 tablet 5  . SIMVASTATIN 40 MG PO TABS  TAKE ONE TABLET BY MOUTH IN THE EVENING 30 tablet 11  . SIMVASTATIN 40 MG PO TABS Oral Take 1 tablet (40 mg total) by mouth at bedtime. 30 tablet 11  . TAMSULOSIN HCL 0.4 MG PO CAPS  TAKE ONE CAPSULE BY MOUTH NIGHTLY 90 capsule 3    BP 134/55  Pulse 57  Temp 98.2 F (36.8 C) (Oral)  Resp 18  SpO2 97%  Physical Exam  Nursing note and vitals reviewed.  71 year old male who is resting comfortably and in no acute distress. Vital signs are significant for borderline bradycardia with heart rate of 57.  Oxygen saturation is 97% which is normal. Head is normocephalic and atraumatic. PERRLA, EOMI. Neck is nontender supple. Back is nontender. Lungs are clear without rales, wheezes, or rhonchi. Heart has regular rate rhythm without murmur. Abdomen is soft, flat, with a distended bladder which is distended almost to the umbilicus. There is mild tenderness to palpation over the bladder. There's no CVA tenderness. There is no other organomegaly. Extremities have 2+ edema, no cyanosis. Full range of motion is present. Skin is warm and dry without rash. Neurologic: He is awake, alert, oriented. Cranial nerves are intact. There no motor or sensory deficits.  ED Course  Procedures (including critical care time)  Results for orders placed during the hospital encounter of 08/16/11  URINALYSIS, ROUTINE W REFLEX MICROSCOPIC      Component Value Range   Color, Urine YELLOW  YELLOW   APPearance CLEAR  CLEAR   Specific Gravity, Urine 1.018  1.005 - 1.030   pH 5.0  5.0 - 8.0   Glucose, UA NEGATIVE  NEGATIVE mg/dL   Hgb urine dipstick NEGATIVE  NEGATIVE   Bilirubin Urine NEGATIVE  NEGATIVE   Ketones, ur NEGATIVE  NEGATIVE mg/dL   Protein, ur NEGATIVE  NEGATIVE mg/dL   Urobilinogen, UA 0.2  0.0 - 1.0 mg/dL   Nitrite NEGATIVE  NEGATIVE   Leukocytes, UA NEGATIVE  NEGATIVE  URINE CULTURE      Component Value Range   Specimen Description URINE, CATHETERIZED     Special Requests NONE     Culture  Setup Time 08/16/2011 21:04     Colony Count 10,000 COLONIES/ML     Culture GRAM NEGATIVE RODS     Report Status PENDING    CBC WITH DIFFERENTIAL      Component Value Range   WBC 8.8  4.0 - 10.5 K/uL   RBC 3.36 (*) 4.22 - 5.81 MIL/uL   Hemoglobin 10.5 (*) 13.0 - 17.0 g/dL   HCT 16.1 (*) 09.6 - 04.5 %   MCV 97.0  78.0 - 100.0 fL   MCH 31.3  26.0 - 34.0 pg   MCHC 32.2  30.0 - 36.0 g/dL   RDW 40.9  81.1 - 91.4 %   Platelets 187  150 - 400 K/uL   Neutrophils Relative 61  43 - 77 %   Neutro Abs 5.3  1.7 - 7.7 K/uL    Lymphocytes  Relative 27  12 - 46 %   Lymphs Abs 2.4  0.7 - 4.0 K/uL   Monocytes Relative 10  3 - 12 %   Monocytes Absolute 0.9  0.1 - 1.0 K/uL   Eosinophils Relative 2  0 - 5 %   Eosinophils Absolute 0.2  0.0 - 0.7 K/uL   Basophils Relative 0  0 - 1 %   Basophils Absolute 0.0  0.0 - 0.1 K/uL  COMPREHENSIVE METABOLIC PANEL      Component Value Range   Sodium 139  135 - 145 mEq/L   Potassium 3.9  3.5 - 5.1 mEq/L   Chloride 103  96 - 112 mEq/L   CO2 24  19 - 32 mEq/L   Glucose, Bld 116 (*) 70 - 99 mg/dL   BUN 22  6 - 23 mg/dL   Creatinine, Ser 0.45  0.50 - 1.35 mg/dL   Calcium 9.1  8.4 - 40.9 mg/dL   Total Protein 7.8  6.0 - 8.3 g/dL   Albumin 3.5  3.5 - 5.2 g/dL   AST 15  0 - 37 U/L   ALT 14  0 - 53 U/L   Alkaline Phosphatase 73  39 - 117 U/L   Total Bilirubin 0.3  0.3 - 1.2 mg/dL   GFR calc non Af Amer 66 (*) >90 mL/min   GFR calc Af Amer 76 (*) >90 mL/min  URINALYSIS, ROUTINE W REFLEX MICROSCOPIC      Component Value Range   Color, Urine RED (*) YELLOW   APPearance CLOUDY (*) CLEAR   Specific Gravity, Urine 1.046 (*) 1.005 - 1.030   pH 5.0  5.0 - 8.0   Glucose, UA NEGATIVE  NEGATIVE mg/dL   Hgb urine dipstick LARGE (*) NEGATIVE   Bilirubin Urine NEGATIVE  NEGATIVE   Ketones, ur 15 (*) NEGATIVE mg/dL   Protein, ur 811 (*) NEGATIVE mg/dL   Urobilinogen, UA 0.2  0.0 - 1.0 mg/dL   Nitrite NEGATIVE  NEGATIVE   Leukocytes, UA MODERATE (*) NEGATIVE  URINE MICROSCOPIC-ADD ON      Component Value Range   Squamous Epithelial / LPF RARE  RARE   WBC, UA 3-6  <3 WBC/hpf   RBC / HPF TOO NUMEROUS TO COUNT  <3 RBC/hpf   Bacteria, UA RARE  RARE   Ct Abdomen Pelvis W Contrast  08/17/2011  *RADIOLOGY REPORT*  Clinical Data: Urinary incontinence; decreased urine output.  Lower abdominal pain.  CT ABDOMEN AND PELVIS WITH CONTRAST  Technique:  Multidetector CT imaging of the abdomen and pelvis was performed following the standard protocol during bolus administration of intravenous  contrast.  Contrast: OMNIPAQUE IOHEXOL 300 MG/ML  SOLN  Comparison: CT urogram performed 08/19/2008  Findings: Mild bibasilar atelectasis is noted.  An AICD lead is partially imaged.  The liver and spleen are unremarkable in appearance.  The gallbladder is within normal limits.  The pancreas and adrenal glands are unremarkable.  A 5 mm nonobstructing stone is noted at the lower pole of the right kidney, and a likely 2 mm nonobstructing stone is noted at the lower pole of the left kidney.  Minimal nonspecific perinephric stranding is noted bilaterally.  The kidneys are otherwise grossly unremarkable in appearance.  There is no evidence of hydronephrosis; no obstructing ureteral stones are seen.  No free fluid is identified.  The small bowel is unremarkable in appearance.  The stomach is within normal limits.  No acute vascular abnormalities are seen.  Diffuse calcification is noted  along the abdominal aorta and its branches.  The appendix is normal in caliber and contains contrast, without evidence for appendicitis.  Minimal diverticulosis is noted along the descending colon.  Contrast progresses to the level of the rectum.  The colon is otherwise unremarkable in appearance.  The bladder is decompressed, with a Foley catheter in place. Bladder wall thickening may reflect chronic inflammation, given the lack of findings on urinalysis to suggest cystitis.  The prostate is enlarged, measuring 5.4 cm in transverse dimension, with a heterogeneous appearance and scattered calcification.  No inguinal lymphadenopathy is seen.  No acute osseous abnormalities are identified.  Chronic degenerative change is noted at the L4-L5 intervertebral disc space, with irregularity of the associated endplates.  Underlying facet disease is noted along the lumbar spine.  Anterolateral bridging osteophytes are seen along the lower thoracic spine. Vacuum phenomenon is noted at L5-S1.  IMPRESSION:  1.  Bladder decompressed, with a Foley  catheter in place.  Bladder wall thickening may reflect chronic inflammation, given the lack of findings on urinalysis to suggest cystitis. 2.  Enlarged prostate, with a heterogeneous appearance; this appears grossly stable in size from 2010, though given its appearance, malignancy cannot be entirely excluded. 3.  Nonobstructing stones noted at the lower poles of both kidneys, measuring up to 5 mm in size on the right. 4.  Mild bibasilar atelectasis noted. 5.  Diffuse calcification along the abdominal aorta and its branches. 6.  Minimal diverticulosis noted along the descending colon, without evidence of diverticulitis. 7.  Chronic degenerative change at the lower lumbar spine appears grossly stable from 2010.  Original Report Authenticated By: Tonia Ghent, M.D.       1. UTI (lower urinary tract infection)   2. Urinary retention       MDM  Urinary retention. Foley catheter will be placed to determine how much urine is in his bladder and urinalysis sent to evaluate for possible infection.  Foley catheter only yielded about 200 mL of urine. He continues to have lower abdominal pain and he will be sent for CT scan.      Dione Booze, MD 08/17/11 1459

## 2011-08-17 ENCOUNTER — Encounter (HOSPITAL_COMMUNITY): Payer: Self-pay | Admitting: Radiology

## 2011-08-17 ENCOUNTER — Emergency Department (HOSPITAL_COMMUNITY): Payer: Medicare Other

## 2011-08-17 LAB — URINALYSIS, ROUTINE W REFLEX MICROSCOPIC
Glucose, UA: NEGATIVE mg/dL
Ketones, ur: 15 mg/dL — AB
pH: 5 (ref 5.0–8.0)

## 2011-08-17 LAB — URINE MICROSCOPIC-ADD ON

## 2011-08-17 MED ORDER — CIPROFLOXACIN HCL 500 MG PO TABS
500.0000 mg | ORAL_TABLET | Freq: Once | ORAL | Status: AC
  Administered 2011-08-17: 500 mg via ORAL
  Filled 2011-08-17: qty 1

## 2011-08-17 MED ORDER — IOHEXOL 300 MG/ML  SOLN: 20.0000 mL | INTRAMUSCULAR | Status: AC

## 2011-08-17 MED ORDER — IOHEXOL 300 MG/ML  SOLN
100.0000 mL | Freq: Once | INTRAMUSCULAR | Status: AC | PRN
  Administered 2011-08-17: 100 mL via INTRAVENOUS

## 2011-08-17 MED ORDER — CIPROFLOXACIN HCL 500 MG PO TABS: 500.0000 mg | ORAL_TABLET | Freq: Two times a day (BID) | ORAL | Status: AC

## 2011-08-17 NOTE — ED Notes (Signed)
Contrast complete

## 2011-08-17 NOTE — ED Provider Notes (Signed)
Patient had a CT scan that showed inflammation of the bladder but no evidence of other acute pathology. Nurse informed me that the patient's urine was bloody and was tea colored when the catheter was placed. This is not consistent with a completely normal urine. A repeat UA was sent and showed large leukocytes and blood.  Will treat for UTI and culture was sent.  Catheter left in place and to f/u with PCP in 2 days for removal.  Gwyneth Sprout, MD 08/17/11 [id number]

## 2011-08-17 NOTE — ED Notes (Signed)
Leg bag applied to catheter. Instructions and teaching provided.

## 2011-08-17 NOTE — ED Notes (Signed)
Pt ambulated with a steady gait;VSS; A&Ox3; no signs of distress; respirations even and unlabored; skin warm and dry; no questions at this time.  

## 2011-08-17 NOTE — ED Notes (Signed)
Patient transported to CT 

## 2011-08-17 NOTE — ED Notes (Signed)
MD made aware of changes in urinary color.

## 2011-08-18 ENCOUNTER — Ambulatory Visit (INDEPENDENT_AMBULATORY_CARE_PROVIDER_SITE_OTHER): Payer: Medicare Other | Admitting: Internal Medicine

## 2011-08-18 ENCOUNTER — Encounter: Payer: Self-pay | Admitting: Internal Medicine

## 2011-08-18 ENCOUNTER — Other Ambulatory Visit (INDEPENDENT_AMBULATORY_CARE_PROVIDER_SITE_OTHER): Payer: Medicare Other

## 2011-08-18 ENCOUNTER — Other Ambulatory Visit: Payer: Self-pay | Admitting: Internal Medicine

## 2011-08-18 VITALS — BP 136/54 | HR 66 | Temp 97.4°F | Resp 16

## 2011-08-18 DIAGNOSIS — I10 Essential (primary) hypertension: Secondary | ICD-10-CM

## 2011-08-18 DIAGNOSIS — R339 Retention of urine, unspecified: Secondary | ICD-10-CM

## 2011-08-18 DIAGNOSIS — R338 Other retention of urine: Secondary | ICD-10-CM

## 2011-08-18 DIAGNOSIS — N390 Urinary tract infection, site not specified: Secondary | ICD-10-CM

## 2011-08-18 DIAGNOSIS — N401 Enlarged prostate with lower urinary tract symptoms: Secondary | ICD-10-CM | POA: Insufficient documentation

## 2011-08-18 LAB — URINALYSIS, ROUTINE W REFLEX MICROSCOPIC
Bilirubin Urine: NEGATIVE
Ketones, ur: NEGATIVE
Urobilinogen, UA: 0.2 (ref 0.0–1.0)

## 2011-08-18 NOTE — Patient Instructions (Signed)
Benign Prostatic Hyperplasia You have an enlarged prostate. This is common in elderly males. It is called BPH. This stands for benign prostate hyperplasia. The prostate gland is located in base of the bladder. When it grows, the prostate blocks the urethra. This is the tube which drains urine from the bladder.  SYMPTOMS  Weak urine stream.   Dribbling.   Feeling like the bladder has not emptied completely.   Difficulty starting urination.   Getting up frequently at night to urinate.   Urinating more frequently during the day.  Complete urinary blockage or severe pain with urination requires immediate attention. DIAGNOSIS   Your caregiver often has a good idea what is wrong by taking a history and doing a physical exam.   Special x-rays may be done.  TREATMENT   For mild problems, no treatment may be necessary.   If the problems are moderate, medications may provide relief. Some of these work by making the prostate gland smaller. The herb saw palmetto is commonly used.   If complete blockage occurs, a Foley catheter is usually left in place for a few days.   Surgery is often needed for more severe problems. TURP is the prostate surgery for BPH which is done through the urethra. TURP stands for transurethral resection of the prostate. It involves cutting away chips from the prostate. It is done by removing chips so that they can come out through the penis.   Techniques using heat, microwave and laser to remove the prostate blockage are also being used.  HOME CARE INSTRUCTIONS   Give yourself time when you urinate.   Stay away from alcohol.   Beverages containing caffeine such as coffee, tea and colas can make the problems worse.   Decongestants, antihistamines, and some prescription medicines can also make the problem worse.   Follow up with your caregiver for further treatment as recommended.  SEEK IMMEDIATE MEDICAL CARE IF:   You develop increased pain with urination or  are unable to pass your water.   You develop severe abdominal pain, vomiting, a high fever, or fainting.   You develop back pain or blood in your urine.  MAKE SURE YOU:   Understand these instructions.   Will watch your condition.   Will get help right away if you are not doing well or get worse.  Document Released: 01/23/2005 Document Revised: 01/12/2011 Document Reviewed: 09/28/2006 [employer] Patient Information 2012 [employer]. 

## 2011-08-19 LAB — URINE CULTURE: Colony Count: 10000

## 2011-08-20 ENCOUNTER — Encounter: Payer: Self-pay | Admitting: Internal Medicine

## 2011-08-20 NOTE — Progress Notes (Signed)
  Subjective:    Patient ID: [name], male    DOB: August 25, 1940, 71 y.o.   MRN: [id number]  Benign Prostatic Hypertrophy This is a chronic problem. The current episode started more than 1 year ago. The problem has been rapidly worsening since onset. Irritative symptoms include nocturia. Irritative symptoms do not include frequency. Obstructive symptoms include a slower stream, straining and a weak stream. Obstructive symptoms do not include dribbling, incomplete emptying or an intermittent stream. Associated symptoms include dysuria. Pertinent negatives include no chills, genital pain, hematuria, hesitancy, nausea or vomiting. AUA score is 20-35. His sexual activity is non-contributory to the current illness. Nothing aggravates the symptoms. Past treatments include finasteride and tamsulosin (foley cath was placed 2 days ago in the ER). The treatment provided mild relief. He has been using treatment for 2 or more years.      Review of Systems  Constitutional: Negative.  Negative for chills.  HENT: Negative.   Eyes: Negative.   Respiratory: Negative.   Cardiovascular: Negative.   Gastrointestinal: Negative.  Negative for nausea and vomiting.  Genitourinary: Positive for dysuria and nocturia. Negative for hesitancy, frequency, hematuria and incomplete emptying.  Musculoskeletal: Negative.   Skin: Negative.   Neurological: Negative.   Hematological: Negative.   Psychiatric/Behavioral: Negative.        Objective:   Physical Exam  Vitals reviewed. Constitutional: Vital signs are normal.  Non-toxic appearance. He has a sickly appearance. He appears ill (wheelchair bound). No distress.  HENT:  Head: Normocephalic and atraumatic.  Mouth/Throat: Oropharynx is clear and moist. No oropharyngeal exudate.  Eyes: Conjunctivae are normal. Right eye exhibits no discharge. Left eye exhibits no discharge. No scleral icterus.  Neck: Normal range of motion. Neck supple. No JVD present. No tracheal  deviation present. No thyromegaly present.  Cardiovascular: Normal rate, regular rhythm, normal heart sounds and intact distal pulses.  Exam reveals no gallop and no friction rub.   No murmur heard. Pulmonary/Chest: Effort normal and breath sounds normal. No stridor. No respiratory distress. He has no wheezes. He has no rales. He exhibits no tenderness.  Abdominal: Soft. Bowel sounds are normal. He exhibits no distension and no mass. There is no tenderness. There is no rebound and no guarding.  Genitourinary: Penis normal. Right testis shows no mass, no swelling and no tenderness. Right testis is descended. Left testis shows no mass, no swelling and no tenderness. Left testis is descended. Uncircumcised. No penile tenderness. No discharge found.       Foley cath was easily removed at his request, no bleeding or complications were noted, straw-colored urine was sent to the lab.  Lymphadenopathy:    He has no cervical adenopathy.      Lab Results  Component Value Date   WBC 8.8 08/16/2011   HGB 10.5* 08/16/2011   HCT 32.6* 08/16/2011   PLT 187 08/16/2011   GLUCOSE 116* 08/16/2011   CHOL 123 07/15/2010   TRIG 154.0* 07/15/2010   HDL 33.20* 07/15/2010   LDLCALC 59 07/15/2010   ALT 14 08/16/2011   AST 15 08/16/2011   NA 139 08/16/2011   K 3.9 08/16/2011   CL 103 08/16/2011   CREATININE 1.10 08/16/2011   BUN 22 08/16/2011   CO2 24 08/16/2011   TSH 3.15 07/15/2010   PSA 5.22* 04/14/2008   INR 1.13 07/06/2009   HGBA1C 8.8* 07/15/2010   MICROALBUR 35.6* 02/12/2009      Assessment & Plan:

## 2011-08-20 NOTE — Assessment & Plan Note (Signed)
He has not seen his urologist in years so I offered him a new referral to urology, he insisted that the catheter be removed despite the chance that he could have urinary retention again, I will check his Ur clx and he will stay on cipro for now

## 2011-08-20 NOTE — Assessment & Plan Note (Signed)
His BP is well controlled 

## 2011-08-20 NOTE — Assessment & Plan Note (Signed)
Urine was sent for UA and clx

## 2011-08-21 ENCOUNTER — Telehealth: Payer: Self-pay

## 2011-08-21 NOTE — Telephone Encounter (Signed)
Mildly positive u/a for leukocyte esterase, lots of blood.  Plan septra DS bid x 7 days

## 2011-08-21 NOTE — Telephone Encounter (Signed)
Elease Hashimoto notified and cipro has been started

## 2011-08-21 NOTE — Telephone Encounter (Signed)
Pt's spouse called requesting the results of recent UA, please advise.

## 2011-08-22 LAB — CULTURE, URINE COMPREHENSIVE: Organism ID, Bacteria: NO GROWTH

## 2011-08-26 ENCOUNTER — Other Ambulatory Visit: Payer: Self-pay | Admitting: Internal Medicine

## 2011-09-01 ENCOUNTER — Other Ambulatory Visit: Payer: Self-pay | Admitting: Internal Medicine

## 2011-09-01 ENCOUNTER — Other Ambulatory Visit: Payer: Self-pay

## 2011-09-01 NOTE — Telephone Encounter (Signed)
A user error has taken place: encounter opened in error, closed for administrative reasons.

## 2011-10-31 ENCOUNTER — Other Ambulatory Visit: Payer: Self-pay | Admitting: Internal Medicine

## 2011-11-01 ENCOUNTER — Other Ambulatory Visit: Payer: Self-pay | Admitting: General Practice

## 2011-11-01 MED ORDER — CARVEDILOL 25 MG PO TABS: ORAL_TABLET | ORAL | Status: DC

## 2011-11-29 ENCOUNTER — Other Ambulatory Visit: Payer: Self-pay | Admitting: Internal Medicine

## 2011-11-29 ENCOUNTER — Telehealth: Payer: Self-pay | Admitting: Internal Medicine

## 2011-11-29 NOTE — Telephone Encounter (Signed)
New problem:  Stating someone called them today . Returning call back

## 2011-11-30 ENCOUNTER — Other Ambulatory Visit: Payer: Self-pay | Admitting: General Practice

## 2011-11-30 MED ORDER — CLONIDINE HCL 0.2 MG PO TABS: 0.2000 mg | ORAL_TABLET | Freq: Two times a day (BID) | ORAL | Status: DC

## 2011-11-30 MED ORDER — HYDRALAZINE HCL 25 MG PO TABS: 25.0000 mg | ORAL_TABLET | Freq: Three times a day (TID) | ORAL | Status: DC

## 2011-12-06 ENCOUNTER — Encounter: Payer: Self-pay | Admitting: Internal Medicine

## 2011-12-06 ENCOUNTER — Ambulatory Visit (INDEPENDENT_AMBULATORY_CARE_PROVIDER_SITE_OTHER): Payer: Medicare Other | Admitting: Internal Medicine

## 2011-12-06 ENCOUNTER — Encounter: Payer: Self-pay | Admitting: *Deleted

## 2011-12-06 VITALS — BP 122/58 | HR 54 | Ht 68.0 in

## 2011-12-06 DIAGNOSIS — Z95810 Presence of automatic (implantable) cardiac defibrillator: Secondary | ICD-10-CM

## 2011-12-06 DIAGNOSIS — I509 Heart failure, unspecified: Secondary | ICD-10-CM

## 2011-12-06 DIAGNOSIS — I5022 Chronic systolic (congestive) heart failure: Secondary | ICD-10-CM

## 2011-12-06 DIAGNOSIS — I2510 Atherosclerotic heart disease of native coronary artery without angina pectoris: Secondary | ICD-10-CM

## 2011-12-06 LAB — ICD DEVICE OBSERVATION
BATTERY VOLTAGE: 2.57 V
HV IMPEDENCE: 43 Ohm
RV LEAD AMPLITUDE: 18.3 mv
RV LEAD IMPEDENCE ICD: 717 Ohm
RV LEAD THRESHOLD: 0.6 V
TZAT-0001FASTVT: 1
TZAT-0012FASTVT: 200 ms
TZAT-0018FASTVT: NEGATIVE
TZAT-0019FASTVT: 7.5 V
TZAT-0020FASTVT: 1 ms
TZON-0003FASTVT: 333 ms
TZON-0004FASTVT: 2.5
TZST-0001FASTVT: 4
TZST-0001FASTVT: 6
TZST-0001FASTVT: 7
TZST-0003FASTVT: 26 J
TZST-0003FASTVT: 31 J
TZST-0003FASTVT: 31 J

## 2011-12-06 NOTE — Assessment & Plan Note (Signed)
He denies anginal symptoms. He will continue his current medical therapy. 

## 2011-12-06 NOTE — Assessment & Plan Note (Signed)
His symptoms are well-controlled. He'll continue his current medical therapy, and maintain a low-sodium diet.

## 2011-12-06 NOTE — Assessment & Plan Note (Signed)
His device has reached elective replacement. I've discussed the risk, goals, benefits, and expectations of ICD generator removal, and insertion of a new device, and he wishes to proceed.

## 2011-12-06 NOTE — Progress Notes (Signed)
HPI [name] returns today for followup. He has an ischemic cardiomyopathy and chronic systolic heart failure, and is status post stroke with right hemiparesis. In the interim, the patient has been stable. He denies chest pain or shortness of breath. Minimal peripheral edema is present. No cough or hemoptysis. No ICD shock. He has reached elective replacement. Allergies  Allergen Reactions  . Metformin Other (See Comments)    REACTION: diarrhea     Current Outpatient Prescriptions  Medication Sig Dispense Refill  . carvedilol (COREG) 25 MG tablet TAKE ONE-HALF TABLET BY MOUTH TWICE DAILY  30 tablet  4  . cloNIDine (CATAPRES) 0.2 MG tablet TAKE ONE TABLET BY MOUTH TWICE DAILY  60 tablet  4  . finasteride (PROSCAR) 5 MG tablet Take 5 mg by mouth daily.       . furosemide (LASIX) 40 MG tablet TAKE ONE TABLET BY MOUTH EVERY DAY  30 tablet  6  . hydrALAZINE (APRESOLINE) 25 MG tablet TAKE ONE TABLET BY MOUTH THREE TIMES DAILY  90 tablet  0  . insulin NPH (HUMULIN N,NOVOLIN N) 100 UNIT/ML injection Inject 30 Units into the skin every morning.       . olmesartan (BENICAR) 40 MG tablet Take 40 mg by mouth daily.       . OVER THE COUNTER MEDICATION Take 1 tablet by mouth 2 (two) times a week. Vegetable Laxative OTC      . potassium chloride SA (KLOR-CON M20) 20 MEQ tablet       . sertraline (ZOLOFT) 50 MG tablet TAKE ONE TABLET BY MOUTH EVERY DAY  30 tablet  0  . simvastatin (ZOCOR) 40 MG tablet Take 40 mg by mouth at bedtime.       . Tamsulosin HCl (FLOMAX) 0.4 MG CAPS Take 0.4 mg by mouth at bedtime.       . DISCONTD: carvedilol (COREG) 25 MG tablet Take 1(12.5 mg tablet) 2 times daily with meals.  60 tablet  1  . DISCONTD: cloNIDine (CATAPRES) 0.2 MG tablet Take 1 tablet (0.2 mg total) by mouth 2 (two) times daily.  60 tablet  1  . DISCONTD: furosemide (LASIX) 40 MG tablet Take 40 mg by mouth daily.       . DISCONTD: hydrALAZINE (APRESOLINE) 25 MG tablet Take 1 tablet (25 mg total) by mouth 3  (three) times daily.  90 tablet  1  . DISCONTD: sertraline (ZOLOFT) 50 MG tablet Take 50 mg by mouth daily.       . DISCONTD: sertraline (ZOLOFT) 50 MG tablet TAKE ONE TABLET BY MOUTH EVERY DAY  30 tablet  6     Past Medical History  Diagnosis Date  . 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 Automatic implantable cardiac defibrillator in situ   . Congestive heart failure, unspecified   . CVA (cerebral vascular accident)   . Retention of urine, unspecified   . Pneumonia, organism unspecified   . Cough   . Acute bronchitis   . Contusion of foot   . Other sign and symptom in breast   . Fibromyalgia   . Hyperlipidemia   . Coronary atherosclerosis of unspecified type of vessel, native or graft   . Esophageal reflux   . Type I (juvenile type) diabetes mellitus without mention of complication, uncontrolled   . Essential hypertension, benign   . Cocaine abuse   . Alcohol abuse   . Microalbuminuria   . GERD (gastroesophageal reflux disease)   . Arteriovenous malformation (  AVM)     of colon  . Diverticulosis   . GI bleed     secondary to avm    ROS:   All systems reviewed and negative except as noted in the HPI.   Past Surgical History  Procedure Date  . Back surgery     X 2  . Tonsillectomy   . Cardiac defibrillator placement      Family History  Problem Relation Age of Onset  . Heart failure Mother   . Diabetes Sister   . Colon cancer Brother     questionable  . Prostate cancer Neg Hx      History   Social History  . Marital Status: Married    Spouse Name: N/A    Number of Children: N/A  . Years of Education: N/A   Occupational History  . 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     Social History Main Topics  . Smoking status: Current Every Day Smoker -- 2.0 packs/day for 55 years    Types: Cigarettes  . Smokeless tobacco: Never Used   Comment: down to 6 cigarettes a day  . Alcohol Use:  No     quit at the time of stroke  . Drug Use: No     quit at the time of his stroke  . Sexually Active: Not Currently   Other Topics Concern  . Not on file   Social History Narrative   7th grade education. Married '67. 3 sons - '67, 72, '74; 1 daughter '69. Worked for [employer]. Retired on disability due to CVA with right hemiparesis '10     BP 122/58  Pulse 54  Ht 5' 8" (1.727 m)  SpO2 99%  Physical Exam: Chronically ill appearing NAD HEENT: Unremarkable with no facial droop Neck:  7 cm JVD, no thyromegally Lungs:  Clear HEART:  Regular rate rhythm, no murmurs, no rubs, no clicks Abd:  soft, positive bowel sounds, no organomegally, no rebound, no guarding Ext:  2 plus pulses, no edema, no cyanosis, no clubbing Skin:  No rashes no nodules Neuro:  CN II through XII intact, motor grossly intact  DEVICE  Normal device function.  See PaceArt for details.   Assess/Plan:  

## 2011-12-11 ENCOUNTER — Encounter: Payer: Self-pay | Admitting: Internal Medicine

## 2011-12-27 ENCOUNTER — Other Ambulatory Visit (INDEPENDENT_AMBULATORY_CARE_PROVIDER_SITE_OTHER): Payer: Medicare Other

## 2011-12-27 DIAGNOSIS — I509 Heart failure, unspecified: Secondary | ICD-10-CM

## 2011-12-27 LAB — BASIC METABOLIC PANEL
CO2: 30 mEq/L (ref 19–32)
Chloride: 103 mEq/L (ref 96–112)
Creatinine, Ser: 1 mg/dL (ref 0.4–1.5)
Potassium: 4.2 mEq/L (ref 3.5–5.1)
Sodium: 139 mEq/L (ref 135–145)

## 2011-12-27 LAB — CBC WITH DIFFERENTIAL/PLATELET
Basophils Absolute: 0.1 10*3/uL (ref 0.0–0.1)
Eosinophils Absolute: 0.2 10*3/uL (ref 0.0–0.7)
Lymphocytes Relative: 26.8 % (ref 12.0–46.0)
Lymphs Abs: 1.9 10*3/uL (ref 0.7–4.0)
MCHC: 32.7 g/dL (ref 30.0–36.0)
Monocytes Relative: 6.4 % (ref 3.0–12.0)
Neutro Abs: 4.5 10*3/uL (ref 1.4–7.7)
Platelets: 212 10*3/uL (ref 150.0–400.0)
RDW: 14.5 % (ref 11.5–14.6)

## 2011-12-27 LAB — APTT: aPTT: 27.1 s (ref 21.7–28.8)

## 2011-12-27 LAB — PROTIME-INR: Prothrombin Time: 11.9 s (ref 10.2–12.4)

## 2011-12-31 MED ORDER — CEFAZOLIN SODIUM-DEXTROSE 2-3 GM-% IV SOLR
2.0000 g | INTRAVENOUS | Status: AC
  Filled 2011-12-31: qty 50

## 2011-12-31 MED ORDER — SODIUM CHLORIDE 0.9 % IR SOLN
80.0000 mg | Status: AC
  Filled 2011-12-31: qty 2

## 2012-01-01 ENCOUNTER — Ambulatory Visit (HOSPITAL_COMMUNITY)
Admission: RE | Admit: 2012-01-01 | Discharge: 2012-01-01 | Disposition: A | Discharge status: Home / Self Care | Payer: Medicare Other | Source: Ambulatory Visit | Attending: Internal Medicine | Admitting: Internal Medicine

## 2012-01-01 ENCOUNTER — Encounter (HOSPITAL_COMMUNITY)
Admission: RE | Disposition: A | Discharge status: Home / Self Care | Payer: Self-pay | Source: Ambulatory Visit | Attending: Internal Medicine

## 2012-01-01 DIAGNOSIS — Z794 Long term (current) use of insulin: Secondary | ICD-10-CM | POA: Insufficient documentation

## 2012-01-01 DIAGNOSIS — I2589 Other forms of chronic ischemic heart disease: Secondary | ICD-10-CM | POA: Insufficient documentation

## 2012-01-01 DIAGNOSIS — K219 Gastro-esophageal reflux disease without esophagitis: Secondary | ICD-10-CM | POA: Insufficient documentation

## 2012-01-01 DIAGNOSIS — I2510 Atherosclerotic heart disease of native coronary artery without angina pectoris: Secondary | ICD-10-CM | POA: Insufficient documentation

## 2012-01-01 DIAGNOSIS — Z79899 Other long term (current) drug therapy: Secondary | ICD-10-CM | POA: Insufficient documentation

## 2012-01-01 DIAGNOSIS — I10 Essential (primary) hypertension: Secondary | ICD-10-CM | POA: Insufficient documentation

## 2012-01-01 DIAGNOSIS — K5730 Diverticulosis of large intestine without perforation or abscess without bleeding: Secondary | ICD-10-CM | POA: Insufficient documentation

## 2012-01-01 DIAGNOSIS — I69959 Hemiplegia and hemiparesis following unspecified cerebrovascular disease affecting unspecified side: Secondary | ICD-10-CM | POA: Insufficient documentation

## 2012-01-01 DIAGNOSIS — K5520 Angiodysplasia of colon without hemorrhage: Secondary | ICD-10-CM | POA: Insufficient documentation

## 2012-01-01 DIAGNOSIS — Z01810 Encounter for preprocedural cardiovascular examination: Secondary | ICD-10-CM | POA: Insufficient documentation

## 2012-01-01 DIAGNOSIS — I5022 Chronic systolic (congestive) heart failure: Secondary | ICD-10-CM | POA: Insufficient documentation

## 2012-01-01 DIAGNOSIS — Z4502 Encounter for adjustment and management of automatic implantable cardiac defibrillator: Secondary | ICD-10-CM | POA: Insufficient documentation

## 2012-01-01 DIAGNOSIS — F17200 Nicotine dependence, unspecified, uncomplicated: Secondary | ICD-10-CM | POA: Insufficient documentation

## 2012-01-01 DIAGNOSIS — I472 Ventricular tachycardia: Secondary | ICD-10-CM

## 2012-01-01 DIAGNOSIS — I509 Heart failure, unspecified: Secondary | ICD-10-CM | POA: Insufficient documentation

## 2012-01-01 DIAGNOSIS — IMO0001 Reserved for inherently not codable concepts without codable children: Secondary | ICD-10-CM | POA: Insufficient documentation

## 2012-01-01 HISTORY — PX: IMPLANTABLE CARDIOVERTER DEFIBRILLATOR (ICD) GENERATOR CHANGE: SHX5469

## 2012-01-01 LAB — GLUCOSE, CAPILLARY

## 2012-01-01 LAB — SURGICAL PCR SCREEN: MRSA, PCR: NEGATIVE

## 2012-01-01 SURGERY — ICD GENERATOR CHANGE
Anesthesia: LOCAL

## 2012-01-01 MED ORDER — FENTANYL CITRATE 0.05 MG/ML IJ SOLN
INTRAMUSCULAR | Status: AC
  Filled 2012-01-01: qty 2

## 2012-01-01 MED ORDER — MUPIROCIN 2 % EX OINT
TOPICAL_OINTMENT | Freq: Two times a day (BID) | CUTANEOUS | Status: DC
  Administered 2012-01-01: 1 via NASAL
  Filled 2012-01-01: qty 22

## 2012-01-01 MED ORDER — MIDAZOLAM HCL 5 MG/5ML IJ SOLN
INTRAMUSCULAR | Status: AC
  Filled 2012-01-01: qty 5

## 2012-01-01 MED ORDER — MUPIROCIN 2 % EX OINT
TOPICAL_OINTMENT | CUTANEOUS | Status: AC
  Filled 2012-01-01: qty 22

## 2012-01-01 MED ORDER — CEFAZOLIN SODIUM-DEXTROSE 2-3 GM-% IV SOLR
INTRAVENOUS | Status: AC
  Filled 2012-01-01: qty 50

## 2012-01-01 MED ORDER — CHLORHEXIDINE GLUCONATE 4 % EX LIQD
60.0000 mL | Freq: Once | CUTANEOUS | Status: DC
  Filled 2012-01-01: qty 60

## 2012-01-01 MED ORDER — SODIUM CHLORIDE 0.45 % IV SOLN
INTRAVENOUS | Status: DC
  Administered 2012-01-01: 10:00:00 via INTRAVENOUS

## 2012-01-01 NOTE — H&P (View-Only) (Signed)
HPI [name] returns today for followup. He has an ischemic cardiomyopathy and chronic systolic heart failure, and is status post stroke with right hemiparesis. In the interim, the patient has been stable. He denies chest pain or shortness of breath. Minimal peripheral edema is present. No cough or hemoptysis. No ICD shock. He has reached elective replacement. Allergies  Allergen Reactions  . Metformin Other (See Comments)    REACTION: diarrhea     Current Outpatient Prescriptions  Medication Sig Dispense Refill  . carvedilol (COREG) 25 MG tablet TAKE ONE-HALF TABLET BY MOUTH TWICE DAILY  30 tablet  4  . cloNIDine (CATAPRES) 0.2 MG tablet TAKE ONE TABLET BY MOUTH TWICE DAILY  60 tablet  4  . finasteride (PROSCAR) 5 MG tablet Take 5 mg by mouth daily.       . furosemide (LASIX) 40 MG tablet TAKE ONE TABLET BY MOUTH EVERY DAY  30 tablet  6  . hydrALAZINE (APRESOLINE) 25 MG tablet TAKE ONE TABLET BY MOUTH THREE TIMES DAILY  90 tablet  0  . insulin NPH (HUMULIN N,NOVOLIN N) 100 UNIT/ML injection Inject 30 Units into the skin every morning.       . olmesartan (BENICAR) 40 MG tablet Take 40 mg by mouth daily.       Marland Kitchen OVER THE COUNTER MEDICATION Take 1 tablet by mouth 2 (two) times a week. Vegetable Laxative OTC      . potassium chloride SA (KLOR-CON M20) 20 MEQ tablet       . sertraline (ZOLOFT) 50 MG tablet TAKE ONE TABLET BY MOUTH EVERY DAY  30 tablet  0  . simvastatin (ZOCOR) 40 MG tablet Take 40 mg by mouth at bedtime.       . Tamsulosin HCl (FLOMAX) 0.4 MG CAPS Take 0.4 mg by mouth at bedtime.       Marland Kitchen DISCONTD: carvedilol (COREG) 25 MG tablet Take 1(12.5 mg tablet) 2 times daily with meals.  60 tablet  1  . DISCONTD: cloNIDine (CATAPRES) 0.2 MG tablet Take 1 tablet (0.2 mg total) by mouth 2 (two) times daily.  60 tablet  1  . DISCONTD: furosemide (LASIX) 40 MG tablet Take 40 mg by mouth daily.       Marland Kitchen DISCONTD: hydrALAZINE (APRESOLINE) 25 MG tablet Take 1 tablet (25 mg total) by mouth 3  (three) times daily.  90 tablet  1  . DISCONTD: sertraline (ZOLOFT) 50 MG tablet Take 50 mg by mouth daily.       Marland Kitchen DISCONTD: sertraline (ZOLOFT) 50 MG tablet TAKE ONE TABLET BY MOUTH EVERY DAY  30 tablet  6     Past Medical History  Diagnosis Date  . 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 Automatic implantable cardiac defibrillator in situ   . Congestive heart failure, unspecified   . CVA (cerebral vascular accident)   . Retention of urine, unspecified   . Pneumonia, organism unspecified   . Cough   . Acute bronchitis   . Contusion of foot   . Other sign and symptom in breast   . Fibromyalgia   . Hyperlipidemia   . Coronary atherosclerosis of unspecified type of vessel, native or graft   . Esophageal reflux   . Type I (juvenile type) diabetes mellitus without mention of complication, uncontrolled   . Essential hypertension, benign   . Cocaine abuse   . Alcohol abuse   . Microalbuminuria   . GERD (gastroesophageal reflux disease)   . Arteriovenous malformation (  AVM)     of colon  . Diverticulosis   . GI bleed     secondary to avm    ROS:   All systems reviewed and negative except as noted in the HPI.   Past Surgical History  Procedure Date  . Back surgery     X 2  . Tonsillectomy   . Cardiac defibrillator placement      Family History  Problem Relation Age of Onset  . Heart failure Mother   . Diabetes Sister   . Colon cancer Brother     questionable  . Prostate cancer Neg Hx      History   Social History  . Marital Status: Married    Spouse Name: N/A    Number of Children: N/A  . Years of Education: N/A   Occupational History  . 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     Social History Main Topics  . Smoking status: Current Every Day Smoker -- 2.0 packs/day for 55 years    Types: Cigarettes  . Smokeless tobacco: Never Used   Comment: down to 6 cigarettes a day  . Alcohol Use:  No     quit at the time of stroke  . Drug Use: No     quit at the time of his stroke  . Sexually Active: Not Currently   Other Topics Concern  . Not on file   Social History Narrative   7th grade education. Married '67. 3 sons - '67, 72, '74; 1 daughter '69. Worked for [employer]. Retired on disability due to CVA with right hemiparesis '10     BP 122/58  Pulse 54  Ht 5\' 8"  (1.727 m)  SpO2 99%  Physical Exam: Chronically ill appearing NAD HEENT: Unremarkable with no facial droop Neck:  7 cm JVD, no thyromegally Lungs:  Clear HEART:  Regular rate rhythm, no murmurs, no rubs, no clicks Abd:  soft, positive bowel sounds, no organomegally, no rebound, no guarding Ext:  2 plus pulses, no edema, no cyanosis, no clubbing Skin:  No rashes no nodules Neuro:  CN II through XII intact, motor grossly intact  DEVICE  Normal device function.  See PaceArt for details.   Assess/Plan:

## 2012-01-01 NOTE — Interval H&P Note (Signed)
History and Physical Interval Note:  In the interim, the patient has reached device elective replacement. I've discussed the risk, goals, benefits, and expectations of device removal and insertion of a new device and he wishes to proceed. 01/01/2012 10:06 AM  [name]  has presented today for surgery, with the diagnosis of chf  The various methods of treatment have been discussed with the patient and family. After consideration of risks, benefits and other options for treatment, the patient has consented to  Procedure(s) (LRB) with comments: ICD GENERATOR CHANGE (N/A) as a surgical intervention .  The patient's history has been reviewed, patient examined, no change in status, stable for surgery.  I have reviewed the patient's chart and labs.  Questions were answered to the patient's satisfaction.     Lewayne Bunting

## 2012-01-01 NOTE — Op Note (Signed)
ICD removal and insertion of a new device without immediate complication. DFT 17 joules. Z#[id number].

## 2012-01-02 NOTE — Op Note (Signed)
NAME:  [name], SODERMAN NO.:  [id number]  MEDICAL RECORD NO.:  [id number]  LOCATION:  [facility]                         FACILITY:  [facility]  PHYSICIAN:  Doylene Canning. Ladona Ridgel, MD    DATE OF BIRTH:  October 19, 1940  DATE OF PROCEDURE:  01/01/2012 DATE OF DISCHARGE:  01/01/2012                              OPERATIVE REPORT   PROCEDURE PERFORMED:  Removal of previously implanted ICD followed by ICD lead revision, followed by insertion of a new ICD with defibrillation threshold testing.  INTRODUCTION:  The patient is a 71 year old male with longstanding ischemic cardiomyopathy, diabetes, hypertension and VT who is status post ICD insertion.  He has reached elective replacement.  He is now referred for ICD removal and insertion of a new device.  PROCEDURE:  After informed consent was obtained, the patient was taken to the diagnostic EP lab in a fasting state.  After usual preparation and draping, intravenous fentanyl and midazolam was given for sedation. A 30 mL of lidocaine was infiltrated into the left infraclavicular region.  A 7-cm incision was carried out over this region. Electrocautery was utilized to dissect down to the fascial plane.  The ICD generator was removed with gentle traction.  The pocket was irrigated with antibiotic irrigation.  The leads were evaluated.  The R- waves were 18, the pace impedance was 600 ohms, the threshold was 0.7 V at 0.4 milliseconds.  To accommodate the larger footprint of the new device, the ICD pocket was revised with electrocautery.  Hemostasis was obtained with electrocautery.  The new [employer] ICD serial number [phone] was connected to the defibrillation lead and placed back in the subcutaneous pocket.  The pocket was irrigated with additional antibiotic irrigation and incision was closed with 2-0 and 3- 0 Vicryl.  At this point, I scrubbed out of the case for a supervised defibrillation threshold testing.  After the  patient was more deeply sedated under my direct supervision, VF was induced with a T-wave shock.  A 17-joule shock was delivered through the device, terminated and ventricular fibrillation was restored in sinus rhythm.  No additional defibrillation threshold testing was carried out.  Benzoin and Steri-Strips were painted on the incision.  A pressure dressing was applied and the patient returned to his room in satisfactory condition.  COMPLICATIONS:  There were no immediate procedure complications.  RESULTS:  It demonstrate successful removal of previously implanted [employer] single-chamber defibrillator, insertion of a new [employer] single-chamber defibrillator without immediate procedure complication.     Doylene Canning. Ladona Ridgel, MD    GWT/MEDQ  D:  01/01/2012  T:  01/02/2012  Job:  [id number]

## 2012-01-03 ENCOUNTER — Encounter: Payer: Self-pay | Admitting: *Deleted

## 2012-01-29 ENCOUNTER — Other Ambulatory Visit: Payer: Self-pay | Admitting: Internal Medicine

## 2012-02-01 ENCOUNTER — Other Ambulatory Visit: Payer: Self-pay | Admitting: Internal Medicine

## 2012-03-27 ENCOUNTER — Telehealth: Payer: Self-pay | Admitting: *Deleted

## 2012-03-27 ENCOUNTER — Other Ambulatory Visit: Payer: Self-pay | Admitting: Internal Medicine

## 2012-03-27 ENCOUNTER — Other Ambulatory Visit (HOSPITAL_COMMUNITY): Payer: Self-pay | Admitting: Internal Medicine

## 2012-03-27 ENCOUNTER — Telehealth: Payer: Self-pay | Admitting: Internal Medicine

## 2012-03-27 MED ORDER — FINASTERIDE 5 MG PO TABS: 5.0000 mg | ORAL_TABLET | Freq: Every day | ORAL | Status: DC

## 2012-03-27 NOTE — Telephone Encounter (Signed)
01-11-12 lmm @ 935am for wound ck with device 01-22-12 lmm @ 1103am 02-13-12 lmm @ 1049am

## 2012-03-28 ENCOUNTER — Encounter: Payer: Self-pay | Admitting: Internal Medicine

## 2012-03-28 NOTE — Telephone Encounter (Signed)
Ok for refill prn 

## 2012-03-29 ENCOUNTER — Other Ambulatory Visit: Payer: Self-pay | Admitting: *Deleted

## 2012-03-29 MED ORDER — FINASTERIDE 5 MG PO TABS: 5.0000 mg | ORAL_TABLET | Freq: Every day | ORAL | Status: DC

## 2012-04-26 ENCOUNTER — Ambulatory Visit (INDEPENDENT_AMBULATORY_CARE_PROVIDER_SITE_OTHER): Payer: Medicare Other | Admitting: Internal Medicine

## 2012-04-26 ENCOUNTER — Other Ambulatory Visit (INDEPENDENT_AMBULATORY_CARE_PROVIDER_SITE_OTHER): Payer: Medicare Other

## 2012-04-26 ENCOUNTER — Ambulatory Visit: Payer: Medicare Other

## 2012-04-26 ENCOUNTER — Encounter: Payer: Self-pay | Admitting: Internal Medicine

## 2012-04-26 VITALS — BP 138/68 | HR 65 | Temp 98.0°F

## 2012-04-26 DIAGNOSIS — R109 Unspecified abdominal pain: Secondary | ICD-10-CM

## 2012-04-26 LAB — CBC WITH DIFFERENTIAL/PLATELET
Basophils Relative: 1 % (ref 0–1)
Eosinophils Absolute: 0.1 10*3/uL (ref 0.0–0.7)
Eosinophils Relative: 1 % (ref 0–5)
Hemoglobin: 10.5 g/dL — ABNORMAL LOW (ref 13.0–17.0)
Lymphs Abs: 2.2 10*3/uL (ref 0.7–4.0)
MCH: 30.6 pg (ref 26.0–34.0)
MCHC: 33.4 g/dL (ref 30.0–36.0)
MCV: 91.5 fL (ref 78.0–100.0)
Monocytes Relative: 10 % (ref 3–12)
Platelets: 239 10*3/uL (ref 150–400)
RBC: 3.43 MIL/uL — ABNORMAL LOW (ref 4.22–5.81)

## 2012-04-26 LAB — HEPATIC FUNCTION PANEL
ALT: 13 U/L (ref 0–53)
Alkaline Phosphatase: 65 U/L (ref 39–117)
Bilirubin, Direct: 0.1 mg/dL (ref 0.0–0.3)
Total Bilirubin: 0.5 mg/dL (ref 0.3–1.2)
Total Protein: 7.7 g/dL (ref 6.0–8.3)

## 2012-04-26 LAB — BASIC METABOLIC PANEL
CO2: 30 mEq/L (ref 19–32)
Chloride: 104 mEq/L (ref 96–112)
Creatinine, Ser: 1.1 mg/dL (ref 0.4–1.5)
Sodium: 141 mEq/L (ref 135–145)

## 2012-04-26 NOTE — Patient Instructions (Signed)
Abdominal Pain  Abdominal pain can be caused by many things. Your caregiver decides the seriousness of your pain by an examination and possibly blood tests and X-rays. Many cases can be observed and treated at home. Most abdominal pain is not caused by a disease and will probably improve without treatment. However, in many cases, more time must pass before a clear cause of the pain can be found. Before that point, it may not be known if you need more testing, or if hospitalization or surgery is needed.  HOME CARE INSTRUCTIONS   · Do not take laxatives unless directed by your caregiver.  · Take pain medicine only as directed by your caregiver.  · Only take over-the-counter or prescription medicines for pain, discomfort, or fever as directed by your caregiver.  · Try a clear liquid diet (broth, tea, or water) for as long as directed by your caregiver. Slowly move to a bland diet as tolerated.  SEEK IMMEDIATE MEDICAL CARE IF:   · The pain does not go away.  · You have a fever.  · You keep throwing up (vomiting).  · The pain is felt only in portions of the abdomen. Pain in the right side could possibly be appendicitis. In an adult, pain in the left lower portion of the abdomen could be colitis or diverticulitis.  · You pass bloody or black tarry stools.  MAKE SURE YOU:   · Understand these instructions.  · Will watch your condition.  · Will get help right away if you are not doing well or get worse.  Document Released: 11/02/2004 Document Revised: 04/17/2011 Document Reviewed: 09/11/2007  Exit[employer]® Patient Information ©2013 [employer].

## 2012-04-26 NOTE — Progress Notes (Addendum)
Subjective:    Patient ID: [name], male    DOB: Nov 10, 1940, 72 y.o.   MRN: [id number]  HPI  Pt presents to the clinic today with c/o stomach pain. This started 1 week ago. It is the bottom of his abdomen. It is a constant ache. He is having regular BM's but they are not every day. He denies nausea, vomiting or diarrhea. He has not had any blood in his stool. He has not taken anything for the pain. He denies all urinary symptoms. He is not having chest pain, chest tightness or shortness of breath. He has not had pain in the bottom of his stomach like this before. He does have reflux but states this feel completely different.   Review of Systems      Past Medical History  Diagnosis Date  . 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 Automatic implantable cardiac defibrillator in situ   . Congestive heart failure, unspecified   . CVA (cerebral vascular accident)   . Retention of urine, unspecified   . Pneumonia, organism unspecified   . Cough   . Acute bronchitis   . Contusion of foot   . Other sign and symptom in breast   . Fibromyalgia   . Hyperlipidemia   . Coronary atherosclerosis of unspecified type of vessel, native or graft   . Esophageal reflux   . Type I (juvenile type) diabetes mellitus without mention of complication, uncontrolled   . Essential hypertension, benign   . Cocaine abuse   . Alcohol abuse   . Microalbuminuria   . GERD (gastroesophageal reflux disease)   . Arteriovenous malformation (AVM)     of colon  . Diverticulosis   . GI bleed     secondary to avm    Current Outpatient Prescriptions  Medication Sig Dispense Refill  . BENICAR 40 MG tablet TAKE ONE TABLET BY MOUTH EVERY DAY  30 tablet  2  . carvedilol (COREG) 25 MG tablet TAKE ONE-HALF TABLET BY MOUTH TWICE DAILY  30 tablet  4  . cloNIDine (CATAPRES) 0.2 MG tablet TAKE ONE TABLET BY MOUTH TWICE DAILY  60 tablet  4  . cloNIDine (CATAPRES) 0.2 MG tablet TAKE ONE TABLET BY MOUTH  TWICE DAILY  60 tablet  0  . finasteride (PROSCAR) 5 MG tablet Take 1 tablet (5 mg total) by mouth daily.  30 tablet  prn  . furosemide (LASIX) 40 MG tablet TAKE ONE TABLET BY MOUTH EVERY DAY  30 tablet  2  . hydrALAZINE (APRESOLINE) 25 MG tablet TAKE ONE TABLET BY MOUTH THREE TIMES DAILY. PATIENT NEEDS TO SCHEDULE PHYSICAL EXAM FOR FURTHER REFILLS.  90 tablet  2  . insulin NPH (HUMULIN N,NOVOLIN N) 100 UNIT/ML injection Inject 30 Units into the skin every morning.       Marland Kitchen OVER THE COUNTER MEDICATION Take 1 tablet by mouth 2 (two) times a week. Vegetable Laxative OTC      . potassium chloride SA (KLOR-CON M20) 20 MEQ tablet Take 20 mEq by mouth 2 (two) times daily.       . sertraline (ZOLOFT) 50 MG tablet TAKE ONE TABLET BY MOUTH EVERY DAY  30 tablet  0  . simvastatin (ZOCOR) 40 MG tablet Take 40 mg by mouth at bedtime.       . Tamsulosin HCl (FLOMAX) 0.4 MG CAPS Take 0.4 mg by mouth at bedtime.       . Tamsulosin HCl (FLOMAX) 0.4 MG CAPS TAKE ONE  CAPSULE BY MOUTH AT BEDTIME. NEEDS TO SCHEDULE PHYSICAL EXAM FOR FURTHER REFILLS  90 capsule  0   No current facility-administered medications for this visit.    Allergies  Allergen Reactions  . Metformin Other (See Comments)    REACTION: diarrhea    Family History  Problem Relation Age of Onset  . Heart failure Mother   . Diabetes Sister   . Colon cancer Brother     questionable  . Prostate cancer Neg Hx     History   Social History  . Marital Status: Married    Spouse Name: N/A    Number of Children: N/A  . Years of Education: N/A   Occupational History  . 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     Social History Main Topics  . Smoking status: Current Every Day Smoker -- 2.00 packs/day for 55 years    Types: Cigarettes  . Smokeless tobacco: Never Used     Comment: down to 6 cigarettes a day  . Alcohol Use: No     Comment: quit at the time of stroke  . Drug Use: No     Comment:  quit at the time of his stroke  . Sexually Active: Not Currently   Other Topics Concern  . Not on file   Social History Narrative   7th grade education. Married '67. 3 sons - '67, 72, '74; 1 daughter '69. Worked for [employer]. Retired on disability due to CVA with right hemiparesis '10              Constitutional: Denies fever, malaise, fatigue, headache or abrupt weight changes.  Respiratory: Denies difficulty breathing, shortness of breath, cough or sputum production.   Cardiovascular: Denies chest pain, chest tightness, palpitations or swelling in the hands or feet.  Gastrointestinal: Pt reports abdominal pain. Denies bloating, constipation, diarrhea or blood in the stool.  GU: Denies urgency, frequency, pain with urination, burning sensation, blood in urine, odor or discharge.   No other specific complaints in a complete review of systems (except as listed in HPI above).  Objective:   Physical Exam   BP 138/68  Pulse 65  Temp(Src) 98 F (36.7 C) (Oral)  SpO2 95% Wt Readings from Last 3 Encounters:  01/01/12 225 lb (102.059 kg)  01/01/12 225 lb (102.059 kg)  02/21/10 223 lb (101.152 kg)    General: Appears his stated age, well developed, well nourished in NAD.Marland Kitchen  Cardiovascular: Normal rate and rhythm. S1,S2 noted.  No murmur, rubs or gallops noted. No JVD or BLE edema. No carotid bruits noted. Pulmonary/Chest: Normal effort and positive vesicular breath sounds. No respiratory distress. No wheezes, rales or ronchi noted.  Abdomen: Soft and tender in BLQ. Normal bowel sounds, no bruits noted. No distention or masses noted. Liver, spleen and kidneys non palpable.       Assessment & Plan:   Abdominal pain, new onset with additional workup required:  Instructed pt to take tylenol as needed over the weekend Will obtain CT scan of the abdomen  If pain gets worse over the weekend of ir you develop blood in your stool, chest pain, chest tightness or shortness of  breath, go to the ER immediately

## 2012-04-26 NOTE — Addendum Note (Signed)
Addended by: Lorre Munroe on: 04/26/2012 03:54 PM   Modules accepted: Orders

## 2012-04-27 ENCOUNTER — Other Ambulatory Visit: Payer: Self-pay | Admitting: Internal Medicine

## 2012-04-29 NOTE — Telephone Encounter (Signed)
Rx request to pharmacy/SLS  

## 2012-04-30 ENCOUNTER — Other Ambulatory Visit: Payer: Self-pay | Admitting: Internal Medicine

## 2012-05-03 ENCOUNTER — Ambulatory Visit (INDEPENDENT_AMBULATORY_CARE_PROVIDER_SITE_OTHER)
Admission: RE | Admit: 2012-05-03 | Discharge: 2012-05-03 | Disposition: A | Discharge status: Home / Self Care | Payer: Medicare Other | Source: Ambulatory Visit | Attending: Internal Medicine | Admitting: Internal Medicine

## 2012-05-03 DIAGNOSIS — R109 Unspecified abdominal pain: Secondary | ICD-10-CM

## 2012-05-03 MED ORDER — IOHEXOL 300 MG/ML  SOLN
100.0000 mL | Freq: Once | INTRAMUSCULAR | Status: AC | PRN
  Administered 2012-05-03: 100 mL via INTRAVENOUS

## 2012-05-15 ENCOUNTER — Encounter: Payer: Medicare Other | Admitting: Internal Medicine

## 2012-05-16 ENCOUNTER — Encounter: Payer: Medicare Other | Admitting: Internal Medicine

## 2012-05-30 ENCOUNTER — Other Ambulatory Visit: Payer: Self-pay | Admitting: Internal Medicine

## 2012-07-03 ENCOUNTER — Other Ambulatory Visit: Payer: Self-pay | Admitting: Internal Medicine

## 2012-07-11 ENCOUNTER — Encounter: Payer: Self-pay | Admitting: Internal Medicine

## 2012-07-16 ENCOUNTER — Other Ambulatory Visit: Payer: Self-pay | Admitting: *Deleted

## 2012-07-16 MED ORDER — GLUCOSE BLOOD VI STRP: ORAL_STRIP | Status: DC

## 2012-08-01 ENCOUNTER — Encounter: Payer: Self-pay | Admitting: Internal Medicine

## 2012-08-01 ENCOUNTER — Ambulatory Visit (INDEPENDENT_AMBULATORY_CARE_PROVIDER_SITE_OTHER): Payer: Medicare Other | Admitting: *Deleted

## 2012-08-01 DIAGNOSIS — I509 Heart failure, unspecified: Secondary | ICD-10-CM

## 2012-08-01 DIAGNOSIS — Z95810 Presence of automatic (implantable) cardiac defibrillator: Secondary | ICD-10-CM

## 2012-08-02 ENCOUNTER — Other Ambulatory Visit: Payer: Self-pay | Admitting: Internal Medicine

## 2012-08-07 ENCOUNTER — Encounter: Payer: Self-pay | Admitting: Internal Medicine

## 2012-08-08 ENCOUNTER — Encounter: Payer: Self-pay | Admitting: Internal Medicine

## 2012-08-08 ENCOUNTER — Telehealth: Payer: Self-pay | Admitting: Internal Medicine

## 2012-08-08 NOTE — Telephone Encounter (Signed)
07-11-12 lmm @ 1039am/ sent past due letter/mt 08-08-12 sent certified past due letter/mt

## 2012-08-12 LAB — REMOTE ICD DEVICE
BRDY-0002RV: 40 {beats}/min
DEV-0020ICD: NEGATIVE
HV IMPEDENCE: 50 Ohm
RV LEAD IMPEDENCE ICD: 699 Ohm
TZAT-0001FASTVT: 2
TZAT-0002FASTVT: NEGATIVE
TZAT-0013FASTVT: 6
TZAT-0018FASTVT: NEGATIVE
TZAT-0018FASTVT: NEGATIVE
TZST-0001FASTVT: 4
TZST-0001FASTVT: 5
TZST-0003FASTVT: 31 J
TZST-0003FASTVT: 41 J
TZST-0003FASTVT: 41 J
VF: 0

## 2012-08-13 ENCOUNTER — Ambulatory Visit (INDEPENDENT_AMBULATORY_CARE_PROVIDER_SITE_OTHER): Payer: Medicare Other | Admitting: Internal Medicine

## 2012-08-13 ENCOUNTER — Encounter: Payer: Self-pay | Admitting: Internal Medicine

## 2012-08-13 VITALS — BP 108/60 | HR 51 | Temp 97.4°F

## 2012-08-13 DIAGNOSIS — I69959 Hemiplegia and hemiparesis following unspecified cerebrovascular disease affecting unspecified side: Secondary | ICD-10-CM

## 2012-08-13 DIAGNOSIS — I509 Heart failure, unspecified: Secondary | ICD-10-CM

## 2012-08-13 DIAGNOSIS — E1065 Type 1 diabetes mellitus with hyperglycemia: Secondary | ICD-10-CM

## 2012-08-13 DIAGNOSIS — E785 Hyperlipidemia, unspecified: Secondary | ICD-10-CM

## 2012-08-13 DIAGNOSIS — I10 Essential (primary) hypertension: Secondary | ICD-10-CM

## 2012-08-13 NOTE — Assessment & Plan Note (Signed)
Lab Results  Component Value Date   HGBA1C 8.8* 07/15/2010   Patient is due for follow up lab. Will base changes in medication regimen on results. He will return for lab when possible.

## 2012-08-13 NOTE — Assessment & Plan Note (Signed)
BP Readings from Last 3 Encounters:  08/13/12 108/60  04/26/12 138/68  01/01/12 151/67   Good control today on this regimen. No changes at this time.

## 2012-08-13 NOTE — Progress Notes (Signed)
Subjective:    Patient ID: [name], male    DOB: 1940-09-24, 72 y.o.   MRN: [id number]  HPI [name] was seen in March '14 by [name] for abdominal pain. Exam was unrevealing. CT with atherosclerosis, DDD, Sacroiliitis, multiple small kidney stones w/o obstruction. He may have passed a stone.  He presents today for chronic cough which does not respond to cough syrup. He denies fevers, chills or increased SOB. He does have occasional phlegm. His wife reports that he does cough with meals and that she was told that the right side of his didn't work well after his stroke. She has changed his diet to soft foods, of which he doesn't eat much. He also has coughing spells at night when he goes to bed even with the head of the bed elevated. Using O2 seems to help. On questioning he isn't sure if he is having food and fluids "go down the wrong pipe."  Nutrition is an issue - he doesn't eat much although his weight doesn't seem to have changed. He is diabetic and she reports that his CBGs have been high.  Past Medical History  Diagnosis Date  . 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 Automatic implantable cardiac defibrillator in situ   . Congestive heart failure, unspecified   . CVA (cerebral vascular accident)   . Retention of urine, unspecified   . Pneumonia, organism unspecified   . Cough   . Acute bronchitis   . Contusion of foot   . Other sign and symptom in breast   . Fibromyalgia   . Hyperlipidemia   . Coronary atherosclerosis of unspecified type of vessel, native or graft   . Esophageal reflux   . Type I (juvenile type) diabetes mellitus without mention of complication, uncontrolled   . Essential hypertension, benign   . Cocaine abuse   . Alcohol abuse   . Microalbuminuria   . GERD (gastroesophageal reflux disease)   . Arteriovenous malformation (AVM)     of colon  . Diverticulosis   . GI bleed     secondary to avm   Past Surgical History  Procedure  Laterality Date  . Back surgery      X 2  . Tonsillectomy    . Cardiac defibrillator placement     Family History  Problem Relation Age of Onset  . Heart failure Mother   . Diabetes Sister   . Colon cancer Brother     questionable  . Prostate cancer Neg Hx    History   Social History  . Marital Status: Married    Spouse Name: N/A    Number of Children: N/A  . Years of Education: N/A   Occupational History  . 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     Social History Main Topics  . Smoking status: Current Every Day Smoker -- 2.00 packs/day for 55 years    Types: Cigarettes  . Smokeless tobacco: Never Used     Comment: down to 6 cigarettes a day  . Alcohol Use: No     Comment: quit at the time of stroke  . Drug Use: No     Comment: quit at the time of his stroke  . Sexually Active: Not Currently   Other Topics Concern  . Not on file   Social History Narrative   7th grade education. Married '67. 3 sons - '67, 72, '74;  1 daughter '69. Worked for [employer]. 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 BENICAR 40 MG tablet TAKE ONE TABLET BY MOUTH EVERY DAY. PATIENT NEEDS TO SCHEDULE PHYSICAL EXAM FOR FURTHER REFILLS.  30 tablet  0  . BENICAR 40 MG tablet TAKE ONE TABLET BY MOUTH ONCE DAILY  30 tablet  0  . carvedilol (COREG) 25 MG tablet TAKE ONE-HALF TABLET BY MOUTH TWICE DAILY  30 tablet  0  . cloNIDine (CATAPRES) 0.2 MG tablet TAKE ONE TABLET BY MOUTH TWICE DAILY  60 tablet  0  . finasteride (PROSCAR) 5 MG tablet Take 1 tablet (5 mg total) by mouth daily.  30 tablet  prn  . furosemide (LASIX) 40 MG tablet TAKE ONE TABLET BY MOUTH ONCE DAILY  30 tablet  5  . glucose blood test strip TEST TWO TIMES DAILY  AS DIRECTED  100 each  10  . hydrALAZINE (APRESOLINE) 25 MG tablet TAKE ONE TABLET BY MOUTH  THREE TIMES DAILY PATIENT  NEEDS  APPT.  FOR  PHYSICAL  EXAM  90 tablet  0  . NOVOLIN N RELION 100 UNIT/ML injection INJECT 20 UNITS SUBCUTANEOUSLY AT BEDTIME AS DIRECTED  10 mL  5  . OVER THE COUNTER MEDICATION Take 1 tablet by mouth 2 (two) times a week. Vegetable Laxative OTC      . potassium chloride SA (KLOR-CON M20) 20 MEQ tablet Take 20 mEq by mouth 2 (two) times daily.       . sertraline (ZOLOFT) 50 MG tablet TAKE ONE TABLET BY MOUTH EVERY DAY  30 tablet  0  . simvastatin (ZOCOR) 40 MG tablet TAKE ONE TABLET BY MOUTH EVERY DAY AT BEDTIME  30 tablet  5  . tamsulosin (FLOMAX) 0.4 MG CAPS Take 1 capsule (0.4 mg total) by mouth daily.  30 capsule  1  . insulin NPH (HUMULIN N,NOVOLIN N) 100 UNIT/ML injection Inject 30 Units into the skin every morning.        No current facility-administered medications on file prior to visit.      Review of Systems System review is negative for any constitutional, cardiac, pulmonary, GI or neuro symptoms or complaints other than as described in the HPI.     Objective:   Physical Exam Filed Vitals:   08/13/12 1646  BP: 108/60  Pulse: 51  Temp: 97.4 F (36.3 C)   Wt Readings from Last 3 Encounters:  01/01/12 225 lb (102.059 kg)  01/01/12 225 lb (102.059 kg)  02/21/10 223 lb (101.152 kg)   Cannot get current weight - he is w/c bound  BP Readings from Last 3 Encounters:  08/13/12 108/60  04/26/12 138/68  01/01/12 151/67   Gen'l - older AA man in no distress and not coughing during his visit HEENT - C&S clear Neck- supple, no thyromegaly Cor- 2+ radial left, RRR Pulm - shallow inspirations, no rales or wheezes. Neuro - awake and alert, communicates easily. No facial deformity. No use of his right arm. Gag not tested.       Assessment & Plan:  Nutrition - a home made supplement may help:  Use sugar free ice cream or yogurt as a base, plus or minus milk  Add a teapsoon or so of protein powder and vitamin powder available as  supplements in the drug store or a  health food store  Flavor of choice - fruit, etc.

## 2012-08-13 NOTE — Assessment & Plan Note (Signed)
Last lipid panel in '12 - had very good control at the time.  PLan  Follow up lipid panel with recommendations to follow.

## 2012-08-13 NOTE — Assessment & Plan Note (Signed)
No evidence of decompensation at todays visit.  Plan Continue present medications

## 2012-08-13 NOTE — Patient Instructions (Addendum)
Chronic cough - no evidence of infection. 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  Plan A dysphagia diet - soft foods, using "Thick-It" to make liquids a nectar consistency that is easier to swallow and will cause less coughing   Nutrition - a home made supplement may help:  Use sugar free ice cream or yogurt as a base, plus or minus milk  Add a teapsoon or so of protein powder and vitamin powder available as supplements in the drug store or a health food store  Flavor of choice - fruit, etc.   You should always be upright when eating or drinking.  You should do a slight chin tuck when you swallow.

## 2012-08-13 NOTE — Assessment & Plan Note (Signed)
stable since stroke in 2011 with virtually no recovery from right sided hemiparesis. Now with cough w/o signs of infection. Suspect he has significant dysphagia with aspiration as the cause of his cough.  Plan A dysphagia diet - soft foods, using "Thick-It" to make liquids a nectar consistency that is easier to swallow and will cause less coughing

## 2012-08-16 ENCOUNTER — Encounter: Payer: Self-pay | Admitting: *Deleted

## 2012-08-27 ENCOUNTER — Encounter: Payer: Medicare Other | Admitting: Cardiology

## 2012-08-29 ENCOUNTER — Other Ambulatory Visit: Payer: Self-pay | Admitting: Internal Medicine

## 2012-09-02 ENCOUNTER — Other Ambulatory Visit: Payer: Self-pay | Admitting: Internal Medicine

## 2012-10-02 ENCOUNTER — Other Ambulatory Visit: Payer: Self-pay | Admitting: Internal Medicine

## 2012-10-02 NOTE — Telephone Encounter (Signed)
Okay for refills.

## 2012-12-14 ENCOUNTER — Emergency Department (HOSPITAL_COMMUNITY): Payer: Medicare Other

## 2012-12-14 ENCOUNTER — Inpatient Hospital Stay (HOSPITAL_COMMUNITY)
Admission: EM | Admit: 2012-12-14 | Discharge: 2012-12-16 | DRG: 205 | Disposition: A | Discharge status: Home / Self Care | Payer: Medicare Other | Attending: Internal Medicine | Admitting: Internal Medicine

## 2012-12-14 ENCOUNTER — Encounter (HOSPITAL_COMMUNITY): Payer: Self-pay | Admitting: Emergency Medicine

## 2012-12-14 DIAGNOSIS — I2510 Atherosclerotic heart disease of native coronary artery without angina pectoris: Secondary | ICD-10-CM | POA: Diagnosis present

## 2012-12-14 DIAGNOSIS — J189 Pneumonia, unspecified organism: Secondary | ICD-10-CM

## 2012-12-14 DIAGNOSIS — D638 Anemia in other chronic diseases classified elsewhere: Secondary | ICD-10-CM | POA: Diagnosis present

## 2012-12-14 DIAGNOSIS — I509 Heart failure, unspecified: Secondary | ICD-10-CM

## 2012-12-14 DIAGNOSIS — I10 Essential (primary) hypertension: Secondary | ICD-10-CM | POA: Diagnosis present

## 2012-12-14 DIAGNOSIS — R55 Syncope and collapse: Secondary | ICD-10-CM

## 2012-12-14 DIAGNOSIS — I5022 Chronic systolic (congestive) heart failure: Secondary | ICD-10-CM | POA: Diagnosis present

## 2012-12-14 DIAGNOSIS — IMO0002 Reserved for concepts with insufficient information to code with codable children: Secondary | ICD-10-CM | POA: Diagnosis present

## 2012-12-14 DIAGNOSIS — R4182 Altered mental status, unspecified: Secondary | ICD-10-CM

## 2012-12-14 DIAGNOSIS — K219 Gastro-esophageal reflux disease without esophagitis: Secondary | ICD-10-CM | POA: Diagnosis present

## 2012-12-14 DIAGNOSIS — F17200 Nicotine dependence, unspecified, uncomplicated: Secondary | ICD-10-CM | POA: Diagnosis present

## 2012-12-14 DIAGNOSIS — J9811 Atelectasis: Secondary | ICD-10-CM

## 2012-12-14 DIAGNOSIS — F1410 Cocaine abuse, uncomplicated: Secondary | ICD-10-CM | POA: Diagnosis present

## 2012-12-14 DIAGNOSIS — R05 Cough: Secondary | ICD-10-CM

## 2012-12-14 DIAGNOSIS — I69959 Hemiplegia and hemiparesis following unspecified cerebrovascular disease affecting unspecified side: Secondary | ICD-10-CM

## 2012-12-14 DIAGNOSIS — Z79899 Other long term (current) drug therapy: Secondary | ICD-10-CM

## 2012-12-14 DIAGNOSIS — Z95810 Presence of automatic (implantable) cardiac defibrillator: Secondary | ICD-10-CM

## 2012-12-14 DIAGNOSIS — I428 Other cardiomyopathies: Secondary | ICD-10-CM | POA: Diagnosis present

## 2012-12-14 DIAGNOSIS — E1065 Type 1 diabetes mellitus with hyperglycemia: Secondary | ICD-10-CM

## 2012-12-14 DIAGNOSIS — Z794 Long term (current) use of insulin: Secondary | ICD-10-CM

## 2012-12-14 DIAGNOSIS — N401 Enlarged prostate with lower urinary tract symptoms: Secondary | ICD-10-CM

## 2012-12-14 DIAGNOSIS — I69991 Dysphagia following unspecified cerebrovascular disease: Secondary | ICD-10-CM

## 2012-12-14 DIAGNOSIS — J9819 Other pulmonary collapse: Principal | ICD-10-CM | POA: Diagnosis present

## 2012-12-14 DIAGNOSIS — E785 Hyperlipidemia, unspecified: Secondary | ICD-10-CM | POA: Diagnosis present

## 2012-12-14 DIAGNOSIS — IMO0001 Reserved for inherently not codable concepts without codable children: Secondary | ICD-10-CM | POA: Diagnosis present

## 2012-12-14 DIAGNOSIS — I5023 Acute on chronic systolic (congestive) heart failure: Secondary | ICD-10-CM | POA: Diagnosis present

## 2012-12-14 DIAGNOSIS — R569 Unspecified convulsions: Secondary | ICD-10-CM

## 2012-12-14 DIAGNOSIS — I4510 Unspecified right bundle-branch block: Secondary | ICD-10-CM | POA: Diagnosis present

## 2012-12-14 DIAGNOSIS — Z23 Encounter for immunization: Secondary | ICD-10-CM

## 2012-12-14 LAB — CBC WITH DIFFERENTIAL/PLATELET
Basophils Absolute: 0 10*3/uL (ref 0.0–0.1)
Basophils Relative: 0 % (ref 0–1)
Eosinophils Absolute: 0.1 10*3/uL (ref 0.0–0.7)
Eosinophils Relative: 2 % (ref 0–5)
HCT: 28.2 % — ABNORMAL LOW (ref 39.0–52.0)
Hemoglobin: 9.2 g/dL — ABNORMAL LOW (ref 13.0–17.0)
MCH: 32.2 pg (ref 26.0–34.0)
MCHC: 32.6 g/dL (ref 30.0–36.0)
MCV: 98.6 fL (ref 78.0–100.0)
Monocytes Absolute: 0.8 10*3/uL (ref 0.1–1.0)
Monocytes Relative: 9 % (ref 3–12)
RBC: 2.86 MIL/uL — ABNORMAL LOW (ref 4.22–5.81)
RDW: 13.5 % (ref 11.5–15.5)

## 2012-12-14 LAB — BASIC METABOLIC PANEL
BUN: 17 mg/dL (ref 6–23)
Calcium: 8.7 mg/dL (ref 8.4–10.5)
Creatinine, Ser: 1.11 mg/dL (ref 0.50–1.35)
GFR calc Af Amer: 75 mL/min — ABNORMAL LOW (ref >90)
GFR calc non Af Amer: 64 mL/min — ABNORMAL LOW (ref >90)
Potassium: 3.8 mEq/L (ref 3.5–5.1)

## 2012-12-14 LAB — URINALYSIS, ROUTINE W REFLEX MICROSCOPIC
Bilirubin Urine: NEGATIVE
Hgb urine dipstick: NEGATIVE
Ketones, ur: NEGATIVE mg/dL
Specific Gravity, Urine: 1.01 (ref 1.005–1.030)
Urobilinogen, UA: 0.2 mg/dL (ref 0.0–1.0)
pH: 5 (ref 5.0–8.0)

## 2012-12-14 LAB — CG4 I-STAT (LACTIC ACID): Lactic Acid, Venous: 1.62 mmol/L (ref 0.5–2.2)

## 2012-12-14 LAB — POCT I-STAT TROPONIN I: Troponin i, poc: 0.01 ng/mL (ref 0.00–0.08)

## 2012-12-14 LAB — PRO B NATRIURETIC PEPTIDE: Pro B Natriuretic peptide (BNP): 1410 pg/mL — ABNORMAL HIGH (ref 0–125)

## 2012-12-14 LAB — GLUCOSE, CAPILLARY: Glucose-Capillary: 180 mg/dL — ABNORMAL HIGH (ref 70–99)

## 2012-12-14 MED ORDER — SODIUM CHLORIDE 0.9 % IJ SOLN
3.0000 mL | Freq: Two times a day (BID) | INTRAMUSCULAR | Status: DC
  Administered 2012-12-14 – 2012-12-16 (×3): 3 mL via INTRAVENOUS

## 2012-12-14 MED ORDER — DEXTROSE 5 % IV SOLN
500.0000 mg | Freq: Once | INTRAVENOUS | Status: AC
  Administered 2012-12-14: 500 mg via INTRAVENOUS

## 2012-12-14 MED ORDER — ACETAMINOPHEN 325 MG PO TABS: 650.0000 mg | ORAL_TABLET | Freq: Four times a day (QID) | ORAL | Status: DC | PRN

## 2012-12-14 MED ORDER — SODIUM CHLORIDE 0.9 % IV SOLN: 1000.0000 mg | Freq: Once | INTRAVENOUS | Status: DC

## 2012-12-14 MED ORDER — ACETAMINOPHEN 650 MG RE SUPP: 650.0000 mg | Freq: Four times a day (QID) | RECTAL | Status: DC | PRN

## 2012-12-14 MED ORDER — ONDANSETRON HCL 4 MG/2ML IJ SOLN: 4.0000 mg | Freq: Four times a day (QID) | INTRAMUSCULAR | Status: DC | PRN

## 2012-12-14 MED ORDER — CLONIDINE HCL 0.1 MG PO TABS
0.1000 mg | ORAL_TABLET | Freq: Two times a day (BID) | ORAL | Status: DC
  Administered 2012-12-14 – 2012-12-16 (×4): 0.1 mg via ORAL
  Filled 2012-12-14 (×5): qty 1

## 2012-12-14 MED ORDER — POTASSIUM CHLORIDE CRYS ER 20 MEQ PO TBCR
20.0000 meq | EXTENDED_RELEASE_TABLET | Freq: Every day | ORAL | Status: DC
  Administered 2012-12-15 – 2012-12-16 (×2): 20 meq via ORAL
  Filled 2012-12-14 (×2): qty 1

## 2012-12-14 MED ORDER — ASPIRIN EC 81 MG PO TBEC
81.0000 mg | DELAYED_RELEASE_TABLET | Freq: Every day | ORAL | Status: DC
  Administered 2012-12-14 – 2012-12-16 (×3): 81 mg via ORAL
  Filled 2012-12-14 (×3): qty 1

## 2012-12-14 MED ORDER — IRBESARTAN 150 MG PO TABS
150.0000 mg | ORAL_TABLET | Freq: Every day | ORAL | Status: DC
  Administered 2012-12-15 – 2012-12-16 (×2): 150 mg via ORAL
  Filled 2012-12-14 (×2): qty 1

## 2012-12-14 MED ORDER — CARVEDILOL 12.5 MG PO TABS
12.5000 mg | ORAL_TABLET | Freq: Two times a day (BID) | ORAL | Status: DC
  Administered 2012-12-14 – 2012-12-16 (×5): 12.5 mg via ORAL
  Filled 2012-12-14 (×6): qty 1

## 2012-12-14 MED ORDER — ALBUTEROL SULFATE (5 MG/ML) 0.5% IN NEBU: 2.5000 mg | INHALATION_SOLUTION | RESPIRATORY_TRACT | Status: DC | PRN

## 2012-12-14 MED ORDER — ENOXAPARIN SODIUM 40 MG/0.4ML ~~LOC~~ SOLN
40.0000 mg | SUBCUTANEOUS | Status: DC
  Administered 2012-12-14 – 2012-12-16 (×3): 40 mg via SUBCUTANEOUS
  Filled 2012-12-14 (×3): qty 0.4

## 2012-12-14 MED ORDER — FUROSEMIDE 10 MG/ML IJ SOLN
40.0000 mg | Freq: Two times a day (BID) | INTRAMUSCULAR | Status: DC
  Administered 2012-12-14 – 2012-12-16 (×5): 40 mg via INTRAVENOUS
  Filled 2012-12-14 (×4): qty 4

## 2012-12-14 MED ORDER — INSULIN NPH (HUMAN) (ISOPHANE) 100 UNIT/ML ~~LOC~~ SUSP
30.0000 [IU] | Freq: Every day | SUBCUTANEOUS | Status: DC
  Administered 2012-12-15 – 2012-12-16 (×2): 30 [IU] via SUBCUTANEOUS
  Filled 2012-12-14: qty 10

## 2012-12-14 MED ORDER — SODIUM CHLORIDE 0.9 % IV SOLN
1000.0000 mg | Freq: Once | INTRAVENOUS | Status: AC
  Administered 2012-12-14: 22:00:00 1000 mg via INTRAVENOUS
  Filled 2012-12-14 (×2): qty 10

## 2012-12-14 MED ORDER — DEXTROSE 5 % IV SOLN
1.0000 g | INTRAVENOUS | Status: DC
  Administered 2012-12-14 – 2012-12-16 (×3): 1 g via INTRAVENOUS
  Filled 2012-12-14 (×3): qty 10

## 2012-12-14 MED ORDER — INFLUENZA VAC SPLIT QUAD 0.5 ML IM SUSP
0.5000 mL | INTRAMUSCULAR | Status: AC
  Administered 2012-12-15: 09:00:00 0.5 mL via INTRAMUSCULAR
  Filled 2012-12-14: qty 0.5

## 2012-12-14 MED ORDER — FINASTERIDE 5 MG PO TABS
5.0000 mg | ORAL_TABLET | Freq: Every day | ORAL | Status: DC
  Administered 2012-12-15 – 2012-12-16 (×2): 5 mg via ORAL
  Filled 2012-12-14 (×2): qty 1

## 2012-12-14 MED ORDER — SERTRALINE HCL 50 MG PO TABS
50.0000 mg | ORAL_TABLET | Freq: Every day | ORAL | Status: DC
  Administered 2012-12-14 – 2012-12-15 (×2): 50 mg via ORAL
  Filled 2012-12-14 (×3): qty 1

## 2012-12-14 MED ORDER — SODIUM CHLORIDE 0.9 % IV SOLN
500.0000 mg | Freq: Two times a day (BID) | INTRAVENOUS | Status: DC
  Administered 2012-12-15 – 2012-12-16 (×4): 500 mg via INTRAVENOUS
  Filled 2012-12-14 (×6): qty 5

## 2012-12-14 MED ORDER — DEXTROSE 5 % IV SOLN
1.0000 g | Freq: Once | INTRAVENOUS | Status: AC
  Administered 2012-12-14: 1 g via INTRAVENOUS
  Filled 2012-12-14: qty 10

## 2012-12-14 MED ORDER — DEXTROSE 5 % IV SOLN
500.0000 mg | INTRAVENOUS | Status: DC
  Administered 2012-12-14 – 2012-12-16 (×3): 500 mg via INTRAVENOUS
  Filled 2012-12-14 (×3): qty 500

## 2012-12-14 MED ORDER — ONDANSETRON HCL 4 MG PO TABS: 4.0000 mg | ORAL_TABLET | Freq: Four times a day (QID) | ORAL | Status: DC | PRN

## 2012-12-14 MED ORDER — TAMSULOSIN HCL 0.4 MG PO CAPS
0.4000 mg | ORAL_CAPSULE | Freq: Every day | ORAL | Status: DC
  Administered 2012-12-15 – 2012-12-16 (×2): 0.4 mg via ORAL
  Filled 2012-12-14 (×2): qty 1

## 2012-12-14 MED ORDER — ONDANSETRON HCL 4 MG/2ML IJ SOLN: 4.0000 mg | Freq: Three times a day (TID) | INTRAMUSCULAR | Status: AC | PRN

## 2012-12-14 MED ORDER — SIMVASTATIN 40 MG PO TABS
40.0000 mg | ORAL_TABLET | Freq: Every day | ORAL | Status: DC
  Administered 2012-12-14 – 2012-12-16 (×3): 40 mg via ORAL
  Filled 2012-12-14 (×3): qty 1

## 2012-12-14 MED ORDER — HYDROMORPHONE HCL PF 1 MG/ML IJ SOLN: 1.0000 mg | INTRAMUSCULAR | Status: AC | PRN

## 2012-12-14 MED ORDER — INSULIN ASPART 100 UNIT/ML ~~LOC~~ SOLN
0.0000 [IU] | Freq: Three times a day (TID) | SUBCUTANEOUS | Status: DC
  Administered 2012-12-15 – 2012-12-16 (×2): 1 [IU] via SUBCUTANEOUS

## 2012-12-14 MED ORDER — HYDRALAZINE HCL 25 MG PO TABS
25.0000 mg | ORAL_TABLET | Freq: Three times a day (TID) | ORAL | Status: DC
Start: 2012-12-14 — End: 2012-12-16
  Administered 2012-12-14 – 2012-12-16 (×6): 25 mg via ORAL
  Filled 2012-12-14 (×8): qty 1

## 2012-12-14 NOTE — ED Notes (Signed)
Patient transported to XR. 

## 2012-12-14 NOTE — ED Provider Notes (Signed)
CSN: [id number]     Arrival date & time 12/14/12  1225 History   First MD Initiated Contact with Patient 12/14/12 1235     Chief Complaint  Patient presents with  . Near Syncope   (Consider location/radiation/quality/duration/timing/severity/associated sxs/prior Treatment) The history is provided by the patient and medical records.   This is a 72 year old male with extensive past medical history presenting to the ED for near-syncopal episode vs seizure. Patient states the past several days he is still a generalized weakness, slightly increased from his baseline. Patient had a stroke personally 3 years ago, with residual right-sided deficit. States this morning he was sitting upright in a chair, wife was in the kitchen and states she heard some gurgling noises.  States she with him, his eyes were rolled in the back of his head" and he was very stiff, and his entire body began jerking. This lasted for approximately 1 minute. Immediately afterwards he appeared very sleepy and minimally responsive.  Denies fall from chair or head trauma. Upon EMS arrival, patient was more alert but still not back to baseline. No prior hx of seizures.  Pt states he continues to feel generally weak but denies any headache, dizziness, tinnitus, confusion, or changes in speech.  States he has had a cough for the past week, but denies fever, sweats, or chills.  Denies any chest pain shortness of breath, or palpitations.  Pt is on 2L via [place] at night.  VS stable on arrival.  Past Medical History  Diagnosis Date  . 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 Automatic implantable cardiac defibrillator in situ   . Congestive heart failure, unspecified   . CVA (cerebral vascular accident)   . Retention of urine, unspecified   . Pneumonia, organism unspecified   . Cough   . Acute bronchitis   . Contusion of foot   . Other sign and symptom in breast   . Fibromyalgia   . Hyperlipidemia   . Coronary  atherosclerosis of unspecified type of vessel, native or graft   . Esophageal reflux   . Type I (juvenile type) diabetes mellitus without mention of complication, uncontrolled   . Essential hypertension, benign   . Cocaine abuse   . Alcohol abuse   . Microalbuminuria   . GERD (gastroesophageal reflux disease)   . Arteriovenous malformation (AVM)     of colon  . Diverticulosis   . GI bleed     secondary to avm   Past Surgical History  Procedure Laterality Date  . Back surgery      X 2  . Tonsillectomy    . Cardiac defibrillator placement     Family History  Problem Relation Age of Onset  . Heart failure Mother   . Diabetes Sister   . Colon cancer Brother     questionable  . Prostate cancer Neg Hx    History  Substance Use Topics  . Smoking status: Current Every Day Smoker -- 2.00 packs/day for 55 years    Types: Cigarettes  . Smokeless tobacco: Never Used     Comment: down to 6 cigarettes a day  . Alcohol Use: No     Comment: quit at the time of stroke    Review of Systems  Respiratory: Positive for cough.   Neurological: Positive for syncope and weakness.  All other systems reviewed and are negative.    Allergies  Metformin  Home Medications   Current Outpatient Rx  Name  Route  Sig  Dispense  Refill  . BENICAR 40 MG tablet      TAKE ONE TABLET BY MOUTH EVERY DAY. PATIENT NEEDS TO SCHEDULE PHYSICAL EXAM FOR FURTHER REFILLS.   30 tablet   0   . BENICAR 40 MG tablet      TAKE ONE TABLET BY MOUTH ONCE DAILY   30 tablet   5   . carvedilol (COREG) 25 MG tablet      TAKE ONE-HALF TABLET BY MOUTH TWICE DAILY   30 tablet   5   . cloNIDine (CATAPRES) 0.2 MG tablet      TAKE ONE TABLET BY MOUTH TWICE DAILY   60 tablet   5   . finasteride (PROSCAR) 5 MG tablet   Oral   Take 1 tablet (5 mg total) by mouth daily.   30 tablet   prn   . furosemide (LASIX) 40 MG tablet      TAKE ONE TABLET BY MOUTH ONCE DAILY   30 tablet   5   . glucose blood  test strip      TEST TWO TIMES DAILY  AS DIRECTED   100 each   10     FREESTYLE LITE TEST STRIPS   . hydrALAZINE (APRESOLINE) 25 MG tablet      TAKE ONE TABLET BY MOUTH THREE TIMES DAILY   90 tablet   5   . hydrALAZINE (APRESOLINE) 25 MG tablet      TAKE ONE TABLET BY MOUTH THREE TIMES DAILY   90 tablet   0   . EXPIRED: insulin NPH (HUMULIN N,NOVOLIN N) 100 UNIT/ML injection   Subcutaneous   Inject 30 Units into the skin every morning.          Marland Kitchen KLOR-CON M20 20 MEQ tablet      TAKE ONE TABLET BY MOUTH TWICE DAILY   60 tablet   5   . NOVOLIN N RELION 100 UNIT/ML injection      INJECT 20 UNITS SUBCUTANEOUSLY AT BEDTIME AS DIRECTED   10 mL   5   . OVER THE COUNTER MEDICATION   Oral   Take 1 tablet by mouth 2 (two) times a week. Vegetable Laxative OTC         . sertraline (ZOLOFT) 50 MG tablet      TAKE ONE TABLET BY MOUTH EVERY DAY   30 tablet   0   . sertraline (ZOLOFT) 50 MG tablet      TAKE ONE TABLET BY MOUTH EVERY DAY   30 tablet   5   . sertraline (ZOLOFT) 50 MG tablet      TAKE ONE TABLET BY MOUTH EVERY DAY   30 tablet   0   . simvastatin (ZOCOR) 40 MG tablet      TAKE ONE TABLET BY MOUTH EVERY DAY AT BEDTIME   30 tablet   5   . tamsulosin (FLOMAX) 0.4 MG CAPS capsule      TAKE ONE CAPSULE BY MOUTH ONCE DAILY   30 capsule   5    BP 118/46  Pulse 63  Temp(Src) 98.7 F (37.1 C) (Oral)  Resp 14  Ht 5\' 8"  (1.727 m)  Wt 225 lb (102.059 kg)  BMI 34.22 kg/m2  SpO2 96%  Physical Exam  Nursing note and vitals reviewed. Constitutional: He is oriented to person, place, and time. He appears well-developed and well-nourished. No distress.  Chronically ill appearing  HENT:  Head: Normocephalic and atraumatic.  Mouth/Throat: Oropharynx is  clear and moist.  No tongue laceration; dentition intact; no visible signs of head trauma  Eyes: Conjunctivae and EOM are normal. Pupils are equal, round, and reactive to light.  Neck: Normal range  of motion.  Cardiovascular: Normal rate, regular rhythm and normal heart sounds.   Pulmonary/Chest: Effort normal. No respiratory distress. He has no wheezes. He has rhonchi.  Diffuse rhonchi  Abdominal: Soft. Bowel sounds are normal. There is no tenderness. There is no guarding.  Musculoskeletal: Normal range of motion. He exhibits edema.  Trace edema bilateral lower extremities  Neurological: He is alert and oriented to person, place, and time.  CN grossly intact; residual right sided deficit with limited movement of right upper and lower extremities; full motion of LUE and LLE; diffuse sensation to light touch intact diffusely; no facial droop appreciated  Skin: Skin is warm and dry. He is not diaphoretic.  Psychiatric: He has a normal mood and affect. His speech is normal.    ED Course  Procedures (including critical care time)   Date: 12/14/2012  Rate: 59  Rhythm: normal sinus rhythm  QRS Axis: normal  Intervals: PR prolonged  ST/T Wave abnormalities: normal  Conduction Disutrbances:right bundle branch block  Narrative Interpretation:   Old EKG Reviewed: unchanged   Labs Review Labs Reviewed  CBC WITH DIFFERENTIAL - Abnormal; Notable for the following:    RBC 2.86 (*)    Hemoglobin 9.2 (*)    HCT 28.2 (*)    All other components within normal limits  BASIC METABOLIC PANEL - Abnormal; Notable for the following:    Glucose, Bld 239 (*)    GFR calc non Af Amer 64 (*)    GFR calc Af Amer 75 (*)    All other components within normal limits  PRO B NATRIURETIC PEPTIDE - Abnormal; Notable for the following:    Pro B Natriuretic peptide (BNP) 1410.0 (*)    All other components within normal limits  URINALYSIS, ROUTINE W REFLEX MICROSCOPIC  CG4 I-STAT (LACTIC ACID)  POCT I-STAT TROPONIN I   Imaging Review Dg Chest 2 View  12/14/2012   CLINICAL DATA:  Syncope, cough, CHF.  EXAM: CHEST  2 VIEW  COMPARISON:  07/05/2009  FINDINGS: Possible mild patchy retrocardiac opacity, best  visualized on the lateral view, pneumonia not excluded. No pleural effusion or pneumothorax.  Mild cardiomegaly. Left subclavian ICD.  Degenerative changes of the visualized thoracolumbar spine.  IMPRESSION: Possible mild patchy retrocardiac/left lower lobe opacity, pneumonia not excluded.   Electronically Signed   By: Charline Bills M.D.   On: 12/14/2012 14:11   Ct Head Wo Contrast  12/14/2012   CLINICAL DATA:  Near syncope, history of seizure and CVA  EXAM: CT HEAD WITHOUT CONTRAST  TECHNIQUE: Contiguous axial images were obtained from the base of the skull through the vertex without intravenous contrast.  COMPARISON:  07/05/2009  FINDINGS: No evidence of parenchymal hemorrhage or extra-axial fluid collection. No mass lesion, mass effect, or midline shift.  No CT evidence of acute infarction. Old left basal ganglia infarct.  Extensive subcortical white matter and periventricular small vessel ischemic changes. Intracranial atherosclerosis.  Global cortical atrophy. No ventriculomegaly.  The visualized paranasal sinuses are essentially clear. The mastoid air cells are unopacified.  No evidence of calvarial fracture.  IMPRESSION: No evidence of acute intracranial abnormality.  Atrophy with small vessel ischemic changes and intracranial atherosclerosis.  Old left basal ganglia lacunar infarct   Electronically Signed   By: Charline Bills M.D.   On:  12/14/2012 13:56    EKG Interpretation   None       MDM   1. CAP (community acquired pneumonia)   2. Seizure   3. CEREBROVASCULAR ACCIDENT WITH RIGHT HEMIPARESIS   4. CONGESTIVE HEART FAILURE   5. CORONARY ARTERY DISEASE   6. HYPERTENSION, BENIGN ESSENTIAL   7. DM, UNCOMPLICATED, TYPE I, UNCONTROLLED    Patient alert and oriented, answering questions appropriately. MI bedside states he is returned to his baseline.  EKG normal sinus rhythm, right bundle branch block unchanged from previous.  CT head negative for acute findings.  CXR revealing LLL  pneumonia.  Labs largely WNL.  Pt given azithromycin and rocephin for CAP.  Consulted neurology, Dr. Leroy Kennedy, advised to load with Keppra in the ED initiate daily tx with 500mg  PO BID-- he will consult on case.  Consulted hospitalist, advised to obtain MRI, however pt has defibrillator in place.  Pt will be admitted to [facility], telemetry.  [employer]-- representative has interrogated device, no significant findings.  VS stable.  Garlon Hatchet, PA-C 12/14/12 2019

## 2012-12-14 NOTE — ED Notes (Signed)
Single chamber ICD check.  No episodes.  Normal device function.  12 years remaining on battery. V lead:  17.91mV, 659, 0.7v@0 .4ms, 48 ohm shock impedance [employer] [phone]

## 2012-12-14 NOTE — ED Notes (Signed)
Pt arrives to ed via [facility] for near syncopal episode 1 hr PTA. Pt sts wife witnessed "eyes rolling back" in pts head.  Pt hx of seizure 2 -3 years ago with right sided deficits-unable to raise arm..  Pt caox4, pmsx4, nad, no facial droop, no arm drift-left. Pt denies recent illness/injury.  Pt able to cough and stick out tongue.  Pt follows command appropriately.  Pt uses wheelchair at home to ambulate.

## 2012-12-14 NOTE — ED Notes (Signed)
CBG taken = 208

## 2012-12-14 NOTE — ED Notes (Signed)
Transporting patient to new room assignment. 

## 2012-12-14 NOTE — H&P (Addendum)
[facility] History and Physical  [name] JYN:[id number] DOB: 08/18/1940 DOA: 12/14/2012  Referring physician:  PCP: Illene Regulus, MD  Specialists:   Chief Complaint: syncope, seizure   HPI: [name] is a 72 y.o. male with PMH HTN, DM, CHF, cardiomyopathy s/cp ICD, CVA with R hemiplegia, h/o GIB presented with change in mental status; he was sitting upright in a chair, wife was in the kitchen and states she heard some gurgling noises. States she with him, his eyes were rolled in the back of his head" and he was very stiff, and his entire body began jerking. He was confused after the episodes;  -ED d/w neurology for possible seizure and reccommended to start keppra  He also reports SOB, with productive cough and found to have pneumonia in ED   Review of Systems: The patient denies anorexia, fever, weight loss,, vision loss, decreased hearing, hoarseness, chest pain, dyspnea on exertion, peripheral edema, balance deficits, hemoptysis, abdominal pain, melena, hematochezia, severe indigestion/heartburn, hematuria, incontinence, genital sores, muscle weakness, suspicious skin lesions, transient blindness,depression, unusual weight change, abnormal bleeding, enlarged lymph nodes, angioedema, and breast masses.    Past Medical History  Diagnosis Date  . 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 Automatic implantable cardiac defibrillator in situ   . Congestive heart failure, unspecified   . CVA (cerebral vascular accident)   . Retention of urine, unspecified   . Pneumonia, organism unspecified   . Cough   . Acute bronchitis   . Contusion of foot   . Other sign and symptom in breast   . Fibromyalgia   . Hyperlipidemia   . Coronary atherosclerosis of unspecified type of vessel, native or graft   . Esophageal reflux   . Type I (juvenile type) diabetes mellitus without mention of complication, uncontrolled   . Essential hypertension, benign   . Cocaine  abuse   . Alcohol abuse   . Microalbuminuria   . GERD (gastroesophageal reflux disease)   . Arteriovenous malformation (AVM)     of colon  . Diverticulosis   . GI bleed     secondary to avm   Past Surgical History  Procedure Laterality Date  . Back surgery      X 2  . Tonsillectomy    . Cardiac defibrillator placement     Social History:  reports that he has been smoking Cigarettes.  He has a 110 pack-year smoking history. He has never used smokeless tobacco. He reports that he does not drink alcohol or use illicit drugs. Home:  where does patient live--home, ALF, SNF? and with whom if at home? No:  Can patient participate in ADLs?  Allergies  Allergen Reactions  . Metformin Other (See Comments)    REACTION: diarrhea    Family History  Problem Relation Age of Onset  . Heart failure Mother   . Diabetes Sister   . Colon cancer Brother     questionable  . Prostate cancer Neg Hx     (be sure to complete)  Prior to Admission medications   Medication Sig Start Date End Date Taking? Authorizing Provider  BENICAR 40 MG tablet TAKE ONE TABLET BY MOUTH EVERY DAY. PATIENT NEEDS TO SCHEDULE PHYSICAL EXAM FOR FURTHER REFILLS. 04/27/12   Jacques Navy, MD  BENICAR 40 MG tablet TAKE ONE TABLET BY MOUTH ONCE DAILY 09/02/12   Jacques Navy, MD  carvedilol (COREG) 25 MG tablet TAKE ONE-HALF TABLET BY MOUTH TWICE DAILY 09/02/12  Jacques Navy, MD  cloNIDine (CATAPRES) 0.2 MG tablet TAKE ONE TABLET BY MOUTH TWICE DAILY 09/02/12   Jacques Navy, MD  finasteride (PROSCAR) 5 MG tablet Take 1 tablet (5 mg total) by mouth daily. 03/29/12   Jacques Navy, MD  furosemide (LASIX) 40 MG tablet TAKE ONE TABLET BY MOUTH ONCE DAILY 07/03/12   Jacques Navy, MD  glucose blood test strip TEST TWO TIMES DAILY  AS DIRECTED 07/16/12   Romero Belling, MD  hydrALAZINE (APRESOLINE) 25 MG tablet TAKE ONE TABLET BY MOUTH THREE TIMES DAILY 09/02/12   Jacques Navy, MD  hydrALAZINE (APRESOLINE) 25 MG  tablet TAKE ONE TABLET BY MOUTH THREE TIMES DAILY 10/02/12   Jacques Navy, MD  insulin NPH (HUMULIN N,NOVOLIN N) 100 UNIT/ML injection Inject 30 Units into the skin every morning.  04/10/11 04/09/12  Jacques Navy, MD  KLOR-CON M20 20 MEQ tablet TAKE ONE TABLET BY MOUTH TWICE DAILY 08/29/12   Jacques Navy, MD  NOVOLIN N RELION 100 UNIT/ML injection INJECT 20 UNITS SUBCUTANEOUSLY AT BEDTIME AS DIRECTED 08/02/12   Jacques Navy, MD  OVER THE COUNTER MEDICATION Take 1 tablet by mouth 2 (two) times a week. Vegetable Laxative OTC    Historical Provider, MD  sertraline (ZOLOFT) 50 MG tablet TAKE ONE TABLET BY MOUTH EVERY DAY 08/26/11   Jacques Navy, MD  sertraline (ZOLOFT) 50 MG tablet TAKE ONE TABLET BY MOUTH EVERY DAY 09/02/12   Jacques Navy, MD  sertraline (ZOLOFT) 50 MG tablet TAKE ONE TABLET BY MOUTH EVERY DAY 10/02/12   Jacques Navy, MD  simvastatin (ZOCOR) 40 MG tablet TAKE ONE TABLET BY MOUTH EVERY DAY AT BEDTIME 07/03/12   Jacques Navy, MD  tamsulosin (FLOMAX) 0.4 MG CAPS capsule TAKE ONE CAPSULE BY MOUTH ONCE DAILY 10/02/12   Jacques Navy, MD   Physical Exam: Filed Vitals:   12/14/12 1600  BP: 149/60  Pulse: 58  Temp:   Resp: 15     General:  Alert, but confused  Eyes: EOM-i  ENT: no oral ulcers   Neck: supple,   Cardiovascular: S1, s2 RRR  Respiratory: few crackles in LL  Abdomen: soft, nt ,nd   Skin: some ecchymosis   Musculoskeletal: R LE edema   Psychiatric: no hallucinations   Neurologic: CN 2-12 intact; R hemiplegia motor R LE 0/5   Labs on Admission:  Basic Metabolic Panel:  Recent Labs Lab 12/14/12 1331  NA 136  K 3.8  CL 100  CO2 27  GLUCOSE 239*  BUN 17  CREATININE 1.11  CALCIUM 8.7   Liver Function Tests: No results found for this basename: AST, ALT, ALKPHOS, BILITOT, PROT, ALBUMIN,  in the last 168 hours No results found for this basename: LIPASE, AMYLASE,  in the last 168 hours No results found for this basename:  AMMONIA,  in the last 168 hours CBC:  Recent Labs Lab 12/14/12 1331  WBC 8.6  NEUTROABS 6.4  HGB 9.2*  HCT 28.2*  MCV 98.6  PLT 225   Cardiac Enzymes: No results found for this basename: CKTOTAL, CKMB, CKMBINDEX, TROPONINI,  in the last 168 hours  BNP (last 3 results)  Recent Labs  12/14/12 1331  PROBNP 1410.0*   CBG: No results found for this basename: GLUCAP,  in the last 168 hours  Radiological Exams on Admission: Dg Chest 2 View  12/14/2012   CLINICAL DATA:  Syncope, cough, CHF.  EXAM: CHEST  2 VIEW  COMPARISON:  07/05/2009  FINDINGS: Possible mild patchy retrocardiac opacity, best visualized on the lateral view, pneumonia not excluded. No pleural effusion or pneumothorax.  Mild cardiomegaly. Left subclavian ICD.  Degenerative changes of the visualized thoracolumbar spine.  IMPRESSION: Possible mild patchy retrocardiac/left lower lobe opacity, pneumonia not excluded.   Electronically Signed   By: Charline Bills M.D.   On: 12/14/2012 14:11   Ct Head Wo Contrast  12/14/2012   CLINICAL DATA:  Near syncope, history of seizure and CVA  EXAM: CT HEAD WITHOUT CONTRAST  TECHNIQUE: Contiguous axial images were obtained from the base of the skull through the vertex without intravenous contrast.  COMPARISON:  07/05/2009  FINDINGS: No evidence of parenchymal hemorrhage or extra-axial fluid collection. No mass lesion, mass effect, or midline shift.  No CT evidence of acute infarction. Old left basal ganglia infarct.  Extensive subcortical white matter and periventricular small vessel ischemic changes. Intracranial atherosclerosis.  Global cortical atrophy. No ventriculomegaly.  The visualized paranasal sinuses are essentially clear. The mastoid air cells are unopacified.  No evidence of calvarial fracture.  IMPRESSION: No evidence of acute intracranial abnormality.  Atrophy with small vessel ischemic changes and intracranial atherosclerosis.  Old left basal ganglia lacunar infarct    Electronically Signed   By: Charline Bills M.D.   On: 12/14/2012 13:56    EKG: Independently reviewed. NSR, RBBB  Assessment/Plan Principal Problem:   Community acquired pneumonia Active Problems:   Seizure   HYPERTENSION, BENIGN ESSENTIAL   CORONARY ARTERY DISEASE   CONGESTIVE HEART FAILURE   72 y.o. male with PMH HTN, DM, CHF, cardiomyopathy s/cp ICD, CVA with R hemiplegia, h/o GIB presented with change in mental status is admitted with pneumonia, ? Seizure vs syncope   1. CAP; with productive cough/active smoker; CXR: Possible mild patchy retrocardiac/left lower lobe opacity;  -start IV atx, bronchodilators, oxygen; SLP r/o aspiration; stop smoking   2. Possible seizure vs syncope; started on keppra per neurology; CT head: old infacrt -obtain EEG, further imaging studies defer to neurology; monitor on tele; ICD check, UA to r/o UTI  3. Acute on chronic CHF systolic HF; s/p ICD; last echo (2011): LVEF 40% -resume diuretics; ICD check ordered by ED; daily weight, I/O; cont BB, ACE, added ASA  4. Anemia chronic; no s/s of acute bleeding; check iron panel;   5. 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 DM last HA1C 8.8 (2012) -recheck HA1C; cont insulin + ISS  7. HTN, cont home regimen   8. R LE edema; obtain US r/o DVT;   Neurology:  if consultant consulted, please document name and whether formally or informally consulted  Code Status: full (must indicate code status--if unknown or must be presumed, indicate so) Family Communication: family at the bedside, children  (indicate person spoken with, if applicable, with phone number if by telephone) Disposition Plan: home when ready  (indicate anticipated LOS)  Time spent: >35 minutes   Esperanza Sheets [facility] Pager [phone]  If 7PM-7AM, please contact  night-coverage [email or web address] Password [email or web address] 12/14/2012, 4:28 PM

## 2012-12-14 NOTE — Consult Note (Signed)
Reason for Consult: Syncope / Seizure Referring Physician: Dr Wilkie Aye  CC: Possible seizure  HPI: [name] is a 72 y.o. male with multiple medical problems as documented below. Today at approximately 11:30 AM the patient was sitting at the table with his wife when suddenly his eyes rolled back in his head and he began making a rhythmic noise with his mouth. The patient's wife was not sure whether he was trying to speak or if he was groaning. She immediately called 911. After about 5 minutes the episode ended; however, the patient appeared dazed, somewhat confused, and seemed unable to speak. He does not remember any details regarding the event and believes that he just fell asleep. By the time EMS arrived the patient was able to speak although he was still not back to baseline. He was brought to the emergency department where he was found to have pneumonia. A CT scan of the head showed an old left basal ganglia lacunar infarct but nothing acute.  The patient's wife stated that the patient did not demonstrate any type of jerking or abnormal movements during the episode. The only thing she witnessed was his eyes rolling back and the unusual sound he was making. There is a mention of a seizure history in a note from Dr. Pearlean Brownie from 2011; however, the patient's wife states that she was not aware of any previous seizure history. The patient did have a stroke 2-3 years ago with a subsequent right hemiplegia and dysphagia. He was instructed to use a thickening agent in his liquids although he does not follow these recommendations. His wife is concerned that he may have aspirated. Unfortunately he also continues to smoke tobacco and his urine drug screen was positive for cocaine. Currently the patient states that he feels fine.  Past Medical History  Diagnosis Date  . 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 Automatic implantable cardiac defibrillator in situ   . Congestive heart failure,  unspecified   . CVA (cerebral vascular accident)   . Retention of urine, unspecified   . Pneumonia, organism unspecified   . Cough   . Acute bronchitis   . Contusion of foot   . Other sign and symptom in breast   . Fibromyalgia   . Hyperlipidemia   . Coronary atherosclerosis of unspecified type of vessel, native or graft   . Esophageal reflux   . Type I (juvenile type) diabetes mellitus without mention of complication, uncontrolled   . Essential hypertension, benign   . Cocaine abuse   . Alcohol abuse   . Microalbuminuria   . GERD (gastroesophageal reflux disease)   . Arteriovenous malformation (AVM)     of colon  . Diverticulosis   . GI bleed     secondary to avm    Past Surgical History  Procedure Laterality Date  . Back surgery      X 2  . Tonsillectomy    . Cardiac defibrillator placement      Family History  Problem Relation Age of Onset  . Heart failure Mother   . Diabetes Sister   . Colon cancer Brother     questionable  . Prostate cancer Neg Hx     Social History:  reports that he has been smoking Cigarettes.  He has a 110 pack-year smoking history. He has never used smokeless tobacco. He reports that he does not drink alcohol or use illicit drugs.  Allergies  Allergen Reactions  . Metformin Other (See  Comments)    REACTION: diarrhea    Medications:  Current Facility-Administered Medications  Medication Dose Route Frequency Provider Last Rate Last Dose  . azithromycin (ZITHROMAX) 500 mg in dextrose 5 % 250 mL IVPB  500 mg Intravenous Once Garlon Hatchet, PA-C 250 mL/hr at 12/14/12 1600 500 mg at 12/14/12 1600  . HYDROmorphone (DILAUDID) injection 1 mg  1 mg Intravenous Q4H PRN Garlon Hatchet, PA-C      . levETIRAcetam (KEPPRA) 1,000 mg in sodium chloride 0.9 % 100 mL IVPB  1,000 mg Intravenous Once Wyatt Portela, MD      . Melene Muller ON 12/15/2012] levETIRAcetam (KEPPRA) 500 mg in sodium chloride 0.9 % 100 mL IVPB  500 mg Intravenous Q12H Wyatt Portela, MD       . ondansetron (ZOFRAN) injection 4 mg  4 mg Intravenous Q8H PRN Garlon Hatchet, PA-C       Current Outpatient Prescriptions  Medication Sig Dispense Refill  . BENICAR 40 MG tablet TAKE ONE TABLET BY MOUTH ONCE DAILY  30 tablet  5  . carvedilol (COREG) 25 MG tablet TAKE ONE-HALF TABLET BY MOUTH TWICE DAILY  30 tablet  5  . cloNIDine (CATAPRES) 0.2 MG tablet TAKE ONE TABLET BY MOUTH TWICE DAILY  60 tablet  5  . finasteride (PROSCAR) 5 MG tablet Take 1 tablet (5 mg total) by mouth daily.  30 tablet  prn  . furosemide (LASIX) 40 MG tablet TAKE ONE TABLET BY MOUTH ONCE DAILY  30 tablet  5  . hydrALAZINE (APRESOLINE) 25 MG tablet TAKE ONE TABLET BY MOUTH THREE TIMES DAILY  90 tablet  5  . insulin NPH (HUMULIN N,NOVOLIN N) 100 UNIT/ML injection Inject 30 Units into the skin daily before breakfast.      . KLOR-CON M20 20 MEQ tablet TAKE ONE TABLET BY MOUTH TWICE DAILY  60 tablet  5  . OVER THE COUNTER MEDICATION Take 1 tablet by mouth 2 (two) times a week. Vegetable Laxative OTC      . sertraline (ZOLOFT) 50 MG tablet TAKE ONE TABLET BY MOUTH EVERY DAY  30 tablet  0  . simvastatin (ZOCOR) 40 MG tablet TAKE ONE TABLET BY MOUTH EVERY DAY AT BEDTIME  30 tablet  5  . tamsulosin (FLOMAX) 0.4 MG CAPS capsule TAKE ONE CAPSULE BY MOUTH ONCE DAILY  30 capsule  5    ROS: History obtained from the patient and wife  General ROS: negative for - chills, fatigue, fever, night sweats, weight gain or weight loss Psychological ROS: negative for - behavioral disorder, hallucinations, memory difficulties, mood swings or suicidal ideation Ophthalmic ROS: negative for - blurry vision, double vision, eye pain or loss of vision ENT ROS: negative for - epistaxis, nasal discharge, oral lesions, sore throat, tinnitus or vertigo Allergy and Immunology ROS: negative for - hives or itchy/watery eyes Hematological and Lymphatic ROS: negative for - bleeding problems, bruising or swollen lymph nodes Endocrine ROS: negative  for - galactorrhea, hair pattern changes, polydipsia/polyuria or temperature intolerance Respiratory ROS: negative for - hemoptysis, shortness of breath or wheezing. Positive for recent cough and chronic dyspnea with ongoing tobacco use. Cardiovascular ROS: negative for - chest pain, dyspnea on exertion, or irregular heartbeat. Chronic lower extremity edema right greater than left. Gastrointestinal ROS: negative for - abdominal pain, diarrhea, hematemesis, nausea/vomiting or stool incontinence Genito-Urinary ROS: negative for - dysuria, hematuria, incontinence or urinary frequency/urgency Musculoskeletal ROS: negative for - joint swelling or muscular weakness. The patient is plegic on his right  side since his stroke and is unable to walk. Neurological ROS: as noted in HPI Dermatological ROS: negative for rash and skin lesion changes  Physical Examination: Blood pressure 149/60, pulse 58, temperature 97.6 F (36.4 C), temperature source Oral, resp. rate 15, height 5\' 8"  (1.727 m), weight 102.059 kg (225 lb), SpO2 99.00%.  General - large 72 year old male in no acute distress. Heart - Regular rate and rhythm - no murmer noted. Implanted defibrillator on the left side of his chest. Lungs - Clear to auscultation  anteriorly. Abdomen - Soft - non tender Extremities - Distal pulses weak but intact - 3+ pitting right lower extremity. 2+ pitting left lower extremity. Skin - Warm and dry   Neurologic Examination  Mental Status: Alert, oriented, thought content appropriate.  Speech fluent without evidence of aphasia.  Able to follow 3 step commands without difficulty. Cranial Nerves: II: Discs unable to visualize; Visual fields grossly normal, pupils equal, round, reactive to light and accommodation III,IV, VI: ptosis not present, extra-ocular motions intact bilaterally V,VII: smile symmetric, facial light touch sensation normal bilaterally VIII: hearing normal bilaterally IX,X: gag reflex  present XI: bilateral shoulder shrug XII: midline tongue extension Motor: Right : Upper extremity   2/5    Left:     Upper extremity   4/5  Lower extremity   1/5     Lower extremity   4/5 Tone and bulk:normal tone throughout; no atrophy noted Sensory: Pinprick and light touch intact throughout, bilaterally Deep Tendon Reflexes: 2+ and symmetric throughout Plantars: Right: downgoing   Left: downgoing Cerebellar: normal finger-to-nose with left upper extremity. Gait: Patient is nonambulatory  CV: pulses palpable throughout   Laboratory Studies:   Basic Metabolic Panel:  Recent Labs Lab 12/14/12 1331  NA 136  K 3.8  CL 100  CO2 27  GLUCOSE 239*  BUN 17  CREATININE 1.11  CALCIUM 8.7    Liver Function Tests: No results found for this basename: AST, ALT, ALKPHOS, BILITOT, PROT, ALBUMIN,  in the last 168 hours No results found for this basename: LIPASE, AMYLASE,  in the last 168 hours No results found for this basename: AMMONIA,  in the last 168 hours  CBC:  Recent Labs Lab 12/14/12 1331  WBC 8.6  NEUTROABS 6.4  HGB 9.2*  HCT 28.2*  MCV 98.6  PLT 225    Cardiac Enzymes: No results found for this basename: CKTOTAL, CKMB, CKMBINDEX, TROPONINI,  in the last 168 hours  BNP: No components found with this basename: POCBNP,   CBG: No results found for this basename: GLUCAP,  in the last 168 hours  Microbiology: Results for orders placed during the hospital encounter of 01/01/12  SURGICAL PCR SCREEN     Status: None   Collection Time    01/01/12  9:48 AM      Result Value Range Status   MRSA, PCR NEGATIVE  NEGATIVE Final   Staphylococcus aureus NEGATIVE  NEGATIVE Final   Comment:            The Xpert SA Assay (FDA     approved for NASAL specimens     in patients over 48 years of age),     is one component of     a comprehensive surveillance     program.  Test performance has     been validated by [employer] for patients greater     than or equal to 76  year old.     It is  not intended     to diagnose infection nor to     guide or monitor treatment.    Coagulation Studies: No results found for this basename: LABPROT, INR,  in the last 72 hours  Urinalysis: No results found for this basename: COLORURINE, APPERANCEUR, LABSPEC, PHURINE, GLUCOSEU, HGBUR, BILIRUBINUR, KETONESUR, PROTEINUR, UROBILINOGEN, NITRITE, LEUKOCYTESUR,  in the last 168 hours  Lipid Panel:     Component Value Date/Time   CHOL 123 07/15/2010 1625   TRIG 154.0* 07/15/2010 1625   HDL 33.20* 07/15/2010 1625   CHOLHDL 4 07/15/2010 1625   VLDL 30.8 07/15/2010 1625   LDLCALC 59 07/15/2010 1625    HgbA1C:  Lab Results  Component Value Date   HGBA1C 8.8* 07/15/2010    Urine Drug Screen:     Component Value Date/Time   LABOPIA NONE DETECTED 06/20/2009 1020   COCAINSCRNUR POSITIVE* 06/20/2009 1020   LABBENZ NONE DETECTED 06/20/2009 1020   AMPHETMU NONE DETECTED 06/20/2009 1020   THCU NONE DETECTED 06/20/2009 1020   LABBARB  Value: NONE DETECTED        DRUG SCREEN FOR MEDICAL PURPOSES ONLY.  IF CONFIRMATION IS NEEDED FOR ANY PURPOSE, NOTIFY LAB WITHIN 5 DAYS.        LOWEST DETECTABLE LIMITS FOR URINE DRUG SCREEN Drug Class       Cutoff (ng/mL) Amphetamine      1000 Barbiturate      200 Benzodiazepine   200 Tricyclics       300 Opiates          300 Cocaine          300 THC              50 06/20/2009 1020    Alcohol Level: No results found for this basename: ETH,  in the last 168 hours  Other results: EKG: Sinus rhythm rate 59 beats per minute  Imaging:  Dg Chest 2 View 12/14/2012    Possible mild patchy retrocardiac/left lower lobe opacity, pneumonia not excluded.      Ct Head Wo Contrast 12/14/2012    No evidence of acute intracranial abnormality.  Atrophy with small vessel ischemic changes and intracranial atherosclerosis.  Old left basal ganglia lacunar infarct      Assessment/Plan:  This is a 72 year old male with a very complicated past medical history as documented above  who was brought to the emergency department today after a possible seizure. He was found to have pneumonia. He has a history of a previous stroke with right hemiplegia and dysphagia. He has a remote history of alcohol abuse, ongoing tobacco use, and a urine drug screen positive for cocaine this admission. A CT scan of the head revealed an old left basal ganglia lacunar infarct but nothing acute. He has been started on Keppra with plans for an EEG tomorrow. He has an implantable defibrillator which precludes the possibility of an MRI. Recommend: Load with 1 gram IV keppra and maintain on 500 mg IV BID. EEG.  Delton See PA-C [facility] Pager [phone] 12/14/2012, 5:15 PM Patient seen and examined together with physician assistant and I concur with the assessment and plan.  Wyatt Portela, MD

## 2012-12-15 ENCOUNTER — Inpatient Hospital Stay (HOSPITAL_COMMUNITY): Payer: Medicare Other

## 2012-12-15 DIAGNOSIS — I509 Heart failure, unspecified: Secondary | ICD-10-CM

## 2012-12-15 DIAGNOSIS — I10 Essential (primary) hypertension: Secondary | ICD-10-CM

## 2012-12-15 DIAGNOSIS — N401 Enlarged prostate with lower urinary tract symptoms: Secondary | ICD-10-CM

## 2012-12-15 DIAGNOSIS — R609 Edema, unspecified: Secondary | ICD-10-CM

## 2012-12-15 DIAGNOSIS — I69959 Hemiplegia and hemiparesis following unspecified cerebrovascular disease affecting unspecified side: Secondary | ICD-10-CM

## 2012-12-15 DIAGNOSIS — E1065 Type 1 diabetes mellitus with hyperglycemia: Secondary | ICD-10-CM

## 2012-12-15 DIAGNOSIS — R339 Retention of urine, unspecified: Secondary | ICD-10-CM

## 2012-12-15 DIAGNOSIS — J189 Pneumonia, unspecified organism: Secondary | ICD-10-CM

## 2012-12-15 LAB — GLUCOSE, CAPILLARY
Glucose-Capillary: 74 mg/dL (ref 70–99)
Glucose-Capillary: 84 mg/dL (ref 70–99)

## 2012-12-15 LAB — FERRITIN: Ferritin: 245 ng/mL (ref 22–322)

## 2012-12-15 LAB — IRON AND TIBC
Iron: 72 ug/dL (ref 42–135)
Saturation Ratios: 26 % (ref 20–55)
TIBC: 279 ug/dL (ref 215–435)
UIBC: 207 ug/dL (ref 125–400)

## 2012-12-15 LAB — BASIC METABOLIC PANEL
BUN: 14 mg/dL (ref 6–23)
CO2: 28 mEq/L (ref 19–32)
Chloride: 103 mEq/L (ref 96–112)
Creatinine, Ser: 1.01 mg/dL (ref 0.50–1.35)
Glucose, Bld: 135 mg/dL — ABNORMAL HIGH (ref 70–99)
Potassium: 3.8 mEq/L (ref 3.5–5.1)
Sodium: 140 mEq/L (ref 135–145)

## 2012-12-15 LAB — CBC
HCT: 28.5 % — ABNORMAL LOW (ref 39.0–52.0)
Hemoglobin: 9.1 g/dL — ABNORMAL LOW (ref 13.0–17.0)
MCHC: 31.9 g/dL (ref 30.0–36.0)
MCV: 97.9 fL (ref 78.0–100.0)
RBC: 2.91 MIL/uL — ABNORMAL LOW (ref 4.22–5.81)
WBC: 6.5 10*3/uL (ref 4.0–10.5)

## 2012-12-15 LAB — HEMOGLOBIN A1C: Mean Plasma Glucose: 157 mg/dL — ABNORMAL HIGH (ref <117)

## 2012-12-15 NOTE — Progress Notes (Signed)
VASCULAR LAB PRELIMINARY  PRELIMINARY  PRELIMINARY  PRELIMINARY  Bilateral lower extremity venous Dopplers completed.    Preliminary report:  There is no DVT or SVT noted in the bilateral lower extremities.  Padraig Nhan, RVT 12/15/2012, 9:51 AM

## 2012-12-15 NOTE — Progress Notes (Signed)
NEURO HOSPITALIST PROGRESS NOTE   SUBJECTIVE:                                                                                                                        Eating. Offers no complains. No further events concerning for seizures reported. No new neurological developments. On keppra 500 mg BID OBJECTIVE:                                                                                                                           Vital signs in last 24 hours: Temp:  [97.6 F (36.4 C)-98.7 F (37.1 C)] 97.7 F (36.5 C) (11/09 0500) Pulse Rate:  [57-63] 57 (11/09 0500) Resp:  [14-20] 20 (11/09 0500) BP: (118-190)/(46-82) 133/46 mmHg (11/09 0905) SpO2:  [96 %-100 %] 100 % (11/09 0500) Weight:  [99 kg (218 lb 4.1 oz)-102.059 kg (225 lb)] 99 kg (218 lb 4.1 oz) (11/08 1755)  Intake/Output from previous day: 11/08 0701 - 11/09 0700 In: 615 [P.O.:100; IV Piggyback:515] Out: 1250 [Urine:1250] Intake/Output this shift: Total I/O In: 120 [P.O.:120] Out: -  Nutritional status: Carb Control  Past Medical History  Diagnosis Date  . 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 Automatic implantable cardiac defibrillator in situ   . Congestive heart failure, unspecified   . CVA (cerebral vascular accident)   . Retention of urine, unspecified   . Pneumonia, organism unspecified   . Cough   . Acute bronchitis   . Contusion of foot   . Other sign and symptom in breast   . Fibromyalgia   . Hyperlipidemia   . Coronary atherosclerosis of unspecified type of vessel, native or graft   . Esophageal reflux   . Type I (juvenile type) diabetes mellitus without mention of complication, uncontrolled   . Essential hypertension, benign   . Cocaine abuse   . Alcohol abuse   . Microalbuminuria   . GERD (gastroesophageal reflux disease)   . Arteriovenous malformation (AVM)     of colon  . Diverticulosis   . GI bleed     secondary to avm    Physical Examination:  Blood pressure 149/60, pulse 58, temperature 97.6 F (36.4 C), temperature source Oral, resp. rate 15,  height 5\' 8"  (1.727 m), weight 102.059 kg (225 lb), SpO2 99.00%.  General - large 72 year old male in no acute distress.  Heart - Regular rate and rhythm - no murmer noted. Implanted defibrillator on the left side of his chest.  Lungs - Clear to auscultation anteriorly.  Abdomen - Soft - non tender  Extremities - Distal pulses weak but intact - 3+ pitting right lower extremity. 2+ pitting left lower extremity.  Skin - Warm and dry  Neurologic Exam:  Mental Status:  Alert, oriented, thought content appropriate. Speech fluent without evidence of aphasia. Able to follow 3 step commands without difficulty.  Cranial Nerves:  II: Discs unable to visualize; Visual fields grossly normal, pupils equal, round, reactive to light and accommodation  III,IV, VI: ptosis not present, extra-ocular motions intact bilaterally  V,VII: smile symmetric, facial light touch sensation normal bilaterally  VIII: hearing normal bilaterally  IX,X: gag reflex present  XI: bilateral shoulder shrug  XII: midline tongue extension  Motor:  Right : Upper extremity 2/5 Left: Upper extremity 4/5  Lower extremity 1/5 Lower extremity 4/5  Tone and bulk:normal tone throughout; no atrophy noted  Sensory: Pinprick and light touch intact throughout, bilaterally  Deep Tendon Reflexes: 2+ and symmetric throughout  Plantars:  Right: downgoing Left: downgoing  Cerebellar:  normal finger-to-nose with left upper extremity.  Gait: Patient is nonambulatory  CV: pulses palpable throughout    Lab Results: Lab Results  Component Value Date/Time   CHOL 123 07/15/2010  4:25 PM   Lipid Panel No results found for this basename: CHOL, TRIG, HDL, CHOLHDL, VLDL, LDLCALC,  in the last 72 hours  Studies/Results: Dg Chest 2 View  12/15/2012   CLINICAL DATA:  Shortness of breath.  EXAM: CHEST  2 VIEW   COMPARISON:  12/14/2012  FINDINGS: There is a single lead cardiac pacer. The heart size is normal. There is left lower lobe airspace disease. There is no pleural effusion or pneumothorax. The osseous structures are unremarkable.  IMPRESSION: Left lower lobe airspace disease concerning for atelectasis versus pneumonia.   Electronically Signed   By: Elige Ko   On: 12/15/2012 11:24   Dg Chest 2 View  12/14/2012   CLINICAL DATA:  Syncope, cough, CHF.  EXAM: CHEST  2 VIEW  COMPARISON:  07/05/2009  FINDINGS: Possible mild patchy retrocardiac opacity, best visualized on the lateral view, pneumonia not excluded. No pleural effusion or pneumothorax.  Mild cardiomegaly. Left subclavian ICD.  Degenerative changes of the visualized thoracolumbar spine.  IMPRESSION: Possible mild patchy retrocardiac/left lower lobe opacity, pneumonia not excluded.   Electronically Signed   By: Charline Bills M.D.   On: 12/14/2012 14:11   Ct Head Wo Contrast  12/14/2012   CLINICAL DATA:  Near syncope, history of seizure and CVA  EXAM: CT HEAD WITHOUT CONTRAST  TECHNIQUE: Contiguous axial images were obtained from the base of the skull through the vertex without intravenous contrast.  COMPARISON:  07/05/2009  FINDINGS: No evidence of parenchymal hemorrhage or extra-axial fluid collection. No mass lesion, mass effect, or midline shift.  No CT evidence of acute infarction. Old left basal ganglia infarct.  Extensive subcortical white matter and periventricular small vessel ischemic changes. Intracranial atherosclerosis.  Global cortical atrophy. No ventriculomegaly.  The visualized paranasal sinuses are essentially clear. The mastoid air cells are unopacified.  No evidence of calvarial fracture.  IMPRESSION: No evidence of acute intracranial abnormality.  Atrophy with small vessel ischemic changes and intracranial atherosclerosis.  Old left basal ganglia lacunar infarct  Electronically Signed   By: Charline Bills M.D.   On:  12/14/2012 13:56    MEDICATIONS                                                                                                                       I have reviewed the patient's current medications.  ASSESSMENT/PLAN:                                                                                                           Probable new onset seizure, partial complex? Can not have MRI due to pacemaker. Continue keppra. Awaiting EEG. Will follow up.  Wyatt Portela, MD [facility] [phone]  12/15/2012, 12:29 PM

## 2012-12-15 NOTE — Progress Notes (Signed)
Pt up to chair today with Hunt[place] Dec, tolerated well, remains alert and oriented times 4, wife at bedside, NAD noted, will continue to monitor

## 2012-12-15 NOTE — ED Provider Notes (Signed)
Medical screening examination/treatment/procedure(s) were conducted as a shared visit with non-physician practitioner(s) and myself.  I personally evaluated the patient during the encounter.  EKG Interpretation     Ventricular Rate:  59 PR Interval:  254 QRS Duration: 160 QT Interval:  527 QTC Calculation: 522 R Axis:   -72 Text Interpretation:  Sinus rhythm Prolonged PR interval RBBB and LAFB No significant change since last tracing            Patient presents with near syncopal versus seizure episode from home. He was confused following an episode of unresponsiveness per the wife.  Currently patient is at his baseline and alert and oriented. His neurologic exam is at his baseline with residual right-sided deficits.  CT scan is unremarkable. Patient to be evaluated by neurology. Pacemaker interrogation without evidence of acute events.  Chest x-ray concerning for pneumonia.  Patient will be treated. Given complexity of patient's presentation and multiple medical problems, patient will be admitted to the hospitalist service for further management.  Shon Baton, MD 12/15/12 1058

## 2012-12-15 NOTE — Progress Notes (Signed)
Subjective: [name] had an event at home with a change in mental status: question of seizure vs syncope. His initial evaluation was negative for neuro findings, EEG is pending. He was loaded with Keppra for possible seizure. Incidentally he was diagnosed with CAP. He does admit to a cough and some sputum production. July '14 office note reviewed: aspiration was a concern and he is not adherent to aspiration precautions.  Today he is in no distress, denies pain and by his report never had LOC. He denies any respiratory distress.   Objective: Lab:  Recent Labs  12/14/12 1331 12/15/12 0433  WBC 8.6 6.5  NEUTROABS 6.4  --   HGB 9.2* 9.1*  HCT 28.2* 28.5*  MCV 98.6 97.9  PLT 225 245    Recent Labs  12/14/12 1331 12/15/12 0433  NA 136 140  K 3.8 3.8  CL 100 103  GLUCOSE 239* 135*  BUN 17 14  CREATININE 1.11 1.01  CALCIUM 8.7 8.4   Troponin I - 0.01; pBNP 1410 Lactic acid 1.62 - normal  Imaging: CT brain: IMPRESSION:  No evidence of acute intracranial abnormality.  Atrophy with small vessel ischemic changes and intracranial  atherosclerosis.  Old left basal ganglia lacunar infarct   CXR: IMPRESSION:  Possible mild patchy retrocardiac/left lower lobe opacity, pneumonia  not excluded.   Scheduled Meds: . aspirin EC  81 mg Oral Daily  . azithromycin  500 mg Intravenous Q24H  . carvedilol  12.5 mg Oral BID WC  . cefTRIAXone (ROCEPHIN)  IV  1 g Intravenous Q24H  . cloNIDine  0.1 mg Oral BID  . enoxaparin (LOVENOX) injection  40 mg Subcutaneous Q24H  . finasteride  5 mg Oral Daily  . furosemide  40 mg Intravenous BID  . hydrALAZINE  25 mg Oral Q8H  . insulin aspart  0-9 Units Subcutaneous TID WC  . insulin NPH  30 Units Subcutaneous QAC breakfast  . irbesartan  150 mg Oral Daily  . levETIRAcetam  500 mg Intravenous Q12H  . potassium chloride SA  20 mEq Oral Daily  . sertraline  50 mg Oral QHS  . simvastatin  40 mg Oral q1800  . sodium chloride  3 mL Intravenous  Q12H  . tamsulosin  0.4 mg Oral Daily   Continuous Infusions:  PRN Meds:.acetaminophen, acetaminophen, albuterol, ondansetron (ZOFRAN) IV, ondansetron   Physical Exam: Filed Vitals:   12/15/12 0905  BP: 133/46  Pulse: 57  Temp: 97.7  Resp: 20   Gen'l- overweight tatted AA man in no distress HEENT- C&S clear, PERRLA Cor 2+ radial pulse, RRR Pulm - no increased WOB, no cough, No SOB Abd- protruberant, BS+, Soft Neuro - awake and alert, speech clear.     Assessment/Plan: 1. Pulm/ID - not a definitive CXR, no fever, no leukocytosis, normal O2 saturation. He does have a known risk for aspiration per previous hospitalizations and speech evals.  Plan Continue present antibiotic through today  F/u CXR this PM  OK for reevaluation of swallow function  2. Neuro - per neuro note, patient report it is not clear if he had a seizure. There is mention  In old records of seizure. He also had a problem in the past w/ cocaine intoxication.  Plan Continue Keppra  EEG as ordered.  3. Acute on chronic systolic HF - last echo reviewed. Question of medication adherence.  Plan Continue present medications  F/u chest x-ray.  4. Anemia - chronic disease related. Iron panel is pending.  5. CVA -  h/o CVA. Agree with restarting ASA for prevention along with risk modification  6. DM - reviewed last office note July '14. He admits that he did not go to lab that day. A1C is ordered and pending.  CBG (last 3)   Recent Labs  12/14/12 1427 12/14/12 2147 12/15/12 0630  GLUCAP 208* 180* 134*      7. HTN-  BP Readings from Last 3 Encounters:  12/15/12 133/46  08/13/12 108/60  04/26/12 138/68   Plan Continue home medications  8. R LE edema - LE venous doppler prelminary report is negative for DVT    Ill[facility] (o) [phone]; (c) [phone] Call-grp - [facility]  Tele: [phone]  12/15/2012, 10:20 AM

## 2012-12-16 ENCOUNTER — Inpatient Hospital Stay (HOSPITAL_COMMUNITY): Payer: Medicare Other

## 2012-12-16 DIAGNOSIS — R569 Unspecified convulsions: Secondary | ICD-10-CM

## 2012-12-16 LAB — CBC
HCT: 31.3 % — ABNORMAL LOW (ref 39.0–52.0)
Hemoglobin: 10 g/dL — ABNORMAL LOW (ref 13.0–17.0)
MCH: 31.3 pg (ref 26.0–34.0)
MCHC: 31.9 g/dL (ref 30.0–36.0)
MCV: 97.8 fL (ref 78.0–100.0)
RDW: 13.4 % (ref 11.5–15.5)
WBC: 6.3 10*3/uL (ref 4.0–10.5)

## 2012-12-16 LAB — BASIC METABOLIC PANEL
BUN: 17 mg/dL (ref 6–23)
CO2: 30 mEq/L (ref 19–32)
Chloride: 99 mEq/L (ref 96–112)
Creatinine, Ser: 0.99 mg/dL (ref 0.50–1.35)
Potassium: 3.5 mEq/L (ref 3.5–5.1)

## 2012-12-16 LAB — GLUCOSE, CAPILLARY
Glucose-Capillary: 91 mg/dL (ref 70–99)
Glucose-Capillary: 102 mg/dL — ABNORMAL HIGH (ref 70–99)
Glucose-Capillary: 83 mg/dL (ref 70–99)

## 2012-12-16 NOTE — Progress Notes (Signed)
PM note: EEG done today is a normal study.  Plan for d/c home.  D/c dictated  #[id number]

## 2012-12-16 NOTE — Progress Notes (Signed)
Subjective: Awake and alert with no events to report. Reviewed Neuro note and nursing note.  Objective: Lab:  Recent Labs  12/14/12 1331 12/15/12 0433 12/16/12 0420  WBC 8.6 6.5 6.3  NEUTROABS 6.4  --   --   HGB 9.2* 9.1* 10.0*  HCT 28.2* 28.5* 31.3*  MCV 98.6 97.9 97.8  PLT 225 245 253    Recent Labs  12/14/12 1331 12/15/12 0433 12/16/12 0420  NA 136 140 141  K 3.8 3.8 3.5  CL 100 103 99  GLUCOSE 239* 135* 92  BUN 17 14 17   CREATININE 1.11 1.01 0.99  CALCIUM 8.7 8.4 8.9    Imaging: CXR 12/15/12:IMPRESSION:  Left lower lobe airspace disease concerning for atelectasis versus  pneumonia.  Scheduled Meds: . aspirin EC  81 mg Oral Daily  . azithromycin  500 mg Intravenous Q24H  . carvedilol  12.5 mg Oral BID WC  . cefTRIAXone (ROCEPHIN)  IV  1 g Intravenous Q24H  . cloNIDine  0.1 mg Oral BID  . enoxaparin (LOVENOX) injection  40 mg Subcutaneous Q24H  . finasteride  5 mg Oral Daily  . furosemide  40 mg Intravenous BID  . hydrALAZINE  25 mg Oral Q8H  . insulin aspart  0-9 Units Subcutaneous TID WC  . insulin NPH  30 Units Subcutaneous QAC breakfast  . irbesartan  150 mg Oral Daily  . levETIRAcetam  500 mg Intravenous Q12H  . potassium chloride SA  20 mEq Oral Daily  . sertraline  50 mg Oral QHS  . simvastatin  40 mg Oral q1800  . sodium chloride  3 mL Intravenous Q12H  . tamsulosin  0.4 mg Oral Daily   Continuous Infusions:  PRN Meds:.acetaminophen, acetaminophen, albuterol, ondansetron (ZOFRAN) IV, ondansetron   Physical Exam: Filed Vitals:   12/16/12 0607  BP: 169/54  Pulse: 51  Temp: 97.6 F (36.4 C)  Resp: 18   Gen'l - older man in no distress HEENT - nl Cor- RRR Pulm - normal respirations, no rales or wheezes, no increasted WOB Neuro - right hemiplegia      Assessment/Plan: 1. PUlm - ID - CXR not conclusive for PNA, no fever, normal WBC, no cough, normal O2/ Plan D/c/ antibiotics  2. Neuro - no events. For EEG today Plan Continue  Keppra  3. HF- stable w/o evidence of decomensation  6. DM -  CBG (last 3)   Recent Labs  12/15/12 2058 12/16/12 0210 12/16/12 0642  GLUCAP 84 91 102*    Lab Results  Component Value Date   HGBA1C 7.1* 12/15/2012    Dispo - hopefully home later this PM  [employer] [facility] (o) [phone]; (c) [phone] Call-grp - [facility]  Tele: [phone]  12/16/2012, 7:17 AM

## 2012-12-16 NOTE — Evaluation (Signed)
Clinical/Bedside Swallow Evaluation Patient Details  Name: [name] MRN: [id number] Date of Birth: Aug 03, 1940  Today's Date: 12/16/2012 Time: 3086-5784 SLP Time Calculation (min): 24 min  Past Medical History:  Past Medical History  Diagnosis Date  . 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 Automatic implantable cardiac defibrillator in situ   . Congestive heart failure, unspecified   . CVA (cerebral vascular accident)   . Retention of urine, unspecified   . Pneumonia, organism unspecified   . Cough   . Acute bronchitis   . Contusion of foot   . Other sign and symptom in breast   . Fibromyalgia   . Hyperlipidemia   . Coronary atherosclerosis of unspecified type of vessel, native or graft   . Esophageal reflux   . Type I (juvenile type) diabetes mellitus without mention of complication, uncontrolled   . Essential hypertension, benign   . Cocaine abuse   . Alcohol abuse   . Microalbuminuria   . GERD (gastroesophageal reflux disease)   . Arteriovenous malformation (AVM)     of colon  . Diverticulosis   . GI bleed     secondary to avm   Past Surgical History:  Past Surgical History  Procedure Laterality Date  . Back surgery      X 2  . Tonsillectomy    . Cardiac defibrillator placement     HPI:  [name] is a 72 y.o. male with PMH HTN, DM, CHF, cardiomyopathy s/cp ICD, CVA with R hemiplegia, h/o GIB presented with change in mental status; he was sitting upright in a chair, wife was in the kitchen and states she heard some gurgling noises. States she with him, his eyes were rolled in the back of his head" and he was very stiff, and his entire body began jerking. He was confused after the episodes;     Assessment / Plan / Recommendation Clinical Impression  Pt presents with multiple swallows with all consistencies tested, however does not present with any overt s/s of aspiration throughout challenging. Clinically, pt appears safe to tolerate  current diet with utilization of aspiration and reflux precautions. If MD is concerned re: silent aspiration given possible PNA per CXR, recommend to order MBS to objectively evaluate. However, based on clinical presentation will sign off at this time. Please reorder as appropriate.    Aspiration Risk  Mild    Diet Recommendation Regular;Thin liquid   Liquid Administration via: Cup;Straw;No straw Medication Administration: Whole meds with liquid (1 at a time) Supervision: Patient able to self feed;Intermittent supervision to cue for compensatory strategies Compensations: Slow rate;Small sips/bites;Follow solids with liquid Postural Changes and/or Swallow Maneuvers: Seated upright 90 degrees;Upright 30-60 min after meal    Other  Recommendations Oral Care Recommendations: Oral care BID   Follow Up Recommendations  None    Frequency and Duration        Pertinent Vitals/Pain N/A    SLP Swallow Goals  N/A   Swallow Study Prior Functional Status       General Date of Onset: 12/14/12 HPI: [name] is a 72 y.o. male with PMH HTN, DM, CHF, cardiomyopathy s/cp ICD, CVA with R hemiplegia, h/o GIB presented with change in mental status; he was sitting upright in a chair, wife was in the kitchen and states she heard some gurgling noises. States she with him, his eyes were rolled in the back of his head" and he was very stiff, and his entire body began  jerking. He was confused after the episodes;   Type of Study: Bedside swallow evaluation Previous Swallow Assessment: previous MBS in 06/2009; unable to view, however pt denies any h/o dysphagia or diet modifications Diet Prior to this Study: Regular;Thin liquids Temperature Spikes Noted: Yes (low grade (82F)) Respiratory Status: Nasal cannula (2L) History of Recent Intubation: No Behavior/Cognition: Alert;Cooperative;Pleasant mood Oral Cavity - Dentition: Poor condition;Missing dentition (top dentures not available) Self-Feeding  Abilities: Able to feed self;Needs assist Patient Positioning: Upright in bed Baseline Vocal Quality: Hoarse (mildly hoarse) Volitional Cough: Strong Volitional Swallow: Able to elicit    Oral/Motor/Sensory Function Overall Oral Motor/Sensory Function: Appears within functional limits for tasks assessed   Ice Chips Ice chips: Not tested   Thin Liquid Thin Liquid: Impaired Presentation: Cup;Self Fed;Straw Pharyngeal  Phase Impairments: Multiple swallows    Nectar Thick Nectar Thick Liquid: Not tested   Honey Thick Honey Thick Liquid: Not tested   Puree Puree: Impaired Presentation: Spoon;Self Fed Pharyngeal Phase Impairments: Multiple swallows   Solid   GO    Solid: Impaired Presentation: Self Fed Pharyngeal Phase Impairments: Multiple swallows       Maxcine Ham 12/16/2012,2:41 PM  Maxcine Ham, M.A. CCC-SLP [phone]

## 2012-12-16 NOTE — Procedures (Signed)
EEG report.  Brief clinical history: 72 years old male with new onset GTC seizure. On keppra.  Technique: this is a 17 channel routine scalp EEG performed at the bedside with bipolar and monopolar montages arranged in accordance to the international 10/20 system of electrode placement. One channel was dedicated to EKG recording.  The study was performed during wakefulness, drowsiness, and stage 2 sleep. No activating procedures performed.  Description: patient spent a very limited amount of time in the wakeful state, and the best background consisted of a medium amplitude, posterior dominant, poorly sustained, symmetric and reactive 8 Hz rhythm. Drowsiness demonstrated dropout of the alpha rhythm. Stage 2 sleep showed symmetric and synchronous sleep spindles without intermixed epileptiform discharges. No activating procedures performed. No focal or generalized epileptiform discharges noted.  No slowing seen.  EKG showed sinus rhythm.  Impression: this is a normal awake and asleep EEG. Please, be aware that a normal EEG does not exclude the possibility of epilepsy.  Clinical correlation is advised.  Wyatt Portela, MD

## 2012-12-16 NOTE — Progress Notes (Signed)
EEG Completed; Results Pending  

## 2012-12-17 ENCOUNTER — Encounter (HOSPITAL_COMMUNITY): Payer: Self-pay | Admitting: Emergency Medicine

## 2012-12-17 ENCOUNTER — Observation Stay (HOSPITAL_COMMUNITY)
Admission: EM | Admit: 2012-12-17 | Discharge: 2012-12-19 | Disposition: A | Discharge status: Home / Self Care | Payer: Medicare Other | Attending: Internal Medicine | Admitting: Internal Medicine

## 2012-12-17 ENCOUNTER — Emergency Department (HOSPITAL_COMMUNITY): Payer: Medicare Other

## 2012-12-17 DIAGNOSIS — F17200 Nicotine dependence, unspecified, uncomplicated: Secondary | ICD-10-CM | POA: Insufficient documentation

## 2012-12-17 DIAGNOSIS — R569 Unspecified convulsions: Secondary | ICD-10-CM

## 2012-12-17 DIAGNOSIS — E1065 Type 1 diabetes mellitus with hyperglycemia: Secondary | ICD-10-CM

## 2012-12-17 DIAGNOSIS — Z95810 Presence of automatic (implantable) cardiac defibrillator: Secondary | ICD-10-CM | POA: Insufficient documentation

## 2012-12-17 DIAGNOSIS — G9340 Encephalopathy, unspecified: Principal | ICD-10-CM | POA: Diagnosis present

## 2012-12-17 DIAGNOSIS — I428 Other cardiomyopathies: Secondary | ICD-10-CM | POA: Insufficient documentation

## 2012-12-17 DIAGNOSIS — E119 Type 2 diabetes mellitus without complications: Secondary | ICD-10-CM | POA: Diagnosis present

## 2012-12-17 DIAGNOSIS — I959 Hypotension, unspecified: Secondary | ICD-10-CM

## 2012-12-17 DIAGNOSIS — I5022 Chronic systolic (congestive) heart failure: Secondary | ICD-10-CM | POA: Diagnosis present

## 2012-12-17 DIAGNOSIS — J189 Pneumonia, unspecified organism: Secondary | ICD-10-CM

## 2012-12-17 DIAGNOSIS — R9401 Abnormal electroencephalogram [EEG]: Secondary | ICD-10-CM | POA: Insufficient documentation

## 2012-12-17 DIAGNOSIS — R32 Unspecified urinary incontinence: Secondary | ICD-10-CM | POA: Insufficient documentation

## 2012-12-17 DIAGNOSIS — Z794 Long term (current) use of insulin: Secondary | ICD-10-CM | POA: Insufficient documentation

## 2012-12-17 DIAGNOSIS — R918 Other nonspecific abnormal finding of lung field: Secondary | ICD-10-CM | POA: Insufficient documentation

## 2012-12-17 DIAGNOSIS — Z993 Dependence on wheelchair: Secondary | ICD-10-CM | POA: Insufficient documentation

## 2012-12-17 DIAGNOSIS — I509 Heart failure, unspecified: Secondary | ICD-10-CM | POA: Insufficient documentation

## 2012-12-17 DIAGNOSIS — I10 Essential (primary) hypertension: Secondary | ICD-10-CM | POA: Insufficient documentation

## 2012-12-17 DIAGNOSIS — I2510 Atherosclerotic heart disease of native coronary artery without angina pectoris: Secondary | ICD-10-CM | POA: Diagnosis present

## 2012-12-17 DIAGNOSIS — I69959 Hemiplegia and hemiparesis following unspecified cerebrovascular disease affecting unspecified side: Secondary | ICD-10-CM | POA: Insufficient documentation

## 2012-12-17 LAB — POCT I-STAT 3, ART BLOOD GAS (G3+)
Acid-Base Excess: 2 mmol/L (ref 0.0–2.0)
Bicarbonate: 27.8 mEq/L — ABNORMAL HIGH (ref 20.0–24.0)
O2 Saturation: 94 %
Patient temperature: 37
pCO2 arterial: 46.5 mmHg — ABNORMAL HIGH (ref 35.0–45.0)
pH, Arterial: 7.384 (ref 7.350–7.450)
pO2, Arterial: 74 mmHg — ABNORMAL LOW (ref 80.0–100.0)

## 2012-12-17 LAB — CBC WITH DIFFERENTIAL/PLATELET
Basophils Absolute: 0 10*3/uL (ref 0.0–0.1)
Basophils Relative: 0 % (ref 0–1)
Eosinophils Absolute: 0.1 10*3/uL (ref 0.0–0.7)
Eosinophils Relative: 2 % (ref 0–5)
Hemoglobin: 9.7 g/dL — ABNORMAL LOW (ref 13.0–17.0)
Lymphocytes Relative: 15 % (ref 12–46)
Lymphs Abs: 1 10*3/uL (ref 0.7–4.0)
MCH: 32.6 pg (ref 26.0–34.0)
MCV: 97.3 fL (ref 78.0–100.0)
Monocytes Relative: 9 % (ref 3–12)
Neutro Abs: 5 10*3/uL (ref 1.7–7.7)
Neutrophils Relative %: 74 % (ref 43–77)
Platelets: 246 10*3/uL (ref 150–400)
RBC: 2.98 MIL/uL — ABNORMAL LOW (ref 4.22–5.81)
RDW: 13.3 % (ref 11.5–15.5)
WBC: 6.7 10*3/uL (ref 4.0–10.5)

## 2012-12-17 LAB — URINALYSIS, ROUTINE W REFLEX MICROSCOPIC
Bilirubin Urine: NEGATIVE
Hgb urine dipstick: NEGATIVE
Ketones, ur: NEGATIVE mg/dL
Specific Gravity, Urine: 1.016 (ref 1.005–1.030)
Urobilinogen, UA: 0.2 mg/dL (ref 0.0–1.0)
pH: 6 (ref 5.0–8.0)

## 2012-12-17 LAB — COMPREHENSIVE METABOLIC PANEL
ALT: 7 U/L (ref 0–53)
Albumin: 3.1 g/dL — ABNORMAL LOW (ref 3.5–5.2)
Alkaline Phosphatase: 73 U/L (ref 39–117)
BUN: 20 mg/dL (ref 6–23)
CO2: 27 mEq/L (ref 19–32)
Calcium: 8.6 mg/dL (ref 8.4–10.5)
Chloride: 100 mEq/L (ref 96–112)
GFR calc Af Amer: 59 mL/min — ABNORMAL LOW (ref >90)
GFR calc non Af Amer: 51 mL/min — ABNORMAL LOW (ref >90)
Glucose, Bld: 240 mg/dL — ABNORMAL HIGH (ref 70–99)
Potassium: 3.9 mEq/L (ref 3.5–5.1)
Sodium: 138 mEq/L (ref 135–145)
Total Bilirubin: 0.2 mg/dL — ABNORMAL LOW (ref 0.3–1.2)
Total Protein: 7.5 g/dL (ref 6.0–8.3)

## 2012-12-17 LAB — PRO B NATRIURETIC PEPTIDE: Pro B Natriuretic peptide (BNP): 926.7 pg/mL — ABNORMAL HIGH (ref 0–125)

## 2012-12-17 LAB — POCT I-STAT TROPONIN I: Troponin i, poc: 0.01 ng/mL (ref 0.00–0.08)

## 2012-12-17 MED ORDER — HYDRALAZINE HCL 25 MG PO TABS
25.0000 mg | ORAL_TABLET | Freq: Four times a day (QID) | ORAL | Status: DC | PRN
  Administered 2012-12-18: 25 mg via ORAL
  Filled 2012-12-17 (×3): qty 1

## 2012-12-17 MED ORDER — SODIUM CHLORIDE 0.9 % IJ SOLN
3.0000 mL | Freq: Two times a day (BID) | INTRAMUSCULAR | Status: DC
  Administered 2012-12-17 – 2012-12-18 (×4): 3 mL via INTRAVENOUS

## 2012-12-17 MED ORDER — ACETAMINOPHEN 650 MG RE SUPP: 650.0000 mg | Freq: Four times a day (QID) | RECTAL | Status: DC | PRN

## 2012-12-17 MED ORDER — ONDANSETRON HCL 4 MG/2ML IJ SOLN: 4.0000 mg | Freq: Four times a day (QID) | INTRAMUSCULAR | Status: DC | PRN

## 2012-12-17 MED ORDER — ENOXAPARIN SODIUM 40 MG/0.4ML ~~LOC~~ SOLN
40.0000 mg | SUBCUTANEOUS | Status: DC
  Administered 2012-12-17 – 2012-12-18 (×2): 40 mg via SUBCUTANEOUS
  Filled 2012-12-17 (×3): qty 0.4

## 2012-12-17 MED ORDER — INSULIN ASPART 100 UNIT/ML ~~LOC~~ SOLN: 0.0000 [IU] | Freq: Three times a day (TID) | SUBCUTANEOUS | Status: DC

## 2012-12-17 MED ORDER — ONDANSETRON HCL 4 MG PO TABS: 4.0000 mg | ORAL_TABLET | Freq: Four times a day (QID) | ORAL | Status: DC | PRN

## 2012-12-17 MED ORDER — VANCOMYCIN HCL 10 G IV SOLR
1500.0000 mg | Freq: Once | INTRAVENOUS | Status: AC
  Administered 2012-12-17: 1500 mg via INTRAVENOUS
  Filled 2012-12-17: qty 1500

## 2012-12-17 MED ORDER — DEXTROSE 5 % IV SOLN
1.0000 g | INTRAVENOUS | Status: DC
  Administered 2012-12-18: 1 g via INTRAVENOUS
  Filled 2012-12-17 (×2): qty 10

## 2012-12-17 MED ORDER — SODIUM CHLORIDE 0.9 % IV BOLUS (SEPSIS)
1000.0000 mL | Freq: Once | INTRAVENOUS | Status: AC
  Administered 2012-12-17: 1000 mL via INTRAVENOUS

## 2012-12-17 MED ORDER — AZITHROMYCIN 500 MG PO TABS
500.0000 mg | ORAL_TABLET | Freq: Every day | ORAL | Status: DC
  Administered 2012-12-18: 10:00:00 500 mg via ORAL
  Filled 2012-12-17 (×2): qty 1

## 2012-12-17 MED ORDER — ACETAMINOPHEN 325 MG PO TABS: 650.0000 mg | ORAL_TABLET | Freq: Four times a day (QID) | ORAL | Status: DC | PRN

## 2012-12-17 MED ORDER — PIPERACILLIN-TAZOBACTAM 3.375 G IVPB
3.3750 g | Freq: Once | INTRAVENOUS | Status: AC
  Administered 2012-12-17: 3.375 g via INTRAVENOUS
  Filled 2012-12-17: qty 50

## 2012-12-17 MED ORDER — INSULIN NPH (HUMAN) (ISOPHANE) 100 UNIT/ML ~~LOC~~ SUSP
30.0000 [IU] | Freq: Every day | SUBCUTANEOUS | Status: DC
  Administered 2012-12-18 – 2012-12-19 (×2): 30 [IU] via SUBCUTANEOUS
  Filled 2012-12-17: qty 10

## 2012-12-17 NOTE — ED Notes (Signed)
Per pt's wife he is more alert than he was when she initially tried to wake him up from his nap. Reports that his baseline he normally has paralysis to right side of body but is normally A&Ox4.

## 2012-12-17 NOTE — ED Provider Notes (Signed)
CSN: [id number]     Arrival date & time 12/17/12  1203 History   First MD Initiated Contact with Patient 12/17/12 1215     Chief Complaint  Patient presents with  . Altered Mental Status   (Consider location/radiation/quality/duration/timing/severity/associated sxs/prior Treatment) HPI History obtained from patient, wife.   Pt d/c'd from hospital last night. Was normal this morning, ate breakfast, took a nap. Wife had difficulty waking him up, did not like his general appearance which including diaphoresis so called EMS. Onset was today.  The pain is currently none. Severe problem per wife. Modifying factors: none.  Associated symptoms: diaphoresis, no fever.  Recent medical care: EMS placed PIV they noted low blood pressure.   Past Medical History  Diagnosis Date  . 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 Automatic implantable cardiac defibrillator in situ   . Congestive heart failure, unspecified   . CVA (cerebral vascular accident)   . Retention of urine, unspecified   . Pneumonia, organism unspecified   . Cough   . Acute bronchitis   . Contusion of foot   . Other sign and symptom in breast   . Fibromyalgia   . Hyperlipidemia   . Coronary atherosclerosis of unspecified type of vessel, native or graft   . Esophageal reflux   . Type I (juvenile type) diabetes mellitus without mention of complication, uncontrolled   . Essential hypertension, benign   . Cocaine abuse   . Alcohol abuse   . Microalbuminuria   . GERD (gastroesophageal reflux disease)   . Arteriovenous malformation (AVM)     of colon  . Diverticulosis   . GI bleed     secondary to avm   Past Surgical History  Procedure Laterality Date  . Back surgery      X 2  . Tonsillectomy    . Cardiac defibrillator placement     Family History  Problem Relation Age of Onset  . Heart failure Mother   . Diabetes Sister   . Colon cancer Brother     questionable  . Prostate cancer Neg Hx     History  Substance Use Topics  . Smoking status: Current Every Day Smoker -- 2.00 packs/day for 55 years    Types: Cigarettes  . Smokeless tobacco: Never Used     Comment: down to 6 cigarettes a day  . Alcohol Use: No     Comment: quit at the time of stroke    Review of Systems Constitutional: Negative for fever.  Eyes: Negative for vision loss.  ENT: Negative for difficulty swallowing.  Cardiovascular: Negative for chest pain. Respiratory: Negative for respiratory distress.  Gastrointestinal:  Negative for vomiting.  Genitourinary: Negative for inability to void.  Musculoskeletal: Negative for gait problem.  Integumentary: Negative for rash.  Neurological: Negative for new focal weakness.     Allergies  Metformin  Home Medications   No current outpatient prescriptions on file. BP 173/67  Pulse 88  Temp(Src) 97.1 F (36.2 C) (Oral)  Resp 18  Ht 5\' 8"  (1.727 m)  Wt 204 lb 14.4 oz (92.942 kg)  BMI 31.16 kg/m2  SpO2 100% Physical Exam Nursing note and vitals reviewed.  Constitutional: Pt is alert and appears stated age. Eyes: No injection, no scleral icterus. HENT: Atraumatic, airway open without erythema or exudate.  Respiratory: No respiratory distress. Equal breathing bilaterally. Cardiovascular: Normal rate. Extremities warm and well perfused.  Abdomen: Soft, non-tender. MSK: Extremities are atraumatic without deformity. Skin: No rash, no  wounds.   Neuro: No motor nor sensory deficit.     ED Course  Procedures (including critical care time) Labs Review Labs Reviewed  CBC WITH DIFFERENTIAL - Abnormal; Notable for the following:    RBC 2.98 (*)    Hemoglobin 9.7 (*)    HCT 29.0 (*)    All other components within normal limits  COMPREHENSIVE METABOLIC PANEL - Abnormal; Notable for the following:    Glucose, Bld 240 (*)    Albumin 3.1 (*)    Total Bilirubin 0.2 (*)    GFR calc non Af Amer 51 (*)    GFR calc Af Amer 59 (*)    All other components  within normal limits  PRO B NATRIURETIC PEPTIDE - Abnormal; Notable for the following:    Pro B Natriuretic peptide (BNP) 926.7 (*)    All other components within normal limits  GLUCOSE, CAPILLARY - Abnormal; Notable for the following:    Glucose-Capillary 205 (*)    All other components within normal limits  BLOOD GAS, ARTERIAL - Abnormal; Notable for the following:    pCO2 arterial 46.8 (*)    Bicarbonate 28.6 (*)    Acid-Base Excess 4.2 (*)    All other components within normal limits  GLUCOSE, CAPILLARY - Abnormal; Notable for the following:    Glucose-Capillary 144 (*)    All other components within normal limits  GLUCOSE, CAPILLARY - Abnormal; Notable for the following:    Glucose-Capillary 112 (*)    All other components within normal limits  GLUCOSE, CAPILLARY - Abnormal; Notable for the following:    Glucose-Capillary 115 (*)    All other components within normal limits  POCT I-STAT 3, BLOOD GAS (G3+) - Abnormal; Notable for the following:    pCO2 arterial 46.5 (*)    pO2, Arterial 74.0 (*)    Bicarbonate 27.8 (*)    All other components within normal limits  CULTURE, BLOOD (ROUTINE X 2)  CULTURE, BLOOD (ROUTINE X 2)  URINE CULTURE  URINALYSIS, ROUTINE W REFLEX MICROSCOPIC  TROPONIN I  TROPONIN I  TROPONIN I  PROCALCITONIN  POCT I-STAT TROPONIN I  CG4 I-STAT (LACTIC ACID)   Imaging Review Dg Chest Portable 1 View  12/17/2012   CLINICAL DATA:  Altered mental status.  EXAM: PORTABLE CHEST - 1 VIEW  COMPARISON:  Chest x-ray 12/15/2012.  FINDINGS: Mediastinum and hilar structures are normal. New onset of right infrahilar and persistent left lower lobe infiltrates are present. Cardiomegaly with mild pulmonary vascular prominence. A component of CHF may be present. Cardiac pacer with lead tip projected over the right ventricle. Tiny left pleural effusion cannot be excluded. No pneumothorax.  IMPRESSION: 1. Findings most consistent with mild congestive heart failure.  Superimposed pneumonia in the lung bases cannot be excluded. 2. Cardiac pacer in stable position.   Electronically Signed   By: Maisie Fus  Register   On: 12/17/2012 13:13    EKG Interpretation     Ventricular Rate:  65 PR Interval:  252 QRS Duration: 158 QT Interval:  507 QTC Calculation: 527 R Axis:   -77 Text Interpretation:  Sinus rhythm Prolonged PR interval RBBB and LAFB No significant change was found            MDM   1. Hypotension   2. Acute encephalopathy   3. Community acquired pneumonia   72 y.o. male w/ PMHx of multiple medical problems including recent d/c from hospital presents w/ AMS that is now improved per wife, hypotension with SBP  in 61s on my evaluation. Chart reviewed. Given recently treated for pneumonia, will be concerned for severe sepsis and treat with abx, IVF.   EKG without ischemic changes. CXR with possible superimposed PNA. BNP elevated but less than previous. CBC, CMP unremarkable. Lactate wnl. Troponin neg. Blood pressure improved with fluid. Uncertain etiology of hypotension, may be medication induced but called hospitalist for admission.       I independently viewed, interpreted, and used in my medical decision making all ordered lab and imaging tests. Medical Decision Making discussed with ED attending Dr. Loretha Stapler.     Charm Barges, MD 12/18/12 [id number]

## 2012-12-17 NOTE — H&P (Addendum)
[facility] History and Physical  [name] ZOX:[id number] DOB: [birth year] DOA: 12/17/2012  Referring physician:ED PCP: Illene Regulus, MD   Chief Complaint:  Altered mental status x 1 day   HPI:  72 y.o. male with Past medical hx of  HTN, DM, CHF with cardiomyopathy s/p ICD, CVA with Rt hemiplegia, h/o GIB presented with change in mental status today. He was admitted few days back for AMS with concern for seizure and had neurological w/up including CT head and EEG which as negative. He was discharged home last evening. This morning after breakfast he went out to smoke and came inside to have his usual nap. His wife noticed him to be drowsy with drooling saliva and difficult to arouse for almost 10-12 minutes. He was sweaty as well. His fsg was in 150s. No seizure like activity. Patient incontinent with urine at baseline. No fall or tongue bite. Denies headache, blurred vision, nausea , vomiting, dizziness, chest pain, palpitations, SOB, diarrhea, fever chills. doe snot remember the incident.   During his recent admission he was started on keppra. Patient at baseline is  Wheelchair bound.  Course in the ED  patient noted to be hypotensive with SBP in 70s, GIVEN 1 LNS after which his BP improved. CXR suggested mild CHF with left basilar infiltrate seen on previous CXR. Given empiric IV vanco and zosyn and [facility] called for admission.   Review of Systems:  Constitutional: Denies fever, chills, diaphoresis, appetite change and fatigue.  HEENT: Denies photophobia, eye pain, redness, hearing loss, ear pain, congestion, sore throat, rhinorrhea, sneezing, mouth sores, trouble swallowing, neck pain, neck stiffness and tinnitus.   Respiratory: Denies SOB, DOE, cough, chest tightness,  and wheezing.   Cardiovascular: Denies chest pain, palpitations and leg swelling.  Gastrointestinal: Denies nausea, vomiting, abdominal pain, diarrhea, constipation, blood in stool and abdominal  distention.  Genitourinary: Denies dysuria, hematuria, flank pain, incontinent Endocrine: reports sweating,  polyuria, polydipsia. Musculoskeletal: Denies myalgias, back pain, joint swelling, arthralgias  Neurological:   Denies dizziness, seizures, weakness, light-headedness, numbness and headaches.  Psychiatric/Behavioral:  Confusion, Denies suicidal ideation, mood changes, nervousness, sleep disturbance and agitation   Past Medical History  Diagnosis Date  . 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 Automatic implantable cardiac defibrillator in situ   . Congestive heart failure, unspecified   . CVA (cerebral vascular accident)   . Retention of urine, unspecified   . Pneumonia, organism unspecified   . Cough   . Acute bronchitis   . Contusion of foot   . Other sign and symptom in breast   . Fibromyalgia   . Hyperlipidemia   . Coronary atherosclerosis of unspecified type of vessel, native or graft   . Esophageal reflux   . Type I (juvenile type) diabetes mellitus without mention of complication, uncontrolled   . Essential hypertension, benign   . Cocaine abuse   . Alcohol abuse   . Microalbuminuria   . GERD (gastroesophageal reflux disease)   . Arteriovenous malformation (AVM)     of colon  . Diverticulosis   . GI bleed     secondary to avm   Past Surgical History  Procedure Laterality Date  . Back surgery      X 2  . Tonsillectomy    . Cardiac defibrillator placement     Social History:  reports that he has been smoking Cigarettes.  He has a 110 pack-year smoking history. He has never used smokeless tobacco. He reports that  he does not drink alcohol or use illicit drugs.  Allergies  Allergen Reactions  . Metformin Other (See Comments)    REACTION: diarrhea    Family History  Problem Relation Age of Onset  . Heart failure Mother   . Diabetes Sister   . Colon cancer Brother     questionable  . Prostate cancer Neg Hx     Prior to Admission  medications   Medication Sig Start Date End Date Taking? Authorizing Provider  carvedilol (COREG) 25 MG tablet Take 12.5 mg by mouth 2 (two) times daily with a meal.   Yes Historical Provider, MD  cloNIDine (CATAPRES) 0.2 MG tablet Take 0.2 mg by mouth 2 (two) times daily.   Yes Historical Provider, MD  finasteride (PROSCAR) 5 MG tablet Take 1 tablet (5 mg total) by mouth daily. 03/29/12  Yes Jacques Navy, MD  furosemide (LASIX) 40 MG tablet Take 40 mg by mouth.   Yes Historical Provider, MD  hydrALAZINE (APRESOLINE) 25 MG tablet Take 25 mg by mouth 3 (three) times daily.   Yes Historical Provider, MD  insulin NPH (HUMULIN N,NOVOLIN N) 100 UNIT/ML injection Inject 30 Units into the skin daily before breakfast.   Yes Historical Provider, MD  olmesartan (BENICAR) 40 MG tablet Take 40 mg by mouth daily.   Yes Historical Provider, MD  OVER THE COUNTER MEDICATION Take 1 tablet by mouth 2 (two) times a week. Vegetable Laxative OTC   Yes Historical Provider, MD  potassium chloride SA (K-DUR,KLOR-CON) 20 MEQ tablet Take 20 mEq by mouth 2 (two) times daily.   Yes Historical Provider, MD  sertraline (ZOLOFT) 50 MG tablet Take 50 mg by mouth daily.   Yes Historical Provider, MD  simvastatin (ZOCOR) 40 MG tablet Take 40 mg by mouth daily.   Yes Historical Provider, MD  tamsulosin (FLOMAX) 0.4 MG CAPS capsule Take 0.4 mg by mouth daily.   Yes Historical Provider, MD    Physical Exam:  Filed Vitals:   12/17/12 1430 12/17/12 1445 12/17/12 1503 12/17/12 1616  BP: 125/45 125/40 120/61 150/62  Pulse: 54 52 57 57  Temp:    97.7 F (36.5 C)  TempSrc:    Oral  Resp: 10 11 18 20   Height:    5\' 8"  (1.727 m)  Weight:    94.53 kg (208 lb 6.4 oz)  SpO2: 96% 97% 99% 97%    Constitutional: Vital signs reviewed.  Patient is an elderly male lying in bed in NAD HEENT: no pallor, pupils reactive b/l, moist mucosa Cardiovascular: RRR, S1 normal, S2 normal, no MRG, Pulmonary/Chest: coarse breath sounds b/l, no  added sounds Abdominal: Soft. Non-tender, non-distended, bowel sounds are normal, no masses, organomegaly, or guarding present.  Ext: warm, no edema  CNS: AAOX3, CN2-12 intact. Right hemiparesis  Labs on Admission:  Basic Metabolic Panel:  Recent Labs Lab 12/14/12 1331 12/15/12 0433 12/16/12 0420 12/17/12 1227  NA 136 140 141 138  K 3.8 3.8 3.5 3.9  CL 100 103 99 100  CO2 27 28 30 27   GLUCOSE 239* 135* 92 240*  BUN 17 14 17 20   CREATININE 1.11 1.01 0.99 1.34  CALCIUM 8.7 8.4 8.9 8.6   Liver Function Tests:  Recent Labs Lab 12/17/12 1227  AST 14  ALT 7  ALKPHOS 73  BILITOT 0.2*  PROT 7.5  ALBUMIN 3.1*   No results found for this basename: LIPASE, AMYLASE,  in the last 168 hours No results found for this basename: AMMONIA,  in  the last 168 hours CBC:  Recent Labs Lab 12/14/12 1331 12/15/12 0433 12/16/12 0420 12/17/12 1227  WBC 8.6 6.5 6.3 6.7  NEUTROABS 6.4  --   --  5.0  HGB 9.2* 9.1* 10.0* 9.7*  HCT 28.2* 28.5* 31.3* 29.0*  MCV 98.6 97.9 97.8 97.3  PLT 225 245 253 246   Cardiac Enzymes: No results found for this basename: CKTOTAL, CKMB, CKMBINDEX, TROPONINI,  in the last 168 hours BNP: No components found with this basename: POCBNP,  CBG:  Recent Labs Lab 12/16/12 0210 12/16/12 0642 12/16/12 1131 12/16/12 1611 12/17/12 1622  GLUCAP 91 102* 127* 83 205*    Radiological Exams on Admission: Dg Chest Portable 1 View  12/17/2012   CLINICAL DATA:  Altered mental status.  EXAM: PORTABLE CHEST - 1 VIEW  COMPARISON:  Chest x-ray 12/15/2012.  FINDINGS: Mediastinum and hilar structures are normal. New onset of right infrahilar and persistent left lower lobe infiltrates are present. Cardiomegaly with mild pulmonary vascular prominence. A component of CHF may be present. Cardiac pacer with lead tip projected over the right ventricle. Tiny left pleural effusion cannot be excluded. No pneumothorax.  IMPRESSION: 1. Findings most consistent with mild congestive  heart failure. Superimposed pneumonia in the lung bases cannot be excluded. 2. Cardiac pacer in stable position.   Electronically Signed   By: Maisie Fus  Register   On: 12/17/2012 13:13    EKG: NSR , RBBB with LAFB, unchanged  Assessment/Plan  Principle problem Acute encephalopathy Admit to telemetry under observation hypotensive on presentation. Patient may have had a syncopal episode. Also noted for mild hypercarbia on ABG.  He has had recent neurological w/up for seizure like activity which was unremarkable. Patient on multiple different medications for his BP. i will hold them all and place him on prn hydralazine. Other BP meds need to be reintroduced slowly. Check orthostasis lying and sitting up. Serial Cardiac enzymes Will obtain 2 D echo UA unremarkable.  CXR again suggests left lung base infiltrate. Taken off abx on recent admission. Will place him empirically on IV rocephin and azithro -monitor o2 sat  ? OSA -patient may benefit from sleep study as outpt  Active Problems: DM Monitor fsg. continue home dose insulin ad SSI  Hypertension  on multiple BP meds which have been held    CAD continue statin. Not on ASA ??  Hx of CHF , hx of ICD clinically appears euvolemic. Elevated pro BNP. Check 2 D echo   CVA with right hemiparesis  continue statin. Wheelchair bound.     Seizure Recent EEG negative  continue keppra  Tobacco abuse  counseled on cessation  DVT prophylaxis: sq lovenox  Diet: diabetic    Code Status: full code Family Communication: wife at bedside Disposition Plan: home once stable  Eddie North [facility] Pager [phone]  If 7PM-7AM, please contact night-coverage [email or web address] Password [facility] 12/17/2012, 4:37 PM    Total time spent: 55 minutes  Dr Arthur Holms will resume care in am. Voice mail left.

## 2012-12-17 NOTE — Discharge Summary (Signed)
NAME:  [name], [name] NO.:  [id number]  MEDICAL RECORD NO.:  [id number]  LOCATION:  4E12C                        FACILITY:  [facility]  PHYSICIAN:  Rosalyn Gess. Norins, MD  DATE OF BIRTH:  Apr 24, 1940  DATE OF ADMISSION:  12/14/2012 DATE OF DISCHARGE:  12/16/2012                              DISCHARGE SUMMARY   ADMITTING DIAGNOSES: 1. Community-acquired pneumonia. 2. Seizure. 3. Acute on chronic congestive heart failure. 4. Chronic anemia. 5. History of cerebrovascular accident with right hemiplegia. 6. Diabetes. 7. Hypertension. 8. Right lower extremity edema.  DISCHARGE DIAGNOSES: 1. Atelectasis. 2. Syncopal episode. 3. Stable chronic congestive heart failure. 4. Chronic anemia. 5. Stable status post cerebrovascular accident with right hemiplegia. 6. Diabetes, well controlled. 7. Hypertension, stable. 8. Right lower extremity edema without evidence of deep venous     thrombosis.  PROCEDURES/IMAGING: 1. CT of the head without contrast, December 14, 2012, which showed no     evidence of acute intracranial abnormality.  Atrophy with small     vessel ischemic changes and intracranial atherosclerosis.  Old left     basal ganglia lacunar infarct. 2. Chest x-ray two-view, December 14, 2012, read out as possible mild     patchy retrocardiac left lower lobe opacity, pneumonia is not     excluded. 3. Chest x-ray on December 15, 2012, which showed left lower lobe     airspace disease concerning for atelectasis versus pneumonia. 4. EEG performed on December 16, 2012, which showed normal awake and     asleep EEG.  HISTORY OF PRESENT ILLNESS:  [name] is a 72 year old gentleman followed for hypertension, diabetes, heart failure, cardiomyopathy status post ICD implantation, CVA with right hemiplegia who presented to the emergency department after his wife found him in the kitchen with gurgling noises and decreased level of consciousness.  By her report, his eyes  rolled into the back of his head and he was very stiff and his entire body began jerking.  He was mildly confused after these episodes. He was brought to the emergency department for evaluation with a question of possible seizure disorder.  The patient was seen in consultation by Dr. Salli Real on the day of admission.  By Dr. Despina Hick history, he reports wife noted that the husband rolled his eyes back in his head, began making rhythmic noise with his mouth.  The patient's wife was not sure whether he was trying to speak or groaning.  She merely called 911.  After about 5 minutes, the episode ended.  The patient appeared dazed and somewhat confused and unable to speak.  He himself does not remember any of the details of the event and believes he just fell asleep.  By the time EMS arrived, the patient was able to speak though still not back to baseline.  It was noted by Dr. Salli Real, the patient did have a positive screen for cocaine on urine drug screen. He did recommend loading with Keppra and following up with an EEG.  Please see the H and P for past medical history, family history, and social history.  HOSPITAL COURSE: 1. Pulmonary/ID.  The patient did have an equivocal chest x-ray with  atelectasis versus infiltrative disease.  The patient never had any     fever.  He had a normal white blood count with normal differential.     No cough, no sputum production, and normal O2.  After 36 hours, the     patient's antibiotics were discontinued with no obvious sign of     infection.  On examination at discharge, lungs did remain clear     with no indication for resuming antibiotics. 2. Neuro.  The patient had no events since his admission.  He did have     an EEG performed on the day of discharge, which was read out as     normal sleep and awake study.  At this point, with no prior history     of epilepsy with an unclear history of syncope versus true seizure     activity, he will be sent  home without anticonvulsive medication. 3. Heart failure.  The patient did have a mildly elevated BNP at the     time of admission at 1410.  Cardiac enzymes were negative.  There     was no clinical correlation to suggest pulmonary edema, and chest x-     rays were negative for pulmonary edema.  This problem is considered     stable. 4. Diabetes.  The patient is followed for diabetes.  His last     hemoglobin A1c performed on December 15, 2012, was 7.1% indicating     adequate control on his present regimen. 5. Hypertension.  The patient's blood pressure remained stable during     his hospitalization, and he will continue on his home medications. 6. Right lower extremity edema.  The patient did have a venous Doppler     performed, which was negative for any signs of DVT and suspect this     is a problem with venous insufficiency. 7. CVA.  The patient has been stable with no change in his condition.     The patient was seen this admission by Speech Pathology who felt     the patient was safe for regular diet with thin liquids. 8. Substance abuse.  The patient did have a positive drug screen for     cocaine.  This had been a problem for him in the past.  He     adamantly denies using any drugs.  Discussed this with the patient     and his wife and instructed the patient moving forward that he     should avoid stimulants such as cocaine because of the adverse     effects on cardiovascular and neurologic conditions.  With the patient being medically stable with no signs of active infection with no recurrent neurologic events and the negative EEG, he is ready for discharge to home.  DISCHARGE PHYSICAL EXAMINATION:  VITAL SIGNS:  Temperature was 99, blood pressure was 138/59, heart rate was 58, respirations 18, oxygen saturation was 100%. GENERAL APPEARANCE:  This is an older African American gentleman in no acute distress. HEENT:  Normocephalic, atraumatic.  Conjunctivae and sclerae were  muddy. Pupils were equal, round, and reactive.  Oropharynx with poor dentition. NECK:  Supple. CARDIOVASCULAR:  2+ radial pulses.  Precordium was quiet.  His heart sounds were distant, but regular.  There was no JVD or carotid bruit. PULMONARY:  The patient has got normal respirations with no increased work of breathing.  No wheezes or rales were appreciated. ABDOMEN:  Soft.  No guarding or rebound.  No organomegaly  or splenomegaly was noted. EXTREMITIES:  The patient has mild edema of the right lower extremity, otherwise his extremities appear normal. DERM:  The patient has what appears to be excoriations on his right arm. He has multiple tattoos. NEURO:  The patient is awake, he is alert.  He is at his baseline.  In terms of cognitive function, he still has right hemiplegia.  The patient was without other neurologic findings.  FINAL LABORATORY DATA:  From the day of discharge, chemistry panel with a sodium of 141, potassium 3.5, chloride 99, CO2 of 30, BUN of 17, creatinine 0.99, glucose was 92.  Iron panel from December 15, 2012, with an iron of 72, TIBC 279.  Iron percent saturation is 26%.  Ferritin was normal at 245.  DISCHARGE MEDICATIONS:  The patient will be resuming his home medications as follows: 1. Benicar 40 mg daily. 2. Carvedilol 12.5 mg b.i.d. 3. Clonidine 0.2 mg one tablet b.i.d. 4. Proscar 5 mg once daily. 5. Furosemide 40 mg once daily. 6. Hydralazine 25 mg t.i.d. 7. NPH 30 units before breakfast. 8. Potassium 20 mEq daily. 9. Vegetable laxative over the counter daily as needed. 10.Sertraline 50 mg daily. 11.Zocor 40 mg daily. 12.Tamsulosin 0.4 mg daily.  DISPOSITION:  The patient is discharged home.  He will continue with his home medications as noted, his home regimen as noted.  His wife is his primary caretaker.  FOLLOWUP:  The patient will be contacted with an office appointment in the next 7-10 days.  The patient's condition at the time of  discharge dictation is medically stable.     Rosalyn Gess Norins, MD     MEN/MEDQ  D:  12/16/2012  T:  12/17/2012  Job:  [id number]

## 2012-12-17 NOTE — ED Notes (Signed)
Port xray at bedside.  

## 2012-12-17 NOTE — Progress Notes (Signed)
The patient was discharged at 2000.  His IV was removed and discharge teaching was completed with the patient's wife.  The charge nurse Morrie Sheldon and the nurse tech Lowella Bandy helped the patient into a Zenaida Niece.

## 2012-12-17 NOTE — ED Notes (Signed)
Per EMS - at 1030 he ate something then took a nap(like he normally does), then at 1200 they woke him up to have lunch and they couldn't arouse him so they called EMS. Normally pt is more alert but not very verbal, hx of stroke with right sided deficits. CBG 295, hx of DM. BP 91/56. HR 60 right bundle branch block, has ICD. With ems no right sided movement. Was here last night and just released was dx with pneumonia. EMS started 18G in left AC.

## 2012-12-17 NOTE — Progress Notes (Addendum)
Pt rec for ED. Pt o3x, no complaints of pain or sob. Pt hx of SZ suctions set up in room. Will continue to monitor

## 2012-12-17 NOTE — ED Provider Notes (Addendum)
I saw and evaluated the patient, reviewed the resident's note and I agree with the findings and plan.  EKG Interpretation     Ventricular Rate:  65 PR Interval:  252 QRS Duration: 158 QT Interval:  507 QTC Calculation: 527 R Axis:   -77 Text Interpretation:  Sinus rhythm Prolonged PR interval RBBB and LAFB No significant change was found            72 yo male with hypoxia, hypotension, and confusion.  Just discharged from hospital with CAP.  On exam, confused, no respiratory distress on oxygen, clear anterior lung sounds, heart sounds normal, RRR.  Hypotension improved with IV fluid hydration.  Admit to medicine.   Clinical Impression: 1. Hypotension   2. Acute encephalopathy   3. Community acquired pneumonia       Candyce Churn, MD 12/17/12 1928  Candyce Churn, MD 12/17/12 1929

## 2012-12-17 NOTE — ED Notes (Signed)
RT at bedside to stick pt

## 2012-12-17 NOTE — ED Notes (Signed)
Report given to Angus Seller, RN. Pt to be moved to POD C pending admission.

## 2012-12-17 NOTE — ED Notes (Signed)
phlebotomy will collect 2nd set of bld cultures.

## 2012-12-18 ENCOUNTER — Observation Stay (HOSPITAL_COMMUNITY): Payer: Medicare Other

## 2012-12-18 DIAGNOSIS — R569 Unspecified convulsions: Secondary | ICD-10-CM

## 2012-12-18 LAB — BLOOD GAS, ARTERIAL
Bicarbonate: 28.6 mEq/L — ABNORMAL HIGH (ref 20.0–24.0)
Patient temperature: 98.6
pH, Arterial: 7.404 (ref 7.350–7.450)
pO2, Arterial: 82.4 mmHg (ref 80.0–100.0)

## 2012-12-18 LAB — GLUCOSE, CAPILLARY
Glucose-Capillary: 112 mg/dL — ABNORMAL HIGH (ref 70–99)
Glucose-Capillary: 68 mg/dL — ABNORMAL LOW (ref 70–99)

## 2012-12-18 LAB — TROPONIN I
Troponin I: <0.3 ng/mL (ref <0.30)
Troponin I: <0.3 ng/mL (ref <0.30)

## 2012-12-18 LAB — URINE CULTURE: Culture: NO GROWTH

## 2012-12-18 LAB — PROCALCITONIN: Procalcitonin: <0.1 ng/mL

## 2012-12-18 MED ORDER — CARVEDILOL 12.5 MG PO TABS
12.5000 mg | ORAL_TABLET | Freq: Two times a day (BID) | ORAL | Status: DC
  Administered 2012-12-18 – 2012-12-19 (×3): 12.5 mg via ORAL
  Filled 2012-12-18 (×5): qty 1

## 2012-12-18 MED ORDER — IRBESARTAN 300 MG PO TABS
300.0000 mg | ORAL_TABLET | Freq: Every day | ORAL | Status: DC
  Administered 2012-12-18 – 2012-12-19 (×2): 300 mg via ORAL
  Filled 2012-12-18 (×2): qty 1

## 2012-12-18 MED ORDER — HYDRALAZINE HCL 20 MG/ML IJ SOLN
10.0000 mg | Freq: Once | INTRAMUSCULAR | Status: AC
  Administered 2012-12-18: 07:00:00 10 mg via INTRAVENOUS
  Filled 2012-12-18: qty 1

## 2012-12-18 MED ORDER — CLONIDINE HCL ER 0.1 MG PO TB12: 0.2000 mg | ORAL_TABLET | Freq: Every evening | ORAL | Status: DC

## 2012-12-18 MED ORDER — CLONIDINE HCL 0.2 MG PO TABS
0.2000 mg | ORAL_TABLET | Freq: Two times a day (BID) | ORAL | Status: DC
  Administered 2012-12-18 – 2012-12-19 (×3): 0.2 mg via ORAL
  Filled 2012-12-18 (×4): qty 1

## 2012-12-18 NOTE — Progress Notes (Addendum)
Subjective: Patient seen by neurology with recent admission on 11/8.  At that time evaluated for possible seizure.  Work up unremarkable including EEG.  Patient unable to have MRI secondary to pacer.  Patient was discharged on no anticonvulsants.  Returns on yesterday after breakfast when he went out to smoke and came inside to have his usual nap. His wife noticed him to be drowsy with drooling saliva and difficult to arouse for almost 10-12 minutes. He was sweaty as well. His fsg was in 150s. No seizure like activity. Patient incontinent with urine at baseline. No fall or tongue bite. Patient does have a history of stroke with residual right hemiparesis.   Objective: Current vital signs: BP 183/58  Pulse 62  Temp(Src) 97.2 F (36.2 C) (Oral)  Resp 18  Ht 5\' 8"  (1.727 m)  Wt 94.53 kg (208 lb 6.4 oz)  BMI 31.69 kg/m2  SpO2 100% Vital signs in last 24 hours: Temp:  [97.2 F (36.2 C)-97.7 F (36.5 C)] 97.2 F (36.2 C) (11/12 0126) Pulse Rate:  [52-69] 62 (11/12 0406) Resp:  [10-20] 18 (11/12 0126) BP: (75-212)/(28-86) 183/58 mmHg (11/12 0406) SpO2:  [88 %-100 %] 100 % (11/12 0126) Weight:  [94.348 kg (208 lb)-94.53 kg (208 lb 6.4 oz)] 94.53 kg (208 lb 6.4 oz) (11/11 1616)  Intake/Output from previous day: 11/11 0701 - 11/12 0700 In: 226 [P.O.:220; I.V.:6] Out: -  Intake/Output this shift: Total I/O In: 6 [I.V.:6] Out: -  Nutritional status: Carb Control  Lab Results: Basic Metabolic Panel:  Recent Labs Lab 12/14/12 1331 12/15/12 0433 12/16/12 0420 12/17/12 1227  NA 136 140 141 138  K 3.8 3.8 3.5 3.9  CL 100 103 99 100  CO2 27 28 30 27   GLUCOSE 239* 135* 92 240*  BUN 17 14 17 20   CREATININE 1.11 1.01 0.99 1.34  CALCIUM 8.7 8.4 8.9 8.6    Liver Function Tests:  Recent Labs Lab 12/17/12 1227  AST 14  ALT 7  ALKPHOS 73  BILITOT 0.2*  PROT 7.5  ALBUMIN 3.1*   No results found for this basename: LIPASE, AMYLASE,  in the last 168 hours No results found for  this basename: AMMONIA,  in the last 168 hours  CBC:  Recent Labs Lab 12/14/12 1331 12/15/12 0433 12/16/12 0420 12/17/12 1227  WBC 8.6 6.5 6.3 6.7  NEUTROABS 6.4  --   --  5.0  HGB 9.2* 9.1* 10.0* 9.7*  HCT 28.2* 28.5* 31.3* 29.0*  MCV 98.6 97.9 97.8 97.3  PLT 225 245 253 246    Cardiac Enzymes:  Recent Labs Lab 12/17/12 1900 12/17/12 2245  TROPONINI <0.30 <0.30    Lipid Panel: No results found for this basename: CHOL, TRIG, HDL, CHOLHDL, VLDL, LDLCALC,  in the last 168 hours  CBG:  Recent Labs Lab 12/16/12 0642 12/16/12 1131 12/16/12 1611 12/17/12 1622 12/17/12 2122  GLUCAP 102* 127* 83 205* 144*    Microbiology: Results for orders placed during the hospital encounter of 01/01/12  SURGICAL PCR SCREEN     Status: None   Collection Time    01/01/12  9:48 AM      Result Value Range Status   MRSA, PCR NEGATIVE  NEGATIVE Final   Staphylococcus aureus NEGATIVE  NEGATIVE Final   Comment:            The Xpert SA Assay (FDA     approved for NASAL specimens     in patients over 20 years of age),  is one component of     a comprehensive surveillance     program.  Test performance has     been validated by [facility] for patients greater     than or equal to 26 year old.     It is not intended     to diagnose infection nor to     guide or monitor treatment.    Coagulation Studies: No results found for this basename: LABPROT, INR,  in the last 72 hours  Imaging: Dg Chest Portable 1 View  12/17/2012   CLINICAL DATA:  Altered mental status.  EXAM: PORTABLE CHEST - 1 VIEW  COMPARISON:  Chest x-ray 12/15/2012.  FINDINGS: Mediastinum and hilar structures are normal. New onset of right infrahilar and persistent left lower lobe infiltrates are present. Cardiomegaly with mild pulmonary vascular prominence. A component of CHF may be present. Cardiac pacer with lead tip projected over the right ventricle. Tiny left pleural effusion cannot be excluded. No  pneumothorax.  IMPRESSION: 1. Findings most consistent with mild congestive heart failure. Superimposed pneumonia in the lung bases cannot be excluded. 2. Cardiac pacer in stable position.   Electronically Signed   By: Maisie Fus  Register   On: 12/17/2012 13:13    Medications:  I have reviewed the patient's current medications. Prior to Admission:  Prescriptions prior to admission  Medication Sig Dispense Refill  . carvedilol (COREG) 25 MG tablet Take 12.5 mg by mouth 2 (two) times daily with a meal.      . cloNIDine (CATAPRES) 0.2 MG tablet Take 0.2 mg by mouth 2 (two) times daily.      . finasteride (PROSCAR) 5 MG tablet Take 1 tablet (5 mg total) by mouth daily.  30 tablet  prn  . furosemide (LASIX) 40 MG tablet Take 40 mg by mouth.      . hydrALAZINE (APRESOLINE) 25 MG tablet Take 25 mg by mouth 3 (three) times daily.      . insulin NPH (HUMULIN N,NOVOLIN N) 100 UNIT/ML injection Inject 30 Units into the skin daily before breakfast.      . olmesartan (BENICAR) 40 MG tablet Take 40 mg by mouth daily.      Marland Kitchen OVER THE COUNTER MEDICATION Take 1 tablet by mouth 2 (two) times a week. Vegetable Laxative OTC      . potassium chloride SA (K-DUR,KLOR-CON) 20 MEQ tablet Take 20 mEq by mouth 2 (two) times daily.      . sertraline (ZOLOFT) 50 MG tablet Take 50 mg by mouth daily.      . simvastatin (ZOCOR) 40 MG tablet Take 40 mg by mouth daily.      . tamsulosin (FLOMAX) 0.4 MG CAPS capsule Take 0.4 mg by mouth daily.       Scheduled: . azithromycin  500 mg Oral Daily  . carvedilol  12.5 mg Oral BID WC  . cefTRIAXone (ROCEPHIN)  IV  1 g Intravenous Q24H  . cloNIDine HCl  0.2 mg Oral QPM  . enoxaparin (LOVENOX) injection  40 mg Subcutaneous Q24H  . hydrALAZINE  10 mg Intravenous Once  . insulin aspart  0-15 Units Subcutaneous TID WC  . insulin NPH  30 Units Subcutaneous QAC breakfast  . irbesartan  300 mg Oral Daily  . sodium chloride  3 mL Intravenous Q12H    Assessment/Plan: Patient with risk  factors for seizure (past infarct, history of alcohol and cocaine abuse) but no clear clinical evidence that he has actually  had a seizure.  Work up unremarkable.  No seizure activity witnessed.    Recommendations: 1.  Anticonvulsant therapy not indicated at this time.  2.  Repeat EEG.  This may increase yield of finding evidence to support seizure activity.  This may be done as an inpatient or outpatient. 3.  Patient may follow up with [facility] as an outpatient.    Case discussed with Dr. Debby Bud.   LOS: 1 day   Thana Farr, MD [facility] [phone] 12/18/2012  6:52 AM

## 2012-12-18 NOTE — Progress Notes (Signed)
EEG completed today, remains alert and oriented times 4, NAD noted able to communicate needs, BG-68, Orange juice given, rechecked BG and BG 134, NAD noted, will continue to monitor

## 2012-12-18 NOTE — Progress Notes (Signed)
12/17/12 7p-7a shift. Pt.is A/Ox2. He has residual right sided weakness from past CVA . He has had no c/o pain and no signs of distress. Pt.started to have increasingly elevated BP during the shift when notified by the NT manual and automatic blood pressures were checked by the RN. Once during the shift around 0004 blood pressure had lowered slightly. Initially MD on call with Corinda Gubler was notified of blood pressure. Pt.had orders for prn Hydralazline po and that was given for elevated BP. Called MD on call again to notify of elevated BP after intervention. Received a call back from MD and I was referred to internal medicine. From there referred to [facility]. From there Mid-level provider was notified of elevated BP's and a order for prn Hydralazine IV was given.

## 2012-12-18 NOTE — Procedures (Signed)
ELECTROENCEPHALOGRAM REPORT   Patient: [name]       Room #: 1O10 EEG No. ID: [id number] Age: 72 y.o.        Sex: male Referring Physician: Norrins Report Date:  12/18/2012        Interpreting Physician: Aline Brochure  History: [name] is an 72 y.o. male with a history of previous stroke, admitted following a possible generalized seizure.  Indications for study:  Rule out new onset seizure disorder.  Technique: This is an 18 channel routine scalp EEG performed at the bedside with bipolar and monopolar montages arranged in accordance to the international 10/20 system of electrode placement.   Description: This EEG recording was performed during wakefulness. Background activity consisted of 6-7 Hz diffuse symmetrical theta activity which was more organized posteriorly. There was fairly good attenuation of background activity with eye opening. Photic stimulation was not performed. Hyperventilation was not performed. No epileptiform discharges were recorded.  Interpretation: This EEG is abnormal with mild generalized nonspecific continuous slowing of cerebral activity. This pattern of slowing can be seen with metabolic as well as degenerative central nervous system disorders. No evidence of an epileptic disorder was recorded.   Venetia Maxon M.D. [facility] [phone]

## 2012-12-18 NOTE — Progress Notes (Signed)
EEG Completed; Results Pending  

## 2012-12-18 NOTE — Progress Notes (Signed)
Pt resting comfortable on bed, denies any pain or discomfort, VSS, no distress noticed.

## 2012-12-18 NOTE — Progress Notes (Signed)
Hypoglycemic Event  CBG: 68  Treatment: 15 GM carbohydrate snack  Symptoms: None  Follow-up CBG: Time:1716 CBG Result:134  Possible Reasons for Event: Unknown  Comments/MD notified:Pt asymptomatic.    [name], [name]  Remember to initiate Hypoglycemia Order Set & complete

## 2012-12-18 NOTE — Progress Notes (Signed)
Holding note: reviewed d/c summary and date; reviewed admitting imaging, labs and admit note. Scant evidence of infection: minimal rise in temp, normal WBC and diff, no hypoxemia, no cough or sputum production. - have order procalcitonin to establish infection. Will continue antibiotics for now Nejuro- had negative w/u for seizure. Have requested Neuro consult re: need for anticonvulsant therapy long term. Hypotension resolved will resume home meds.   Will return this PM - hopefull for discharge

## 2012-12-18 NOTE — Progress Notes (Signed)
Inpatient Diabetes Program Recommendations  AACE/ADA: New Consensus Statement on Inpatient Glycemic Control (2013)  Target Ranges:  Prepandial:   less than 140 mg/dL      Peak postprandial:   less than 180 mg/dL (1-2 hours)      Critically ill patients:  140 - 180 mg/dL   Reason for Visit: Hyperglycemia  Results for ELLIAS, MCELREATH (MRN [id number]) as of 12/18/2012 10:46  Ref. Range 12/16/2012 04:20 12/17/2012 12:27  Glucose Latest Range: 70-99 mg/dL 92 409 (H)  Results for CRAIGE, PATEL (MRN [id number]) as of 12/18/2012 10:46  Ref. Range 12/17/2012 16:22 12/17/2012 21:22 12/18/2012 06:59  Glucose-Capillary Latest Range: 70-99 mg/dL 956 (H) 213 (H) 086 (H)    Inpatient Diabetes Program Recommendations Correction (SSI): Add Novolog sensitive tidwc  Note: Will continue to follow. Thank you. Ailene Ards, RD, LDN, CDE Inpatient Diabetes Coordinator [phone]

## 2012-12-19 DIAGNOSIS — I69959 Hemiplegia and hemiparesis following unspecified cerebrovascular disease affecting unspecified side: Secondary | ICD-10-CM

## 2012-12-19 DIAGNOSIS — E1065 Type 1 diabetes mellitus with hyperglycemia: Secondary | ICD-10-CM

## 2012-12-19 DIAGNOSIS — I509 Heart failure, unspecified: Secondary | ICD-10-CM

## 2012-12-19 DIAGNOSIS — I10 Essential (primary) hypertension: Secondary | ICD-10-CM

## 2012-12-19 LAB — GLUCOSE, CAPILLARY: Glucose-Capillary: 90 mg/dL (ref 70–99)

## 2012-12-19 NOTE — ED Provider Notes (Signed)
I saw and evaluated the patient, reviewed the resident's note and I agree with the findings and plan.  EKG Interpretation     Ventricular Rate:  65 PR Interval:  252 QRS Duration: 158 QT Interval:  507 QTC Calculation: 527 R Axis:   -77 Text Interpretation:  Sinus rhythm Prolonged PR interval RBBB and LAFB No significant change was found              Candyce Churn, MD 12/19/12 [id number]

## 2012-12-19 NOTE — Progress Notes (Signed)
Subjective: No complaints. No report of neurologic events or symptoms  Objective: Lab:  Recent Labs  12/17/12 1227  WBC 6.7  NEUTROABS 5.0  HGB 9.7*  HCT 29.0*  MCV 97.3  PLT 246    Recent Labs  12/17/12 1227  NA 138  K 3.9  CL 100  GLUCOSE 240*  BUN 20  CREATININE 1.34  CALCIUM 8.6   procalcitonin<10  Imaging: EEG 12/18/12: second study - Interpretation: This EEG is abnormal with mild generalized nonspecific continuous slowing of cerebral activity. This pattern of slowing can be seen with metabolic as well as degenerative central nervous system disorders. No evidence of an epileptic disorder was recorded.  Scheduled Meds: . azithromycin  500 mg Oral Daily  . carvedilol  12.5 mg Oral BID WC  . cefTRIAXone (ROCEPHIN)  IV  1 g Intravenous Q24H  . cloNIDine  0.2 mg Oral BID  . enoxaparin (LOVENOX) injection  40 mg Subcutaneous Q24H  . insulin aspart  0-15 Units Subcutaneous TID WC  . insulin NPH  30 Units Subcutaneous QAC breakfast  . irbesartan  300 mg Oral Daily  . sodium chloride  3 mL Intravenous Q12H   Continuous Infusions:  PRN Meds:.acetaminophen, acetaminophen, hydrALAZINE, ondansetron (ZOFRAN) IV, ondansetron   Physical Exam: Filed Vitals:   12/19/12 0448  BP: 179/75  Pulse: 51  Temp: 97.5 F (36.4 C)  Resp: 20   See d/c summary     Assessment/Plan: No evidence of seizure activity. No evidence of respiratory infection other than question of infiltrate on X-ray - not a definite call.  He is for d/c home. Dictated # [id number]   Illene Regulus [place] [facility] (o) [phone]; (c) [phone] Call-grp - [facility]  Tele: [phone]  12/19/2012, 6:14 AM

## 2012-12-19 NOTE — Progress Notes (Signed)
Pt d/c home with family. D/c instructions and medications reviewed with wife. Wife states understanding, all wife's questions answered

## 2012-12-19 NOTE — Progress Notes (Signed)
Pt in bed, MD order d/c, Pt ask RN to call wife and let her no. MD does not want to continue antibiotics. Pt has no complaints of pain or sob. Will continue to monitor

## 2012-12-20 NOTE — Discharge Summary (Signed)
NAME:  [name], [name] NO.:  [id number]  MEDICAL RECORD NO.:  [id number]  LOCATION:  4E22C                        FACILITY:  [facility]  PHYSICIAN:  Rosalyn Gess. Norins, MD  DATE OF BIRTH:  03/15/1940  DATE OF ADMISSION:  12/17/2012 DATE OF DISCHARGE:  12/19/2012                              DISCHARGE SUMMARY   ADMITTING DIAGNOSES: 1. Acute encephalopathy. 2. Abnormal chest x-ray. 3. Hypertension. 4. History of congestive heart failure with question of pulmonary     edema on x-ray. 5. Cerebrovascular accident with right hemiparesis, chronic. 6. Tobacco abuse.  DISCHARGE DIAGNOSES: 1. Acute encephalopathy. 2. Abnormal chest x-ray. 3. Hypertension. 4. History of congestive heart failure with question of pulmonary     edema on x-ray. 5. Cerebrovascular accident with right hemiparesis, chronic. 6. Tobacco abuse.  CONSULTANTS:  Dr. Thad Ranger for Neurology.  PROCEDURES/IMAGING: 1. Chest x-ray on the day of admission, December 17, 2012, read as     showing findings most consistent with mild CHF.  Superimposed     pneumonia in the lung bases cannot be excluded.  Cardiac pacer in     stable position. 2. EEG read by Dr. Noel Christmas, final interpretation EEG is     abnormal with mild generalized nonspecific continuous slowing of     cerebral activity.  This pattern of slowing can be seen with     metabolic as well as degenerative central nervous system disorders.     No evidence of epileptic disorder was recorded.  HISTORY OF PRESENT ILLNESS:  Mr. [name] had recently been hospitalized for a question of abnormal chest x-ray with infiltrate with possible pneumonia and a question of seizure disorder.  During that initial admission, the patient had no evidence of infection with a normal CBC, normal oxygen saturations.  No cough or sputum production and no leukocytosis.  He was discharged without antibiotics because the lack of findings for infection.  The patient was  evaluated for seizure disorder at that admission and had an EEG which was without epileptic focus.  He was not sent home on anticonvulsant therapy.  The patient presented to the emergency department on the day of admission because of mental status change.  After his usual morning routine of breakfast and a smoke when he came inside his wife noticed him to be more drowsy than usual with drooling of saliva and some difficulty in arousal.  There was no seizure-like activity witnessed. The patient is incontinent of urine at baseline.  The patient had no fall, no tongue injury.  He denied any headache, blurred vision, nausea, vomiting, dizziness chest pain, or palpitations.  At his ER evaluation, the patient did have a chest x-ray with mild CHF and a question of possible basilar infiltrate.  He be given IV vancomycin and Zosyn doses in the emergency department and was subsequently admitted for possible seizure and pneumonia.  Please see the recent discharge summary as well as the H and P to this admission for past medical history, family history, social history, and admission examination.  HOSPITAL COURSE: 1. Seizure disorder.  The patient did not have a convincing history     for seizure-like activity at home.  Recent evaluation was negative     for seizure focus.  At this admission, the patient was seen by Dr.     Thad Ranger who felt that seizure was unlikely.  She did recommend a     repeat EEG and if negative, did not feel that the patient would     need long-term anticonvulsant therapy.  For mental status change     events, she recommended he have outpatient followup by Neurology.     The patient did have an EEG with findings as above.  He had no     further events while hospitalized.  At this point, he is stable and     ready for discharge home and will not need anticonvulsant therapy.     Plan will be to set the patient up for outpatient Neurology with     either Dr. Allena Katz or Dr.  Trey Paula at [facility]. 2. Pulmonary/ID.  The patient with a chest x-ray with question of     bibasilar infiltrate.  He was given initial dose of vanc and Zosyn     and then was started on azithromycin and Rocephin.  The patient was     afebrile at admission and remained afebrile.  The patient never     demonstrated a leukocytosis.  He had normal oxygen saturations     throughout his stay.  He had no productive cough.  Procalcitonin     was ordered to help determine if there was infection present which     came back as negative.  At this point, there is scant evidence that     the patient has a respiratory infection with the only abnormal     finding being a possible basilar infiltrate.  Plan, the patient was     adamantly encouraged not to smoke.  He does not need continuing     antibiotics at this point. 3. Cardiac.  The patient with a question of mild CHF on chest x-ray.     The patient did have a mildly elevated BNP on day of admission at     926.7.  He had no clinical evidence of CHF.  Plan, the patient will     continue on his home medications. 4. With the patient having ruled out for seizure with EEG that was     negative for epileptic activity x2 and no need for further     inpatient Neurology evaluation, with no clinical evidence of     pneumonia with the only abnormality being abnormal chest x-ray, the     patient is stable and at this point ready for discharge to home.  DISCHARGE EXAMINATION:  VITAL SIGNS:  Temperature was 97.5, blood pressure 179/75, heart rate was 51, respirations 20, oxygen saturations 100% on room air. GENERAL APPEARANCE:  This is an older chronically ill-appearing gentleman status post CVA who is in no acute distress. HEENT:  Normocephalic, atraumatic.  Conjunctivae and sclerae were clear. The patient tracks with both eyes.  Oropharynx with reasonable dentition. NECK:  Supple. PULMONARY:  The patient has no increased work of breathing.  No rales  or wheezes were appreciated. CARDIOVASCULAR:  2+ radial pulses.  Precordium was quiet.  Heart sounds were distant, but regular. ABDOMEN:  Soft.  No guarding or rebound was noted. NEUROLOGIC:  The patient is awake, he is alert.  He is oriented to person, place, time, context, and examiner.  Speech is clear.  Cranial nerves II-XII, the patient has normal facial symmetry  and movement. Extraocular muscles were intact.  Motor strength, the patient has weakness on the right side of his body that is an old finding from previous CVA.  No further testing was performed.  FINAL LABORATORY DATA:  Procalcitonin was less than 0.10 on December 18, 2012.  Lactic acid at admission was 1.76.  Troponin I was less than 0.30 x3.  Chemistries at admission revealed a sodium of 138, potassium 3.9, chloride of 100, CO2 of 27, BUN of 20, creatinine 1.34, glucose was 240. Liver functions were normal with a mildly depressed albumin at 3.1 and mildly depressed total protein at 0.2.  BNP was 926.7.  CBC on the day of admission with a white count of 6700, hemoglobin 9.7 g, hematocrit 29%, platelet count 246,000.  Differential was normal with 74% segs, 15% lymphs, 9% monos, 2% eosinophils.  The patient did have a hemoglobin A1c in his last admission December 15, 2012, at 7.1%.  IMAGING:  Studies as noted.  DISCHARGE MEDICATIONS:  The patient will essentially resume his home medications as follows: 1. Carvedilol 12.5 mg b.i.d. 2. Clonidine 0.2 mg 1 tablet b.i.d. 3. Proscar 5 mg daily. 4. Lasix 40 mg daily. 5. Hydralazine 25 mg t.i.d. 6. Humulin N 30 units before breakfast. 7. Benicar 40 mg daily. 8. Vegetable laxative daily. 9. Potassium 20 mEq daily. 10.Zoloft 50 mg daily. 11.Zocor 40 mg daily. 12.Flomax 0.4 mg daily.  DISPOSITION:  The patient is discharged to home.  He will be seen in the office for followup in 5-7 days.  At his followup outpatient visit, he will be referred to Neurology for further  evaluation and treatment.  The patient's condition at time of discharge dictation is stable.     Rosalyn Gess Norins, MD     MEN/MEDQ  D:  12/19/2012  T:  12/19/2012  Job:  [id number]

## 2012-12-23 ENCOUNTER — Telehealth: Payer: Self-pay | Admitting: General Practice

## 2012-12-23 LAB — CULTURE, BLOOD (ROUTINE X 2): Culture: NO GROWTH

## 2012-12-23 NOTE — Telephone Encounter (Signed)
Transitional care call:  Patient dc'd from hospital on 11/13.  Hospital dc diagnosis:  Mild CHF and mental status changes.  Spoke with patient's wife.  Wife states that she nor patient have questions regarding hospital discharge instructions.  RN reviewed medications with wife and no questions in that regard.  All medications are in the home.  Patient lives wife wife. Patient has follow up appointment with Dr. Debby Bud on Tuesday, December 31, 2012.  Wife denies questions for Dr. Debby Bud at this time.

## 2012-12-30 ENCOUNTER — Ambulatory Visit (INDEPENDENT_AMBULATORY_CARE_PROVIDER_SITE_OTHER): Payer: Medicare Other | Admitting: Internal Medicine

## 2012-12-30 ENCOUNTER — Encounter: Payer: Self-pay | Admitting: Internal Medicine

## 2012-12-30 VITALS — BP 108/64 | HR 64

## 2012-12-30 DIAGNOSIS — Z23 Encounter for immunization: Secondary | ICD-10-CM

## 2012-12-30 DIAGNOSIS — I10 Essential (primary) hypertension: Secondary | ICD-10-CM

## 2012-12-30 DIAGNOSIS — G9340 Encephalopathy, unspecified: Secondary | ICD-10-CM

## 2012-12-30 DIAGNOSIS — E1065 Type 1 diabetes mellitus with hyperglycemia: Secondary | ICD-10-CM

## 2012-12-30 NOTE — Progress Notes (Signed)
Subjective:    Patient ID: [name], male    DOB: 1940/12/29, 72 y.o.   MRN: [id number]  HPI [name] presents for hospital follow up. He was admitted 11/8-12/1012 and readmitted 1111 - 111314. Both times for possible seizure and possible PNA. Reviewed d/c summaries and notes and studies. He had neuro consults and EEG x 2 w/o evidence of seizures/epilepsy. He was not felt to need seizure meds per Dr. Thad Ranger but he would need outpt neuro follow up.   At both admissions there was a concern for CAP.  However, despite a question of infiltrate vs atelectasis on x-ray therewas no leukocytosis, fever, productive cough or hypoxemia. He was not treated with antibiotics except for initial doses ordered in ED at times of admission.  Since his second d/c he reports he has been doing well with no recurrent change in level of consciousness, no respiratory symptoms and he has been at his baseline.   Past Medical History  Diagnosis Date  . 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 Automatic implantable cardiac defibrillator in situ   . Congestive heart failure, unspecified   . CVA (cerebral vascular accident)   . Retention of urine, unspecified   . Pneumonia, organism unspecified   . Cough   . Acute bronchitis   . Contusion of foot   . Other sign and symptom in breast   . Fibromyalgia   . Hyperlipidemia   . Coronary atherosclerosis of unspecified type of vessel, native or graft   . Esophageal reflux   . Type I (juvenile type) diabetes mellitus without mention of complication, uncontrolled   . Essential hypertension, benign   . Cocaine abuse   . Alcohol abuse   . Microalbuminuria   . GERD (gastroesophageal reflux disease)   . Arteriovenous malformation (AVM)     of colon  . Diverticulosis   . GI bleed     secondary to avm   Past Surgical History  Procedure Laterality Date  . Back surgery      X 2  . Tonsillectomy    . Cardiac defibrillator placement     Family  History  Problem Relation Age of Onset  . Heart failure Mother   . Diabetes Sister   . Colon cancer Brother     questionable  . Prostate cancer Neg Hx    History   Social History  . Marital Status: Married    Spouse Name: N/A    Number of Children: N/A  . Years of Education: N/A   Occupational History  . 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     Social History Main Topics  . Smoking status: Current Every Day Smoker -- 2.00 packs/day for 55 years    Types: Cigarettes  . Smokeless tobacco: Never Used     Comment: down to 6 cigarettes a day  . Alcohol Use: No     Comment: quit at the time of stroke  . Drug Use: No     Comment: quit at the time of his stroke  . Sexual Activity: Not Currently   Other Topics Concern  . Not on file   Social History Narrative   7th grade education. Married '67. 3 sons - '67, 72, '74; 1 daughter '69. Worked for [employer]. Retired on disability due to CVA with right hemiparesis '10  Current Outpatient Prescriptions on File Prior to Visit  Medication Sig Dispense Refill  . carvedilol (COREG) 25 MG tablet Take 12.5 mg by mouth 2 (two) times daily with a meal.      . cloNIDine (CATAPRES) 0.2 MG tablet Take 0.2 mg by mouth 2 (two) times daily.      . finasteride (PROSCAR) 5 MG tablet Take 1 tablet (5 mg total) by mouth daily.  30 tablet  prn  . furosemide (LASIX) 40 MG tablet Take 40 mg by mouth.      . hydrALAZINE (APRESOLINE) 25 MG tablet Take 25 mg by mouth 3 (three) times daily.      . insulin NPH (HUMULIN N,NOVOLIN N) 100 UNIT/ML injection Inject 30 Units into the skin daily before breakfast.      . olmesartan (BENICAR) 40 MG tablet Take 40 mg by mouth daily.      Marland Kitchen OVER THE COUNTER MEDICATION Take 1 tablet by mouth 2 (two) times a week. Vegetable Laxative OTC      . potassium chloride SA (K-DUR,KLOR-CON) 20 MEQ tablet Take 20 mEq by mouth 2 (two) times daily.      .  sertraline (ZOLOFT) 50 MG tablet Take 50 mg by mouth daily.      . simvastatin (ZOCOR) 40 MG tablet Take 40 mg by mouth daily.      . tamsulosin (FLOMAX) 0.4 MG CAPS capsule Take 0.4 mg by mouth daily.       No current facility-administered medications on file prior to visit.       Review of Systems System review is negative for any constitutional, cardiac, pulmonary, GI or neuro symptoms or complaints other than as described in the HPI.      Objective:   Physical Exam Filed Vitals:   12/30/12 1538  BP: 108/64  Pulse: 64    Gen'l- WNWD heavy set man in no distress HEENT - C&S clear Cor - 2+ radial, RRR PUlm - no increased WOB, no rales or wheezes but inspirations are shallow. Neuro - right hemiparesis from old stroke but otherwise at his baseline level of consciousness.        Assessment & Plan:  1. Pulmonary - normal exam w/o evidence of infection.  2. Change in level of consciousness - two episodes of a change in LOC, question of encephalopathy. The history of these events was not typical for seizure and Hospital evaluation was negative for seizure disorder. Of note - drug screen at first admission was positive for cocaine although the patient denies any recurrent use of cocaine. He will be referred to neurology to complete/finalize evaluation.

## 2012-12-30 NOTE — Progress Notes (Signed)
Pre visit review using our clinic review tool, if applicable. No additional management support is needed unless otherwise documented below in the visit note. 

## 2012-12-30 NOTE — Patient Instructions (Signed)
Thanks for coming in.  [name] yo have been doing well since Nov 13th hospital discharge.  On exam today - no neurologic issues other than old stroke findings. NO evidence of any lung problems or infection.  Plan  Out patient neurology evaluation to be sure everything remians ok.  Continue all you medications and you may resume aspirin.

## 2012-12-31 NOTE — Assessment & Plan Note (Signed)
Lab Results  Component Value Date   HGBA1C 7.1* 12/15/2012   Stable control on present regimen.

## 2012-12-31 NOTE — Assessment & Plan Note (Signed)
Taking his medications. Recent hospitalizations - no HTN issues.  BP Readings from Last 3 Encounters:  12/30/12 108/64  12/19/12 146/59  12/16/12 138/59   Plan Continue present medications

## 2013-01-06 ENCOUNTER — Other Ambulatory Visit: Payer: Self-pay | Admitting: Internal Medicine

## 2013-01-09 ENCOUNTER — Telehealth: Payer: Self-pay | Admitting: Internal Medicine

## 2013-01-09 NOTE — Telephone Encounter (Signed)
Phone call to patient left a message. Need to know what mail order pharmacy the prescriptions need to go to. Do no have one on file.

## 2013-01-09 NOTE — Telephone Encounter (Signed)
01/09/2013 Pt is needing all current RX to be written as 90 day supply instead of 30 day for Mail Order. Pt is changing to new insurance in January.  ° °

## 2013-02-04 ENCOUNTER — Other Ambulatory Visit: Payer: Self-pay

## 2013-02-04 MED ORDER — CARVEDILOL 25 MG PO TABS: 12.5000 mg | ORAL_TABLET | Freq: Two times a day (BID) | ORAL | Status: DC

## 2013-03-04 ENCOUNTER — Encounter: Payer: Self-pay | Admitting: Internal Medicine

## 2013-03-04 ENCOUNTER — Ambulatory Visit (INDEPENDENT_AMBULATORY_CARE_PROVIDER_SITE_OTHER): Payer: Medicare HMO | Admitting: Internal Medicine

## 2013-03-04 VITALS — BP 98/50 | HR 61 | Temp 97.1°F

## 2013-03-04 DIAGNOSIS — IMO0002 Reserved for concepts with insufficient information to code with codable children: Secondary | ICD-10-CM

## 2013-03-04 DIAGNOSIS — I69959 Hemiplegia and hemiparesis following unspecified cerebrovascular disease affecting unspecified side: Secondary | ICD-10-CM

## 2013-03-04 DIAGNOSIS — I10 Essential (primary) hypertension: Secondary | ICD-10-CM

## 2013-03-04 DIAGNOSIS — E1065 Type 1 diabetes mellitus with hyperglycemia: Secondary | ICD-10-CM

## 2013-03-04 DIAGNOSIS — Z23 Encounter for immunization: Secondary | ICD-10-CM

## 2013-03-04 DIAGNOSIS — I509 Heart failure, unspecified: Secondary | ICD-10-CM

## 2013-03-04 MED ORDER — TAMSULOSIN HCL 0.4 MG PO CAPS
0.4000 mg | ORAL_CAPSULE | Freq: Every day | ORAL | Status: DC
Start: 2013-03-04 — End: 2013-12-04

## 2013-03-04 MED ORDER — FINASTERIDE 5 MG PO TABS: 5.0000 mg | ORAL_TABLET | Freq: Every day | ORAL | Status: DC

## 2013-03-04 MED ORDER — CARVEDILOL 25 MG PO TABS: 12.5000 mg | ORAL_TABLET | Freq: Two times a day (BID) | ORAL | Status: DC

## 2013-03-04 MED ORDER — VALSARTAN 320 MG PO TABS: 320.0000 mg | ORAL_TABLET | Freq: Every day | ORAL | Status: DC

## 2013-03-04 MED ORDER — SERTRALINE HCL 50 MG PO TABS: 50.0000 mg | ORAL_TABLET | Freq: Every day | ORAL | Status: DC

## 2013-03-04 MED ORDER — CLONIDINE HCL 0.2 MG PO TABS: 0.2000 mg | ORAL_TABLET | Freq: Two times a day (BID) | ORAL | Status: DC

## 2013-03-04 MED ORDER — INSULIN NPH (HUMAN) (ISOPHANE) 100 UNIT/ML ~~LOC~~ SUSP: 30.0000 [IU] | Freq: Every day | SUBCUTANEOUS | Status: DC

## 2013-03-04 MED ORDER — POTASSIUM CHLORIDE CRYS ER 20 MEQ PO TBCR: 20.0000 meq | EXTENDED_RELEASE_TABLET | Freq: Two times a day (BID) | ORAL | Status: DC

## 2013-03-04 MED ORDER — SIMVASTATIN 40 MG PO TABS: 40.0000 mg | ORAL_TABLET | Freq: Every day | ORAL | Status: DC

## 2013-03-04 MED ORDER — FUROSEMIDE 40 MG PO TABS: 40.0000 mg | ORAL_TABLET | Freq: Every day | ORAL | Status: DC

## 2013-03-04 MED ORDER — HYDRALAZINE HCL 25 MG PO TABS: 25.0000 mg | ORAL_TABLET | Freq: Three times a day (TID) | ORAL | Status: DC

## 2013-03-04 NOTE — Progress Notes (Signed)
Pre visit review using our clinic review tool, if applicable. No additional management support is needed unless otherwise documented below in the visit note. 

## 2013-03-04 NOTE — Progress Notes (Signed)
Subjective:    Patient ID: Frank ChromanJames L [name], male    DOB: 08/29/1940, 73 y.o.   MRN: [id number]  HPI [name] presents today due to changes in insurance to [employer]. He also needs Rx for diabetic shoes - will need foot exam. He needs re certification of nocturnal hypoxemia for home O2 - by [employer]. He needs all meds changed to [facility].   [name] has no specific complaints.  Past Medical History  Diagnosis Date  . 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 Automatic implantable cardiac defibrillator in situ   . Congestive heart failure, unspecified   . CVA (cerebral vascular accident)   . Retention of urine, unspecified   . Pneumonia, organism unspecified   . Cough   . Acute bronchitis   . Contusion of foot   . Other sign and symptom in breast   . Fibromyalgia   . Hyperlipidemia   . Coronary atherosclerosis of unspecified type of vessel, native or graft   . Esophageal reflux   . Type I (juvenile type) diabetes mellitus without mention of complication, uncontrolled   . Essential hypertension, benign   . Cocaine abuse   . Alcohol abuse   . Microalbuminuria   . GERD (gastroesophageal reflux disease)   . Arteriovenous malformation (AVM)     of colon  . Diverticulosis   . GI bleed     secondary to avm   Past Surgical History  Procedure Laterality Date  . Back surgery      X 2  . Tonsillectomy    . Cardiac defibrillator placement     Family History  Problem Relation Age of Onset  . Heart failure Mother   . Diabetes Sister   . Colon cancer Brother     questionable  . Prostate cancer Neg Hx    History   Social History  . Marital Status: Married    Spouse Name: N/A    Number of Children: N/A  . Years of Education: N/A   Occupational History  . 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     Social History Main Topics  . Smoking status: Current Every Day Smoker -- 2.00 packs/day for 55 years    Types: Cigarettes   . Smokeless tobacco: Never Used     Comment: down to 6 cigarettes a day  . Alcohol Use: No     Comment: quit at the time of stroke  . Drug Use: No     Comment: quit at the time of his stroke  . Sexual Activity: Not Currently   Other Topics Concern  . Not on file   Social History Narrative   7th grade education. Married '67. 3 sons - '67, 72, '74; 1 daughter '69. Worked [employer]. 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 carvedilol (COREG) 25 MG tablet Take 0.5 tablets (12.5 mg total) by mouth 2 (two) times daily with a meal.  30 tablet  5  . cloNIDine (CATAPRES) 0.2 MG tablet Take 0.2 mg by mouth 2 (two) times daily.      . finasteride (PROSCAR) 5 MG tablet Take 1 tablet (5 mg total) by mouth daily.  30 tablet  prn  . furosemide (LASIX) 40 MG tablet Take 40 mg  by mouth.      . furosemide (LASIX) 40 MG tablet TAKE ONE TABLET BY MOUTH ONCE DAILY  30 tablet  5  . hydrALAZINE (APRESOLINE) 25 MG tablet Take 25 mg by mouth 3 (three) times daily.      . insulin NPH (HUMULIN N,NOVOLIN N) 100 UNIT/ML injection Inject 30 Units into the skin daily before breakfast.      . olmesartan (BENICAR) 40 MG tablet Take 40 mg by mouth daily.      Marland Kitchen OVER THE COUNTER MEDICATION Take 1 tablet by mouth 2 (two) times a week. Vegetable Laxative OTC      . potassium chloride SA (K-DUR,KLOR-CON) 20 MEQ tablet Take 20 mEq by mouth 2 (two) times daily.      . sertraline (ZOLOFT) 50 MG tablet Take 50 mg by mouth daily.      . simvastatin (ZOCOR) 40 MG tablet Take 40 mg by mouth daily.      . simvastatin (ZOCOR) 40 MG tablet TAKE ONE TABLET BY MOUTH ONCE DAILY AT BEDTIME  30 tablet  5  . tamsulosin (FLOMAX) 0.4 MG CAPS capsule Take 0.4 mg by mouth daily.       No current facility-administered medications on file prior to visit.       Review of Systems System review is  negative for any constitutional, cardiac, pulmonary, GI or neuro symptoms or complaints other than as described in the HPI.     Objective:   Physical Exam Filed Vitals:   03/04/13 1402  BP: 98/50  Pulse: 61  Temp: 97.1 F (36.2 C)   HEENT- no obvious abnormality Cor- 2+ radial, DP pulses PUlm - CTAP, no increased WOB Ext - both feet examined: no deformity, no callus, good pulses, no amputation.  Neuro -s/p CVA with right hemiparesis.       Assessment & Plan:

## 2013-03-07 ENCOUNTER — Telehealth: Payer: Self-pay | Admitting: Internal Medicine

## 2013-03-07 NOTE — Telephone Encounter (Signed)
Relevant patient education mailed to patient.  

## 2013-03-07 NOTE — Assessment & Plan Note (Signed)
No report of neurologic events since his last evaluation.

## 2013-03-07 NOTE — Assessment & Plan Note (Signed)
Diabetic shoes - a complete foot exam and review of history done. He does not meet criteria for diabetic shoes. This was explained fully to Mr. And Mrs. Solarz.  Lab Results  Component Value Date   HGBA1C 7.1* 12/15/2012   Good control of diabetes based on lab. Will continue present regimen.

## 2013-03-07 NOTE — Assessment & Plan Note (Signed)
Stable with no signs of decompensation.  PLan Continue present medical regimen.

## 2013-03-07 NOTE — Assessment & Plan Note (Signed)
BP Readings from Last 3 Encounters:  03/04/13 98/50  12/30/12 108/64  12/19/12 146/59   Very good control, with a low BP today which he is tolerating well.   Plan Continue present regimen

## 2013-03-14 ENCOUNTER — Telehealth: Payer: Self-pay | Admitting: *Deleted

## 2013-03-14 MED ORDER — FINASTERIDE 5 MG PO TABS: 5.0000 mg | ORAL_TABLET | Freq: Every day | ORAL | Status: DC

## 2013-03-14 MED ORDER — CLONIDINE HCL 0.2 MG PO TABS: 0.2000 mg | ORAL_TABLET | Freq: Two times a day (BID) | ORAL | Status: DC

## 2013-03-14 NOTE — Telephone Encounter (Signed)
Spouse phoned stating that refills were needed on all of patient's meds, that [facility] had not received refill requests.  Upon chart review, refills were submitted 03/04/13 to [facility].  Sent emergent supplies to local pharmacies for 2 weeks for clonidine and proscar while awaiting mail order delivery.  Advised spouse if [facility] continues to deny receipt of refill orders, to contact us the first of the week.   CB# [phone] (wife's cell)

## 2013-03-26 ENCOUNTER — Other Ambulatory Visit: Payer: Self-pay

## 2013-03-26 MED ORDER — INSULIN NPH (HUMAN) (ISOPHANE) 100 UNIT/ML ~~LOC~~ SUSP: 30.0000 [IU] | Freq: Every day | SUBCUTANEOUS | Status: DC

## 2013-03-26 MED ORDER — BD SWAB SINGLE USE REGULAR PADS: MEDICATED_PAD | Status: DC

## 2013-03-26 MED ORDER — ACCU-CHEK AVIVA VI SOLN: Status: DC

## 2013-03-26 MED ORDER — ACCU-CHEK SOFTCLIX LANCET DEV MISC: Status: DC

## 2013-03-26 MED ORDER — ACCU-CHEK AVIVA PLUS W/DEVICE KIT: PACK | Status: DC

## 2013-03-26 MED ORDER — GLUCOSE BLOOD VI STRP: ORAL_STRIP | Status: DC

## 2013-04-07 ENCOUNTER — Other Ambulatory Visit: Payer: Self-pay

## 2013-04-07 MED ORDER — INSULIN PEN NEEDLE 31G X 5 MM MISC: Status: DC

## 2013-04-22 ENCOUNTER — Encounter: Payer: Self-pay | Admitting: Internal Medicine

## 2013-04-22 ENCOUNTER — Ambulatory Visit (INDEPENDENT_AMBULATORY_CARE_PROVIDER_SITE_OTHER): Payer: Medicare HMO | Admitting: Internal Medicine

## 2013-04-22 VITALS — BP 100/50 | HR 62 | Temp 98.1°F | Resp 15

## 2013-04-22 DIAGNOSIS — I69959 Hemiplegia and hemiparesis following unspecified cerebrovascular disease affecting unspecified side: Secondary | ICD-10-CM

## 2013-04-22 DIAGNOSIS — IMO0001 Reserved for inherently not codable concepts without codable children: Secondary | ICD-10-CM

## 2013-04-22 DIAGNOSIS — M7918 Myalgia, other site: Secondary | ICD-10-CM

## 2013-04-22 MED ORDER — TRAMADOL HCL 50 MG PO TABS: 50.0000 mg | ORAL_TABLET | Freq: Three times a day (TID) | ORAL | Status: DC | PRN

## 2013-04-22 NOTE — Patient Instructions (Signed)
Use an anti-inflammatory cream such as Aspercreme or Zostrix cream twice a day to the affected area as needed. In lieu of this warm moist compresses or  hot water bottle can be used. Do not apply ice . Caries ( cavities) and plaque on the teeth can lead to significant local and systemic infections with threat to your health.Please have dental care completed as soon as possible.

## 2013-04-22 NOTE — Progress Notes (Signed)
   Subjective:    Patient ID: Frank ChromanJames L Ibsen, male    DOB: 08/20/1940, 73 y.o.   MRN: [id number]  HPI    He's been having pain in the posterior aspect of the head and neck since 04/17/13. There was no trigger or injury prior to onset. It is worse on the right than the left. It is persistent and worse through the day. It does improve when he is supine.  He's unable to qualify the type of pain. It can be as severe as a level IX on a 10 scale.  Aleve was of no benefit.  He has sweats which are chronic and not associated.  He is an active smoker up to 7-8 cigarettes per day.    Review of Systems  He is not having fever or chills.   He has no associated pain in the shoulder girdle area  He is not had any new tinnitus or hearing loss. He also denies any new blurred vision, double vision, or loss of vision  He has no frontal headache, facial pain, or nasal purulence  He does have a chronic cough and aspirates according to his wife. He is on oxygen at night but not during the day when he is sitting.     Objective:   Physical Exam Gen.:  well-nourished in appearance. Withdrawn but  cooperative throughout exam.In power W/C.  Head: Normocephalic without obvious abnormalities  Eyes: No corneal or conjunctival inflammation noted. Pupils equal  But small. Extraocular motion intact. FOV grossly normal with  Ears: External  ear exam reveals no significant lesions or deformities. Some wax on the left; minimal wax on the right. Nose: External nasal exam reveals no deformity or inflammation. Nasal mucosa are pink and moist. No lesions or exudates noted.   Mouth: Oral mucosa and oropharynx reveal no lesions or exudates. Teeth in extremely poor repair; severe caries.Maxilla edentulous Neck: No deformities, masses, or tenderness noted. Range of motion markedly decreased laterally .Thyroid normal Lungs:Lungs are clear to auscultation without rales, wheezes, or increased work of breathing. Very decreased  BS; intermittent loose cough. Heart: Normal rate and rhythm. Normal S1 ;split S2. No gallop, click, or rub. No murmur.                                 Musculoskeletal/extremities:  R hemiparesis; strength fair -good LU & LLE Vascular: Carotid, radial artery pulses are full and equal. No bruits present. Neurologic: Not oriented to month & year.  Lymph: No cervical, axillary lymphadenopathy present. Psych: Mood and affect are flat                                                                                  Assessment & Plan:  #1 occipital and neck pain, musculoskeletal etiology suggested  #2 residual right hemiparesis with without apparent new neurologic signs  #3 severe caries, dental care recommended as soon as possible  Plan: See orders

## 2013-04-22 NOTE — Progress Notes (Signed)
Pre visit review using our clinic review tool, if applicable. No additional management support is needed unless otherwise documented below in the visit note. 

## 2013-05-21 ENCOUNTER — Telehealth: Payer: Self-pay | Admitting: Internal Medicine

## 2013-05-21 NOTE — Telephone Encounter (Signed)
05-21-13 left message with pt's son to have wife call, she's out of town but handles the appointments, pt past due for defib ck with [facility], last ck was remote 08-01-12/mt

## 2013-06-09 ENCOUNTER — Telehealth: Payer: Self-pay | Admitting: *Deleted

## 2013-06-09 NOTE — Telephone Encounter (Signed)
Who is PCP? Thx

## 2013-06-09 NOTE — Telephone Encounter (Signed)
pts wife called states they are having difficultly administering the insulin with the pen.  Requesting the vial.  Former [facility] pt.  Please advise

## 2013-06-10 NOTE — Telephone Encounter (Signed)
Pls cont w/pens - they are easier than vials. Make an appt w/his new PCP to discuss further Rx Thx

## 2013-06-10 NOTE — Telephone Encounter (Signed)
Pt has not re established yet.

## 2013-06-11 NOTE — Telephone Encounter (Signed)
Message copied by Etheleen SiaSTUBBS, NANCY W on Wed Jun 11, 2013 11:02 AM ------      Message from: Etheleen SiaSTUBBS, NANCY W      Created: Wed Jun 11, 2013 10:51 AM      Regarding: FW: new pt       Wife is aware.  Dr. Jonny RuizJohn has been added as the PCP.      ----- Message -----         From: Corwin LevinsJames W John, MD         Sent: 06/10/2013   4:34 PM           To: Etheleen SiaNancy W Stubbs      Subject: new pt                                                   Ok with me       ----- Message -----         From: Vladimir Croftsobin B Ewing         Sent: 06/10/2013   4:10 PM           To: Corwin LevinsJames W John, MD            This patients wife Theresia Lo(Patricia Shuart [birth year]) was seen today by you.  She is hoping you will take her husband on as well as a patient.  They both were previous Dr. Debby [facility] patients.             ------

## 2013-06-11 NOTE — Telephone Encounter (Signed)
Spoke with pt advised of MDs message 

## 2013-06-28 ENCOUNTER — Encounter (HOSPITAL_COMMUNITY): Payer: Self-pay | Admitting: Emergency Medicine

## 2013-06-28 ENCOUNTER — Emergency Department (HOSPITAL_COMMUNITY): Payer: Medicare HMO

## 2013-06-28 ENCOUNTER — Emergency Department (HOSPITAL_COMMUNITY)
Admission: EM | Admit: 2013-06-28 | Discharge: 2013-06-28 | Disposition: A | Discharge status: Home / Self Care | Payer: Medicare HMO | Attending: Emergency Medicine | Admitting: Emergency Medicine

## 2013-06-28 DIAGNOSIS — Z794 Long term (current) use of insulin: Secondary | ICD-10-CM | POA: Insufficient documentation

## 2013-06-28 DIAGNOSIS — Z87828 Personal history of other (healed) physical injury and trauma: Secondary | ICD-10-CM | POA: Insufficient documentation

## 2013-06-28 DIAGNOSIS — Z8709 Personal history of other diseases of the respiratory system: Secondary | ICD-10-CM | POA: Insufficient documentation

## 2013-06-28 DIAGNOSIS — I509 Heart failure, unspecified: Secondary | ICD-10-CM | POA: Insufficient documentation

## 2013-06-28 DIAGNOSIS — Z79899 Other long term (current) drug therapy: Secondary | ICD-10-CM | POA: Insufficient documentation

## 2013-06-28 DIAGNOSIS — Z8719 Personal history of other diseases of the digestive system: Secondary | ICD-10-CM | POA: Insufficient documentation

## 2013-06-28 DIAGNOSIS — R4182 Altered mental status, unspecified: Secondary | ICD-10-CM

## 2013-06-28 DIAGNOSIS — F17200 Nicotine dependence, unspecified, uncomplicated: Secondary | ICD-10-CM | POA: Insufficient documentation

## 2013-06-28 DIAGNOSIS — I10 Essential (primary) hypertension: Secondary | ICD-10-CM | POA: Insufficient documentation

## 2013-06-28 DIAGNOSIS — R569 Unspecified convulsions: Secondary | ICD-10-CM | POA: Insufficient documentation

## 2013-06-28 DIAGNOSIS — Z8673 Personal history of transient ischemic attack (TIA), and cerebral infarction without residual deficits: Secondary | ICD-10-CM | POA: Insufficient documentation

## 2013-06-28 DIAGNOSIS — I2510 Atherosclerotic heart disease of native coronary artery without angina pectoris: Secondary | ICD-10-CM | POA: Insufficient documentation

## 2013-06-28 DIAGNOSIS — E785 Hyperlipidemia, unspecified: Secondary | ICD-10-CM | POA: Insufficient documentation

## 2013-06-28 DIAGNOSIS — Z8701 Personal history of pneumonia (recurrent): Secondary | ICD-10-CM | POA: Insufficient documentation

## 2013-06-28 DIAGNOSIS — Z95810 Presence of automatic (implantable) cardiac defibrillator: Secondary | ICD-10-CM | POA: Insufficient documentation

## 2013-06-28 DIAGNOSIS — E109 Type 1 diabetes mellitus without complications: Secondary | ICD-10-CM | POA: Insufficient documentation

## 2013-06-28 DIAGNOSIS — Q279 Congenital malformation of peripheral vascular system, unspecified: Secondary | ICD-10-CM | POA: Insufficient documentation

## 2013-06-28 LAB — CBC WITH DIFFERENTIAL/PLATELET
Basophils Absolute: 0 10*3/uL (ref 0.0–0.1)
Basophils Relative: 0 % (ref 0–1)
EOS ABS: 0.1 10*3/uL (ref 0.0–0.7)
EOS PCT: 1 % (ref 0–5)
HCT: 32.5 % — ABNORMAL LOW (ref 39.0–52.0)
HEMOGLOBIN: 10.7 g/dL — AB (ref 13.0–17.0)
LYMPHS ABS: 1.1 10*3/uL (ref 0.7–4.0)
Lymphocytes Relative: 12 % (ref 12–46)
MCH: 32.3 pg (ref 26.0–34.0)
MCHC: 32.9 g/dL (ref 30.0–36.0)
MCV: 98.2 fL (ref 78.0–100.0)
Monocytes Absolute: 0.7 10*3/uL (ref 0.1–1.0)
Monocytes Relative: 7 % (ref 3–12)
Neutro Abs: 7.1 10*3/uL (ref 1.7–7.7)
Neutrophils Relative %: 80 % — ABNORMAL HIGH (ref 43–77)
Platelets: 263 10*3/uL (ref 150–400)
RBC: 3.31 MIL/uL — AB (ref 4.22–5.81)
RDW: 13.2 % (ref 11.5–15.5)
WBC: 9 10*3/uL (ref 4.0–10.5)

## 2013-06-28 LAB — URINALYSIS, ROUTINE W REFLEX MICROSCOPIC
BILIRUBIN URINE: NEGATIVE
Glucose, UA: NEGATIVE mg/dL
Hgb urine dipstick: NEGATIVE
Ketones, ur: NEGATIVE mg/dL
LEUKOCYTES UA: NEGATIVE
NITRITE: NEGATIVE
PH: 5 (ref 5.0–8.0)
Protein, ur: NEGATIVE mg/dL
Specific Gravity, Urine: 1.012 (ref 1.005–1.030)
Urobilinogen, UA: 1 mg/dL (ref 0.0–1.0)

## 2013-06-28 LAB — COMPREHENSIVE METABOLIC PANEL
ALBUMIN: 3.5 g/dL (ref 3.5–5.2)
ALT: 8 U/L (ref 0–53)
AST: 12 U/L (ref 0–37)
Alkaline Phosphatase: 76 U/L (ref 39–117)
BUN: 15 mg/dL (ref 6–23)
CO2: 26 mEq/L (ref 19–32)
CREATININE: 1.05 mg/dL (ref 0.50–1.35)
Calcium: 9 mg/dL (ref 8.4–10.5)
Chloride: 102 mEq/L (ref 96–112)
GFR calc Af Amer: 79 mL/min — ABNORMAL LOW (ref >90)
GFR calc non Af Amer: 68 mL/min — ABNORMAL LOW (ref >90)
Glucose, Bld: 164 mg/dL — ABNORMAL HIGH (ref 70–99)
Potassium: 4.3 mEq/L (ref 3.7–5.3)
SODIUM: 140 meq/L (ref 137–147)
TOTAL PROTEIN: 7.6 g/dL (ref 6.0–8.3)
Total Bilirubin: 0.3 mg/dL (ref 0.3–1.2)

## 2013-06-28 LAB — LACTIC ACID, PLASMA: Lactic Acid, Venous: 2 mmol/L (ref 0.5–2.2)

## 2013-06-28 LAB — PRO B NATRIURETIC PEPTIDE: Pro B Natriuretic peptide (BNP): 1400 pg/mL — ABNORMAL HIGH (ref 0–125)

## 2013-06-28 LAB — AMMONIA: Ammonia: 23 umol/L (ref 11–60)

## 2013-06-28 MED ORDER — LEVETIRACETAM 500 MG PO TABS: 500.0000 mg | ORAL_TABLET | Freq: Two times a day (BID) | ORAL | Status: DC

## 2013-06-28 MED ORDER — LEVETIRACETAM 500 MG PO TABS
500.0000 mg | ORAL_TABLET | Freq: Once | ORAL | Status: AC
  Administered 2013-06-28: 500 mg via ORAL
  Filled 2013-06-28: qty 1

## 2013-06-28 NOTE — ED Notes (Signed)
Patient transported to X-ray 

## 2013-06-28 NOTE — ED Provider Notes (Signed)
4:50 PM Dr. Cyril Mourning w/ neuro has seen pt, believe episode was likely seizure. Will start on keppra 500mg  BID and will have him f/u with neurology as outpt. Return precautions given for new or worsening symptoms including focal neuro complaints, fever, CP, SOB.   1. Seizure       Shanna Cisco, MD 06/28/13 1650

## 2013-06-28 NOTE — ED Notes (Signed)
Rulon Eisenmenger, lab tech at bedside for blood draw.

## 2013-06-28 NOTE — ED Notes (Signed)
To room via EMS.  Per EMS wife told them pt was outside on porch smoking, when he came back in house, he yelled for wife, when wife went to room and pt was asleep in wheelchair.  He opened eyes to her voice but returned to sleep.  EMS stated pt stayed like that until 5 minutes PTA when pt opened eyes spontaneously and was answering questions.  Pt alert to person, place, not situation, time.

## 2013-06-28 NOTE — ED Notes (Signed)
Family at bedside. 

## 2013-06-28 NOTE — ED Notes (Signed)
Pts wife and son at bedside.  Pt states when she found pt he was slumped over on right side, normally slumps on left side.  Pt did not respond to voice or touch.  Wife checked BS 313, BP 88/38, HR 55.  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  MD at bedside.

## 2013-06-28 NOTE — ED Notes (Signed)
Dr.Camilo at bedside  

## 2013-06-28 NOTE — ED Provider Notes (Signed)
CSN: [id number]     Arrival date & time 06/28/13  1319 History   First MD Initiated Contact with Patient 06/28/13 1325     Chief Complaint  Patient presents with  . Altered Mental Status      HPI To room via EMS. Per EMS wife told them pt was outside on porch smoking, when he came back in house, he yelled for wife, when wife went to room and pt was asleep in wheelchair. He opened eyes to her voice but returned to sleep. EMS stated pt stayed like that until 5 minutes PTA when pt opened eyes spontaneously and was answering questions. Pt alert to person, place, not situation, time.   Past Medical History  Diagnosis Date  . 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 Automatic implantable cardiac defibrillator in situ   . Congestive heart failure, unspecified   . CVA (cerebral vascular accident)   . Retention of urine, unspecified   . Pneumonia, organism unspecified   . Cough   . Acute bronchitis   . Contusion of foot   . Other sign and symptom in breast   . Fibromyalgia   . Hyperlipidemia   . Coronary atherosclerosis of unspecified type of vessel, native or graft   . Esophageal reflux   . Type I (juvenile type) diabetes mellitus without mention of complication, uncontrolled   . Essential hypertension, benign   . Cocaine abuse   . Alcohol abuse   . Microalbuminuria   . GERD (gastroesophageal reflux disease)   . Arteriovenous malformation (AVM)     of colon  . Diverticulosis   . GI bleed     secondary to avm   Past Surgical History  Procedure Laterality Date  . Back surgery      X 2  . Tonsillectomy    . Cardiac defibrillator placement     Family History  Problem Relation Age of Onset  . Heart failure Mother   . Diabetes Sister   . Colon cancer Brother     questionable  . Prostate cancer Neg Hx    History  Substance Use Topics  . Smoking status: Current Every Day Smoker -- 2.00 packs/day for 55 years    Types: Cigarettes  . Smokeless tobacco:  Never Used     Comment: down to 6 cigarettes a day  . Alcohol Use: No     Comment: quit at the time of stroke    Review of Systems  All other systems reviewed and are negative.     Allergies  Metformin  Home Medications   Prior to Admission medications   Medication Sig Start Date End Date Taking? Authorizing Provider  carvedilol (COREG) 25 MG tablet Take 0.5 tablets (12.5 mg total) by mouth 2 (two) times daily with a meal. 03/04/13  Yes Neena Rhymes, MD  cloNIDine (CATAPRES) 0.2 MG tablet Take 1 tablet (0.2 mg total) by mouth 2 (two) times daily. 03/14/13  Yes Neena Rhymes, MD  finasteride (PROSCAR) 5 MG tablet Take 1 tablet (5 mg total) by mouth daily. 03/14/13  Yes Neena Rhymes, MD  furosemide (LASIX) 40 MG tablet Take 1 tablet (40 mg total) by mouth daily. 03/04/13  Yes Neena Rhymes, MD  hydrALAZINE (APRESOLINE) 25 MG tablet Take 1 tablet (25 mg total) by mouth 3 (three) times daily. 03/04/13  Yes Neena Rhymes, MD  insulin NPH Human (HUMULIN N,NOVOLIN N) 100 UNIT/ML injection Inject 30 Units into the skin daily  before breakfast. 03/26/13  Yes Jacques Navy, MD  OVER THE COUNTER MEDICATION Take 1 tablet by mouth 2 (two) times a week. Vegetable Laxative OTC   Yes Historical Provider, MD  potassium chloride SA (K-DUR,KLOR-CON) 20 MEQ tablet Take 1 tablet (20 mEq total) by mouth 2 (two) times daily. 03/04/13  Yes Jacques Navy, MD  sertraline (ZOLOFT) 50 MG tablet Take 1 tablet (50 mg total) by mouth daily. 03/04/13  Yes Jacques Navy, MD  simvastatin (ZOCOR) 40 MG tablet Take 1 tablet (40 mg total) by mouth at bedtime. 03/04/13  Yes Jacques Navy, MD  tamsulosin (FLOMAX) 0.4 MG CAPS capsule Take 1 capsule (0.4 mg total) by mouth daily. 03/04/13  Yes Jacques Navy, MD  traMADol (ULTRAM) 50 MG tablet Take 50 mg by mouth every 8 (eight) hours as needed (pain).   Yes Historical Provider, MD  valsartan (DIOVAN) 320 MG tablet Take 1 tablet (320 mg total) by mouth  daily. 03/04/13  Yes Jacques Navy, MD  Alcohol Swabs (B-D SINGLE USE SWABS REGULAR) PADS Use as directed dx 250.03 03/26/13   Jacques Navy, MD  Blood Glucose Calibration (ACCU-CHEK AVIVA) SOLN Use as directed Dx 250.03 03/26/13   Jacques Navy, MD  Blood Glucose Monitoring Suppl (ACCU-CHEK AVIVA PLUS) W/DEVICE KIT Use as directed 03/26/13   Jacques Navy, MD  glucose blood (ACCU-CHEK AVIVA PLUS) test strip Use as instructed Dx 250.03 03/26/13   Jacques Navy, MD  Insulin Pen Needle (B-D UF III MINI PEN NEEDLES) 31G X 5 MM MISC Use as directed 04/07/13   Jacques Navy, MD  Lancet Devices [facility]) lancets Use as instructed Dx 250.03 03/26/13   Jacques Navy, MD   BP 165/65  Pulse 57  Temp(Src) 97.4 F (36.3 C) (Oral)  Resp 15  SpO2 98% Physical Exam  Nursing note and vitals reviewed. Constitutional: He is oriented to person, place, and time. He appears well-developed and well-nourished. No distress.  HENT:  Head: Normocephalic and atraumatic.  Eyes: Pupils are equal, round, and reactive to light.  Neck: Normal range of motion.  Cardiovascular: Normal rate and intact distal pulses.   Pulmonary/Chest: No respiratory distress.  Abdominal: Normal appearance. He exhibits no distension.  Musculoskeletal: Normal range of motion.  Neurological: He is alert and oriented to person, place, and time. No cranial nerve deficit.  Skin: Skin is warm and dry. No rash noted.  Psychiatric: He has a normal mood and affect. His behavior is normal.    ED Course  Procedures (including critical care time) Labs Review Labs Reviewed  COMPREHENSIVE METABOLIC PANEL - Abnormal; Notable for the following:    Glucose, Bld 164 (*)    GFR calc non Af Amer 68 (*)    GFR calc Af Amer 79 (*)    All other components within normal limits  CBC WITH DIFFERENTIAL - Abnormal; Notable for the following:    RBC 3.31 (*)    Hemoglobin 10.7 (*)    HCT 32.5 (*)    Neutrophils Relative % 80 (*)     All other components within normal limits  PRO B NATRIURETIC PEPTIDE - Abnormal; Notable for the following:    Pro B Natriuretic peptide (BNP) 1400.0 (*)    All other components within normal limits  AMMONIA  LACTIC ACID, PLASMA  URINALYSIS, ROUTINE W REFLEX MICROSCOPIC    Imaging Review Dg Chest 2 View  06/28/2013   CLINICAL DATA:  Altered level of consciousness  EXAM: CHEST  2 VIEW  COMPARISON:  12/17/2012  FINDINGS: Cardiomegaly again noted. Single lead cardiac pacemaker is unchanged in position. No acute infiltrate or pulmonary edema. Mild left basilar atelectasis. Atherosclerotic calcifications of thoracic aorta. Degenerative changes thoracic spine. Extensive degenerative changes bilateral shoulders.  IMPRESSION: No acute infiltrate or pulmonary edema. Mild left basilar atelectasis.   Electronically Signed   By: Lahoma Crocker M.D.   On: 06/28/2013 14:22   Ct Head Wo Contrast  06/28/2013   CLINICAL DATA:  Headache  EXAM: CT HEAD WITHOUT CONTRAST  TECHNIQUE: Contiguous axial images were obtained from the base of the skull through the vertex without intravenous contrast.  COMPARISON:  12/14/2012  FINDINGS: No skull fracture is noted. Paranasal sinuses and mastoid air cells are unremarkable. Atherosclerotic calcifications of carotid siphon are noted. Stable cerebral atrophy. Stable bilateral basal ganglia old infarcts. No acute cortical infarction. No mass lesion is noted on this unenhanced scan. No intracranial hemorrhage, mass effect or midline shift. Stable periventricular and subcortical chronic white matter disease.  IMPRESSION: No acute intracranial abnormality. Stable atrophy and chronic white matter disease. No definite acute cortical infarction.   Electronically Signed   By: Lahoma Crocker M.D.   On: 06/28/2013 14:42     EKG Interpretation   Date/Time:  Saturday Jun 28 2013 13:23:20 EDT Ventricular Rate:  56 PR Interval:  294 QRS Duration: 165 QT Interval:  605 QTC Calculation: 584 R  Axis:   -75 Text Interpretation:  Sinus rhythm Prolonged PR interval RBBB and LAFB No  significant change since last tracing Confirmed by Katreena Schupp  MD, Sarae Nicholes  ([id number]) on 06/28/2013 2:48:10 PM     According to the same laying the patient is at his baseline.  Reviewing records revealed similar type incident last year.  Thought it might be seizure activity.  No definite seizure activity seen by his wife.  Plan review labs and consult neurology.  Discharge as per neurology's recommendations. MDM   Final diagnoses:  Seizure        Dot Lanes, MD 06/29/13 [phone]

## 2013-06-28 NOTE — Discharge Instructions (Signed)
Seizure, Adult °A seizure is abnormal electrical activity in the brain. Seizures usually last from 30 seconds to 2 minutes. There are various types of seizures. °Before a seizure, you may have a warning sensation (aura) that a seizure is about to occur. An aura may include the following symptoms:  °· Fear or anxiety. °· Nausea. °· Feeling like the room is spinning (vertigo). °· Vision changes, such as seeing flashing lights or spots. °Common symptoms during a seizure include: °· A change in attention or behavior (altered mental status). °· Convulsions with rhythmic jerking movements. °· Drooling. °· Rapid eye movements. °· Grunting. °· Loss of bladder and bowel control. °· Bitter taste in the mouth. °· Tongue biting. °After a seizure, you may feel confused and sleepy. You may also have an injury resulting from convulsions during the seizure. °HOME CARE INSTRUCTIONS  °· If you are given medicines, take them exactly as prescribed by your health care provider. °· Keep all follow-up appointments as directed by your health care provider. °· Do not swim or drive or engage in risky activity during which a seizure could cause further injury to you or others until your health care provider says it is OK. °· Get adequate rest. °· Teach friends and family what to do if you have a seizure. They should: °· Lay you on the ground to prevent a fall. °· Put a cushion under your head. °· Loosen any tight clothing around your neck. °· Turn you on your side. If vomiting occurs, this helps keep your airway clear. °· Stay with you until you recover. °· Know whether or not you need emergency care. °SEEK IMMEDIATE MEDICAL CARE IF: °· The seizure lasts longer than 5 minutes. °· The seizure is severe or you do not wake up immediately after the seizure. °· You have an altered mental status after the seizure. °· You are having more frequent or worsening seizures. °Someone should drive you to the emergency department or call local emergency  services (911 in [place] °MAKE SURE YOU: °· Understand these instructions. °· Will watch your condition. °· Will get help right away if you are not doing well or get worse. °Document Released: 01/21/2000 Document Revised: 11/13/2012 Document Reviewed: 09/04/2012 °[employer]® Patient Information ©2014 [employer]. ° °Driving and Equipment Restrictions °Some medical problems make it dangerous to drive, ride a bike, or use machines. Some of these problems are: °· A hard blow to the head (concussion). °· Passing out (fainting). °· Twitching and shaking (seizures). °· Low blood sugar. °· Taking medicine to help you relax (sedatives). °· Taking pain medicines. °· Wearing an eye patch. °· Wearing splints. This can make it hard to use parts of your body that you need to drive safely. °HOME CARE  °· Do not drive until your doctor says it is okay. °· Do not use machines until your doctor says it is okay. °You may need a form signed by your doctor (medical release) before you can drive again. You may also need this form before you do other tasks where you need to be fully alert. °MAKE SURE YOU: °· Understand these instructions. °· Will watch your condition. °· Will get help right away if you are not doing well or get worse. °Document Released: 03/02/2004 Document Revised: 04/17/2011 Document Reviewed: 06/02/2009 °[employer]® Patient Information ©2014 [employer]. ° °

## 2013-06-28 NOTE — Consult Note (Signed)
NEURO HOSPITALIST CONSULT NOTE    Reason for Consult: transient unresponsiveness  HPI:                                                                                                                                          [name] is an 73 y.o. 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 Wife indicated that her husband wason porch smoking, when he came back in house, he yelled for wife, when wife went to room and pt was unresponsive, slumped over onthe right in his wheelchair. She said that he was fumbling and trying to reach with his left hand, then open his eye but was staring in the space, no able to follow instructions.Wife checked BS 313, BP 88/38, HR 55. EMS was called and stated pt stayed like that until 5 minutes PTA when pt opened eyes spontaneously and was answering questions. By the time hen was examined in the ED he was described as back to his baseline. CT brain showed no acute abnormality.  Serologies including ammonia, and UA are unimpressive. At this time, he is alert, awake, and following commands. Importantly, family reports that this is the third episode of sudden onset of decreased responsiveness and that the 2 prior events were associated with uncontrollable but brief jerking movements. No recent fever, infections, falls.     Past Medical History  Diagnosis Date  . 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 Automatic implantable cardiac defibrillator in situ   . Congestive heart failure, unspecified   . CVA (cerebral vascular accident)   . Retention of urine, unspecified   . Pneumonia, organism unspecified   . Cough   . Acute bronchitis   . Contusion of foot   . Other sign and symptom  in breast   . Fibromyalgia   . Hyperlipidemia   . Coronary atherosclerosis of unspecified type of vessel, native or graft   . Esophageal reflux   . Type I (juvenile type) diabetes mellitus without mention of complication, uncontrolled   . Essential hypertension, benign   . Cocaine abuse   . Alcohol abuse   . Microalbuminuria   . GERD (gastroesophageal reflux disease)   . Arteriovenous malformation (AVM)     of colon  . Diverticulosis   . GI bleed     secondary to avm    Past Surgical History  Procedure Laterality Date  . Back surgery      X 2  . Tonsillectomy    . Cardiac defibrillator placement      Family History  Problem  Relation Age of Onset  . Heart failure Mother   . Diabetes Sister   . Colon cancer Brother     questionable  . Prostate cancer Neg Hx     Social History:  reports that he has been smoking Cigarettes.  He has a 110 pack-year smoking history. He has never used smokeless tobacco. He reports that he does not drink alcohol or use illicit drugs.  Allergies  Allergen Reactions  . Metformin Other (See Comments)    REACTION: diarrhea    MEDICATIONS:                                                                                                                     I have reviewed the patient's current medications.   ROS:                                                                                                                                       History obtained from chart review  and family  General ROS: negative for - chills, fatigue, fever, night sweats, or weight loss Psychological ROS: negative for - behavioral disorder, hallucinations, mood swings or suicidal ideation Ophthalmic ROS: negative for - blurry vision, double vision, eye pain or loss of vision ENT ROS: negative for - epistaxis, nasal discharge, oral lesions, sore throat, tinnitus or vertigo Allergy and Immunology ROS: negative for - hives or itchy/watery eyes Hematological and  Lymphatic ROS: negative for - bleeding problems, bruising or swollen lymph nodes Endocrine ROS: negative for - galactorrhea, hair pattern changes, polydipsia/polyuria or temperature intolerance Respiratory ROS: negative for - cough, hemoptysis, shortness of breath or wheezing Cardiovascular ROS: negative for - chest pain, dyspnea on exertion, edema or irregular heartbeat Gastrointestinal ROS: negative for - abdominal pain, diarrhea, hematemesis, nausea/vomiting or stool incontinence Genito-Urinary ROS: significant for urine incontinence.  Musculoskeletal ROS: negative for - joint swelling  Neurological ROS: as noted in HPI Dermatological ROS: negative for rash and skin lesion changes  Physical exam: pleasant male in no apparent distress. Blood pressure 157/57, pulse 50, temperature 97.4 F (36.3 C), temperature source Oral, resp. rate 12, SpO2 100.00%. Head: normocephalic. Neck: supple, no bruits, no JVD. Cardiac: no murmurs. Lungs: clear. Abdomen: soft, no tender, no mass. Extremities: bilateral LE pitting edema.  Neurologic Examination:  Mental Status: Alert, awake, oriented, thought content appropriate.  Speech fluent without evidence of aphasia.  Able to follow 3 step commands without difficulty. Cranial Nerves: II: Discs flat bilaterally; Visual fields grossly normal, pupils equal, round, reactive to light and accommodation III,IV, VI: ptosis not present, extra-ocular motions intact bilaterally V,VII: smile asymmetric due to mild right face weakness, facial light touch sensation normal bilaterally VIII: hearing normal bilaterally IX,X: gag reflex present XI: bilateral shoulder shrug XII: midline tongue extension without atrophy or fasciculations Motor: Dense right hemiplegia Tone increased in the right side. Sensory: Pinprick and light touch intact throughout, bilaterally Deep  Tendon Reflexes:  Brisker in the right side. Plantars: Right: upgoing   Left: downgoing Cerebellar: No tested Gait: Unable to test.    Lab Results  Component Value Date/Time   CHOL 123 07/15/2010  4:25 PM    Results for orders placed during the hospital encounter of 06/28/13 (from the past 48 hour(s))  COMPREHENSIVE METABOLIC PANEL     Status: Abnormal   Collection Time    06/28/13  2:40 PM      Result Value Ref Range   Sodium 140  137 - 147 mEq/L   Potassium 4.3  3.7 - 5.3 mEq/L   Chloride 102  96 - 112 mEq/L   CO2 26  19 - 32 mEq/L   Glucose, Bld 164 (*) 70 - 99 mg/dL   BUN 15  6 - 23 mg/dL   Creatinine, Ser 1.05  0.50 - 1.35 mg/dL   Calcium 9.0  8.4 - 10.5 mg/dL   Total Protein 7.6  6.0 - 8.3 g/dL   Albumin 3.5  3.5 - 5.2 g/dL   AST 12  0 - 37 U/L   ALT 8  0 - 53 U/L   Alkaline Phosphatase 76  39 - 117 U/L   Total Bilirubin 0.3  0.3 - 1.2 mg/dL   GFR calc non Af Amer 68 (*) >90 mL/min   GFR calc Af Amer 79 (*) >90 mL/min   Comment: (NOTE)     The eGFR has been calculated using the CKD EPI equation.     This calculation has not been validated in all clinical situations.     eGFR's persistently <90 mL/min signify possible Chronic Kidney     Disease.  AMMONIA     Status: None   Collection Time    06/28/13  2:40 PM      Result Value Ref Range   Ammonia 23  11 - 60 umol/L  CBC WITH DIFFERENTIAL     Status: Abnormal   Collection Time    06/28/13  2:40 PM      Result Value Ref Range   WBC 9.0  4.0 - 10.5 K/uL   RBC 3.31 (*) 4.22 - 5.81 MIL/uL   Hemoglobin 10.7 (*) 13.0 - 17.0 g/dL   HCT 32.5 (*) 39.0 - 52.0 %   MCV 98.2  78.0 - 100.0 fL   MCH 32.3  26.0 - 34.0 pg   MCHC 32.9  30.0 - 36.0 g/dL   RDW 13.2  11.5 - 15.5 %   Platelets 263  150 - 400 K/uL   Neutrophils Relative % 80 (*) 43 - 77 %   Neutro Abs 7.1  1.7 - 7.7 K/uL   Lymphocytes Relative 12  12 - 46 %   Lymphs Abs 1.1  0.7 - 4.0 K/uL   Monocytes Relative 7  3 - 12 %   Monocytes Absolute 0.7  0.1 -  1.0 K/uL   Eosinophils Relative 1  0 - 5 %   Eosinophils Absolute 0.1  0.0 - 0.7 K/uL   Basophils Relative 0  0 - 1 %   Basophils Absolute 0.0  0.0 - 0.1 K/uL  PRO B NATRIURETIC PEPTIDE     Status: Abnormal   Collection Time    06/28/13  2:40 PM      Result Value Ref Range   Pro B Natriuretic peptide (BNP) 1400.0 (*) 0 - 125 pg/mL  URINALYSIS, ROUTINE W REFLEX MICROSCOPIC     Status: None   Collection Time    06/28/13  2:44 PM      Result Value Ref Range   Color, Urine YELLOW  YELLOW   APPearance CLEAR  CLEAR   Specific Gravity, Urine 1.012  1.005 - 1.030   pH 5.0  5.0 - 8.0   Glucose, UA NEGATIVE  NEGATIVE mg/dL   Hgb urine dipstick NEGATIVE  NEGATIVE   Bilirubin Urine NEGATIVE  NEGATIVE   Ketones, ur NEGATIVE  NEGATIVE mg/dL   Protein, ur NEGATIVE  NEGATIVE mg/dL   Urobilinogen, UA 1.0  0.0 - 1.0 mg/dL   Nitrite NEGATIVE  NEGATIVE   Leukocytes, UA NEGATIVE  NEGATIVE   Comment: MICROSCOPIC NOT DONE ON URINES WITH NEGATIVE PROTEIN, BLOOD, LEUKOCYTES, NITRITE, OR GLUCOSE <1000 mg/dL.    Dg Chest 2 View  06/28/2013   CLINICAL DATA:  Altered level of consciousness  EXAM: CHEST  2 VIEW  COMPARISON:  12/17/2012  FINDINGS: Cardiomegaly again noted. Single lead cardiac pacemaker is unchanged in position. No acute infiltrate or pulmonary edema. Mild left basilar atelectasis. Atherosclerotic calcifications of thoracic aorta. Degenerative changes thoracic spine. Extensive degenerative changes bilateral shoulders.  IMPRESSION: No acute infiltrate or pulmonary edema. Mild left basilar atelectasis.   Electronically Signed   By: Lahoma Crocker M.D.   On: 06/28/2013 14:22   Ct Head Wo Contrast  06/28/2013   CLINICAL DATA:  Headache  EXAM: CT HEAD WITHOUT CONTRAST  TECHNIQUE: Contiguous axial images were obtained from the base of the skull through the vertex without intravenous contrast.  COMPARISON:  12/14/2012  FINDINGS: No skull fracture is noted. Paranasal sinuses and mastoid air cells are  unremarkable. Atherosclerotic calcifications of carotid siphon are noted. Stable cerebral atrophy. Stable bilateral basal ganglia old infarcts. No acute cortical infarction. No mass lesion is noted on this unenhanced scan. No intracranial hemorrhage, mass effect or midline shift. Stable periventricular and subcortical chronic white matter disease.  IMPRESSION: No acute intracranial abnormality. Stable atrophy and chronic white matter disease. No definite acute cortical infarction.   Electronically Signed   By: Lahoma Crocker M.D.   On: 06/28/2013 14:42   Assessment/Plan: 73 y/o with prior stroke with residual right hemiplegia, brought in after sustaining a transient episode of alteration of consciousness with the patten described above. Of note, this is episode # 3, the last one November/14 associated with jerking movements. He was apparently hypotensive with this last episode today. Serologies, UA, and CT brain unrevealing. The episode had a short duration, and I am more concerned with a partial onset seizures than a TIA. Will suggest starting him empirically on keppra 500 mg BID with further dose adjustment and monitoring by neurology as outpatient.  Dorian Pod, MD 06/28/2013, 3:45 PM [facility]

## 2013-07-07 ENCOUNTER — Encounter: Payer: Self-pay | Admitting: Cardiology

## 2013-07-08 ENCOUNTER — Telehealth: Payer: Self-pay | Admitting: Internal Medicine

## 2013-07-08 NOTE — Telephone Encounter (Signed)
05-21-13 left message with caregiver to have pt's wife call to set up past due device ck/mt 07-07-13 sent past due device check certified letter/mt

## 2013-08-07 ENCOUNTER — Encounter: Payer: Self-pay | Admitting: Internal Medicine

## 2013-08-07 ENCOUNTER — Ambulatory Visit (INDEPENDENT_AMBULATORY_CARE_PROVIDER_SITE_OTHER): Payer: Medicare HMO | Admitting: Internal Medicine

## 2013-08-07 VITALS — BP 104/62 | HR 73 | Temp 98.3°F

## 2013-08-07 DIAGNOSIS — N320 Bladder-neck obstruction: Secondary | ICD-10-CM

## 2013-08-07 DIAGNOSIS — I10 Essential (primary) hypertension: Secondary | ICD-10-CM

## 2013-08-07 DIAGNOSIS — E1065 Type 1 diabetes mellitus with hyperglycemia: Secondary | ICD-10-CM

## 2013-08-07 DIAGNOSIS — G40909 Epilepsy, unspecified, not intractable, without status epilepticus: Secondary | ICD-10-CM

## 2013-08-07 DIAGNOSIS — IMO0002 Reserved for concepts with insufficient information to code with codable children: Secondary | ICD-10-CM

## 2013-08-07 DIAGNOSIS — E785 Hyperlipidemia, unspecified: Secondary | ICD-10-CM

## 2013-08-07 MED ORDER — INSULIN NPH (HUMAN) (ISOPHANE) 100 UNIT/ML ~~LOC~~ SUSP: 30.0000 [IU] | Freq: Every day | SUBCUTANEOUS | Status: DC

## 2013-08-07 NOTE — Progress Notes (Signed)
Pre visit review using our clinic review tool, if applicable. No additional management support is needed unless otherwise documented below in the visit note. 

## 2013-08-07 NOTE — Progress Notes (Signed)
Subjective:    Patient ID: [name], male    DOB: December 30, 1940, 73 y.o.   MRN: [id number]  HPI  Here with supportive wife to establish new PCP, and gives all hx as pt is noncommunicative, was recently hospd with new siezure, started on keppra.  Wife states 2 more episodes "dead stare" since 07-24-22- no falls or shaking.  Wife states does not like the new keppra, thinks it may make him sleep more and irregularly, seems somewhat more confuse than prior to med, though not clear due to hx of dementia.  No planned neuro f/u at this time.   Wife denies polydipsia, polyuria, or low sugar symptoms such as weakness or confusion improved with po intake. Overall good compliance with treatment.  Wife states he does not eat well, small amounts for bfast, lunch and half sandwich for dinner, wont eat vegetables.  She gets him to eat in between meals, and wont eat boost well.  States she thinks he's lost wt, not clear how much.  Does like sweets overall.    CBG's low 100's.  No change in chronic right hemiparesis and chronic right > left ankle edema Past Medical History  Diagnosis Date  . 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 Automatic implantable cardiac defibrillator in situ   . Congestive heart failure, unspecified   . CVA (cerebral vascular accident)   . Retention of urine, unspecified   . Pneumonia, organism unspecified   . Cough   . Acute bronchitis   . Contusion of foot   . Other sign and symptom in breast   . Fibromyalgia   . Hyperlipidemia   . Coronary atherosclerosis of unspecified type of vessel, native or graft   . Esophageal reflux   . Type I (juvenile type) diabetes mellitus without mention of complication, uncontrolled   . Essential hypertension, benign   . Cocaine abuse   . Alcohol abuse   . Microalbuminuria   . GERD (gastroesophageal reflux disease)   . Arteriovenous malformation (AVM)     of colon  . Diverticulosis   . GI bleed     secondary to avm   Past  Surgical History  Procedure Laterality Date  . Back surgery      X 2  . Tonsillectomy    . Cardiac defibrillator placement      reports that he has been smoking Cigarettes.  He has a 110 pack-year smoking history. He has never used smokeless tobacco. He reports that he does not drink alcohol or use illicit drugs. family history includes Colon cancer in his brother; Diabetes in his sister; Heart failure in his mother. There is no history of Prostate cancer. Allergies  Allergen Reactions  . Metformin Other (See Comments)    REACTION: diarrhea   Current Outpatient Prescriptions on File Prior to Visit  Medication Sig Dispense Refill  . Alcohol Swabs (B-D SINGLE USE SWABS REGULAR) PADS Use as directed dx 250.03  100 each  3  . Blood Glucose Calibration (ACCU-CHEK AVIVA) SOLN Use as directed Dx 250.03  3 each  3  . Blood Glucose Monitoring Suppl (ACCU-CHEK AVIVA PLUS) W/DEVICE KIT Use as directed  1 kit  0  . carvedilol (COREG) 25 MG tablet Take 0.5 tablets (12.5 mg total) by mouth 2 (two) times daily with a meal.  90 tablet  3  . cloNIDine (CATAPRES) 0.2 MG tablet Take 1 tablet (0.2 mg total) by mouth 2 (two) times daily.  28 tablet  0  . finasteride (PROSCAR) 5 MG tablet Take 1 tablet (5 mg total) by mouth daily.  14 tablet  0  . furosemide (LASIX) 40 MG tablet Take 1 tablet (40 mg total) by mouth daily.  90 tablet  3  . glucose blood (ACCU-CHEK AVIVA PLUS) test strip Use as instructed Dx 250.03  100 each  3  . hydrALAZINE (APRESOLINE) 25 MG tablet Take 1 tablet (25 mg total) by mouth 3 (three) times daily.  270 tablet  3  . Insulin Pen Needle (B-D UF III MINI PEN NEEDLES) 31G X 5 MM MISC Use as directed  100 each  3  . Lancet Devices (ACCU-CHEK SOFTCLIX) lancets Use as instructed Dx 250.03  3 each  3  . levETIRAcetam (KEPPRA) 500 MG tablet Take 1 tablet (500 mg total) by mouth 2 (two) times daily.  60 tablet  0  . potassium chloride SA (K-DUR,KLOR-CON) 20 MEQ tablet Take 1 tablet (20 mEq  total) by mouth 2 (two) times daily.  180 tablet  3  . sertraline (ZOLOFT) 50 MG tablet Take 1 tablet (50 mg total) by mouth daily.  90 tablet  3  . simvastatin (ZOCOR) 40 MG tablet Take 1 tablet (40 mg total) by mouth at bedtime.  90 tablet  3  . tamsulosin (FLOMAX) 0.4 MG CAPS capsule Take 1 capsule (0.4 mg total) by mouth daily.  90 capsule  3  . traMADol (ULTRAM) 50 MG tablet Take 50 mg by mouth every 8 (eight) hours as needed (pain).      . valsartan (DIOVAN) 320 MG tablet Take 1 tablet (320 mg total) by mouth daily.  90 tablet  3   No current facility-administered medications on file prior to visit.     Review of Systems Unable due to pt noncommunicative    Objective:   Physical Exam BP 104/62  Pulse 73  Temp(Src) 98.3 F (36.8 C) (Oral)  SpO2 94% VS noted, seated in motorized wheelchair,  Constitutional: Pt appears mild obese, chronic ill, sways somewhat in chair , does not respond to questions but will respond to request for exam and deep breathing.  HENT: Head: NCAT.  Right Ear: External ear normal.  Left Ear: External ear normal.  Eyes: . Pupils are equal, round, and reactive to light. Conjunctivae and EOM are normal Neck: Normal range of motion. Neck supple.  Cardiovascular: Normal rate and regular rhythm.   Pulmonary/Chest: Effort normal and breath sounds normal.  Abd:  Soft, NT, ND, + BS Neurological: Pt is alert. Not confused , motor with right hemiparesis Skin: Skin is warm. No rash, right > left trace -1+ edema Psychiatric: Pt behavior is normal. No agitation.     Assessment & Plan:   

## 2013-08-07 NOTE — Patient Instructions (Signed)
Please continue all other medications as before, and refills have been done if requested.  Please have the pharmacy call with any other refills you may need.  Please continue your efforts at being more active, low cholesterol diet, and weight control.  You are otherwise up to date with prevention measures today.  Please keep your appointments with your specialists as you may have planned  You will be contacted regarding the referral for: Neurology  Please go to the LAB in the Basement (turn left off the elevator) for the tests to be done today  You will be contacted by phone if any changes need to be made immediately.  Otherwise, you will receive a letter about your results with an explanation, but please check with MyChart first.  Please remember to sign up for MyChart if you have not done so, as this will be important to you in the future with finding out test results, communicating by private email, and scheduling acute appointments online when needed.  Please return in 6 months, or sooner if needed

## 2013-08-08 DIAGNOSIS — G40909 Epilepsy, unspecified, not intractable, without status epilepticus: Secondary | ICD-10-CM | POA: Insufficient documentation

## 2013-08-08 NOTE — Assessment & Plan Note (Signed)
stable overall by history and exam, recent data reviewed with pt, and pt to continue medical treatment as before,  to f/u any worsening symptoms or concerns BP Readings from Last 3 Encounters:  08/07/13 104/62  06/28/13 165/65  04/22/13 100/50

## 2013-08-08 NOTE — Assessment & Plan Note (Signed)
stable overall by history and exam, recent data reviewed with pt, and pt to continue medical treatment as before,  to f/u any worsening symptoms or concerns Lab Results  Component Value Date   LDLCALC 59 07/15/2010

## 2013-08-08 NOTE — Assessment & Plan Note (Signed)
For keppra level, refer neurology

## 2013-08-08 NOTE — Assessment & Plan Note (Signed)
stable overall by history and exam, recent data reviewed with pt, and pt to continue medical treatment as before,  to f/u any worsening symptoms or concerns Lab Results  Component Value Date   HGBA1C 7.1* 12/15/2012   For f/u lab

## 2013-08-11 ENCOUNTER — Other Ambulatory Visit: Payer: Self-pay

## 2013-08-11 MED ORDER — LEVETIRACETAM 500 MG PO TABS: 500.0000 mg | ORAL_TABLET | Freq: Two times a day (BID) | ORAL | Status: DC

## 2013-08-21 ENCOUNTER — Ambulatory Visit (INDEPENDENT_AMBULATORY_CARE_PROVIDER_SITE_OTHER): Payer: Medicare HMO | Admitting: Neurology

## 2013-08-21 ENCOUNTER — Encounter: Payer: Self-pay | Admitting: Neurology

## 2013-08-21 VITALS — BP 118/60 | HR 59 | Ht 66.0 in | Wt 216.0 lb

## 2013-08-21 DIAGNOSIS — R569 Unspecified convulsions: Secondary | ICD-10-CM

## 2013-08-21 MED ORDER — LEVETIRACETAM ER 500 MG PO TB24: ORAL_TABLET | ORAL | Status: DC

## 2013-08-21 NOTE — Patient Instructions (Signed)
1. Switch to Keppra XR 1000mg  at bedtime 2. Routine EEG 3. Follow-up in 2 months
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
## 2013-08-22 DIAGNOSIS — R569 Unspecified convulsions: Secondary | ICD-10-CM | POA: Insufficient documentation

## 2013-09-05 ENCOUNTER — Telehealth: Payer: Self-pay | Admitting: Internal Medicine

## 2013-09-05 ENCOUNTER — Emergency Department (HOSPITAL_COMMUNITY): Payer: Medicare HMO

## 2013-09-05 ENCOUNTER — Encounter (HOSPITAL_COMMUNITY): Payer: Self-pay | Admitting: Emergency Medicine

## 2013-09-05 ENCOUNTER — Emergency Department (HOSPITAL_COMMUNITY)
Admission: EM | Admit: 2013-09-05 | Discharge: 2013-09-06 | Disposition: A | Discharge status: Home / Self Care | Payer: Medicare HMO | Attending: Emergency Medicine | Admitting: Emergency Medicine

## 2013-09-05 DIAGNOSIS — I10 Essential (primary) hypertension: Secondary | ICD-10-CM | POA: Diagnosis not present

## 2013-09-05 DIAGNOSIS — R5381 Other malaise: Secondary | ICD-10-CM | POA: Diagnosis present

## 2013-09-05 DIAGNOSIS — R5383 Other fatigue: Principal | ICD-10-CM

## 2013-09-05 DIAGNOSIS — Z8709 Personal history of other diseases of the respiratory system: Secondary | ICD-10-CM | POA: Insufficient documentation

## 2013-09-05 DIAGNOSIS — F17200 Nicotine dependence, unspecified, uncomplicated: Secondary | ICD-10-CM | POA: Insufficient documentation

## 2013-09-05 DIAGNOSIS — Z87828 Personal history of other (healed) physical injury and trauma: Secondary | ICD-10-CM | POA: Diagnosis not present

## 2013-09-05 DIAGNOSIS — E1065 Type 1 diabetes mellitus with hyperglycemia: Secondary | ICD-10-CM | POA: Diagnosis not present

## 2013-09-05 DIAGNOSIS — I509 Heart failure, unspecified: Secondary | ICD-10-CM | POA: Diagnosis not present

## 2013-09-05 DIAGNOSIS — I2510 Atherosclerotic heart disease of native coronary artery without angina pectoris: Secondary | ICD-10-CM | POA: Diagnosis not present

## 2013-09-05 DIAGNOSIS — Z8673 Personal history of transient ischemic attack (TIA), and cerebral infarction without residual deficits: Secondary | ICD-10-CM | POA: Insufficient documentation

## 2013-09-05 DIAGNOSIS — E785 Hyperlipidemia, unspecified: Secondary | ICD-10-CM | POA: Diagnosis not present

## 2013-09-05 DIAGNOSIS — IMO0002 Reserved for concepts with insufficient information to code with codable children: Secondary | ICD-10-CM | POA: Insufficient documentation

## 2013-09-05 DIAGNOSIS — Z794 Long term (current) use of insulin: Secondary | ICD-10-CM | POA: Insufficient documentation

## 2013-09-05 DIAGNOSIS — Z95810 Presence of automatic (implantable) cardiac defibrillator: Secondary | ICD-10-CM | POA: Insufficient documentation

## 2013-09-05 DIAGNOSIS — Z8701 Personal history of pneumonia (recurrent): Secondary | ICD-10-CM | POA: Diagnosis not present

## 2013-09-05 DIAGNOSIS — Z8719 Personal history of other diseases of the digestive system: Secondary | ICD-10-CM | POA: Diagnosis not present

## 2013-09-05 DIAGNOSIS — Z79899 Other long term (current) drug therapy: Secondary | ICD-10-CM | POA: Diagnosis not present

## 2013-09-05 LAB — BASIC METABOLIC PANEL
ANION GAP: 12 (ref 5–15)
BUN: 27 mg/dL — ABNORMAL HIGH (ref 6–23)
CALCIUM: 8.7 mg/dL (ref 8.4–10.5)
CO2: 24 mEq/L (ref 19–32)
Chloride: 102 mEq/L (ref 96–112)
Creatinine, Ser: 1.21 mg/dL (ref 0.50–1.35)
GFR calc non Af Amer: 58 mL/min — ABNORMAL LOW (ref >90)
GFR, EST AFRICAN AMERICAN: 67 mL/min — AB (ref >90)
GLUCOSE: 149 mg/dL — AB (ref 70–99)
POTASSIUM: 5.1 meq/L (ref 3.7–5.3)
SODIUM: 138 meq/L (ref 137–147)

## 2013-09-05 LAB — CBC WITH DIFFERENTIAL/PLATELET
Basophils Absolute: 0 10*3/uL (ref 0.0–0.1)
Basophils Relative: 0 % (ref 0–1)
EOS ABS: 0.2 10*3/uL (ref 0.0–0.7)
EOS PCT: 2 % (ref 0–5)
HCT: 25.1 % — ABNORMAL LOW (ref 39.0–52.0)
Hemoglobin: 8 g/dL — ABNORMAL LOW (ref 13.0–17.0)
LYMPHS ABS: 2 10*3/uL (ref 0.7–4.0)
Lymphocytes Relative: 30 % (ref 12–46)
MCH: 31.4 pg (ref 26.0–34.0)
MCHC: 31.9 g/dL (ref 30.0–36.0)
MCV: 98.4 fL (ref 78.0–100.0)
Monocytes Absolute: 0.5 10*3/uL (ref 0.1–1.0)
Monocytes Relative: 8 % (ref 3–12)
NEUTROS PCT: 60 % (ref 43–77)
Neutro Abs: 4 10*3/uL (ref 1.7–7.7)
PLATELETS: 159 10*3/uL (ref 150–400)
RBC: 2.55 MIL/uL — AB (ref 4.22–5.81)
RDW: 13.5 % (ref 11.5–15.5)
WBC: 6.7 10*3/uL (ref 4.0–10.5)

## 2013-09-05 LAB — URINALYSIS, ROUTINE W REFLEX MICROSCOPIC
Bilirubin Urine: NEGATIVE
Glucose, UA: NEGATIVE mg/dL
Hgb urine dipstick: NEGATIVE
KETONES UR: NEGATIVE mg/dL
Leukocytes, UA: NEGATIVE
NITRITE: NEGATIVE
PROTEIN: NEGATIVE mg/dL
Specific Gravity, Urine: 1.012 (ref 1.005–1.030)
Urobilinogen, UA: 0.2 mg/dL (ref 0.0–1.0)
pH: 5 (ref 5.0–8.0)

## 2013-09-05 LAB — CBG MONITORING, ED: GLUCOSE-CAPILLARY: 153 mg/dL — AB (ref 70–99)

## 2013-09-05 LAB — TROPONIN I

## 2013-09-05 LAB — POC OCCULT BLOOD, ED: FECAL OCCULT BLD: NEGATIVE

## 2013-09-05 MED ORDER — SODIUM CHLORIDE 0.9 % IV BOLUS (SEPSIS)
1000.0000 mL | Freq: Once | INTRAVENOUS | Status: AC
  Administered 2013-09-05: 1000 mL via INTRAVENOUS

## 2013-09-05 MED ORDER — SODIUM CHLORIDE 0.9 % IV SOLN
INTRAVENOUS | Status: DC
  Administered 2013-09-05: 22:00:00 via INTRAVENOUS

## 2013-09-05 NOTE — ED Notes (Signed)
Duplicate order for EKG

## 2013-09-05 NOTE — ED Notes (Signed)
Pt in with family stating that patient has been weak and not acting like himself for the past week, almost no PO intact for the last few days, pt alert, family reports he is not acting like himself, pt hypotensive in triage

## 2013-09-05 NOTE — ED Notes (Signed)
Discussed with Dr. Effie ShyWentz that patient had retained 400 mL of urine that was drained during in and out cath.  Also that wife states he doesn't have wet briefs for over 12 hours while at home. MD acknowledges, no new orders received.

## 2013-09-05 NOTE — Telephone Encounter (Signed)
Patient Information:  Caller Name: Frank Hashimotoatricia  Phone: [phone]  Patient: [name], Durante  Gender: Male  DOB: 12/09/1940  Age: 73 Years  PCP: [name], [name] (Adults only)  Office Follow Up:  Does the office need to follow up with this patient?: No  Instructions For The Office: N/A  RN Note:  Attempted to reach staff in office prior to sending to ED per office policy but there was no answer on the office back line and was unable to get through to a live person in office from the automated system using the main number with the new automated system.  Symptoms  Reason For Call & Symptoms: Wife states he has had very decreased intake over last several days to a week. Refusing almost all solid food (maybe eating a cup or less per day) and drinking one boost shake per day with only sips of water to swallow meds. Still making urine but decreased from usual. Getting weaker each day. When asked he denies any pain or other sxs.  Reviewed Health History In EMR: Yes  Reviewed Medications In EMR: Yes  Reviewed Allergies In EMR: Yes  Reviewed Surgeries / Procedures: Yes  Date of Onset of Symptoms: 08/29/2013  Guideline(s) Used:  Weakness (Generalized) and Fatigue  Disposition Per Guideline:   Go to ED Now (or to Office with PCP Approval)  Reason For Disposition Reached:   Drinking very little and dehydration suspected (e.g., no urine > 12 hours, very dry mouth, very lightheaded)  Advice Given:  N/A  Patient Will Follow Care Advice:  YES

## 2013-09-05 NOTE — ED Notes (Signed)
Portable x-ray at the bedside.  

## 2013-09-05 NOTE — ED Provider Notes (Signed)
CSN: [id number]     Arrival date & time 09/05/13  1724 History   First MD Initiated Contact with Patient 09/05/13 1848     Chief Complaint  Patient presents with  . Weakness     (Consider location/radiation/quality/duration/timing/severity/associated sxs/prior Treatment) HPI  [name] is a 73 y.o. male who presents for evaluation of weakness, and not eating. This problem has been ongoing for several weeks. He saw his neurologist 2 weeks ago, and at that time. His Keppra was changed from twice a day, 2 at bedtime, to improve his daytime sleepiness. This modification to help with daytime sleepiness. However, he continues to be inactive during the day, and has not been eating well for the last 7-10 days. He has not been coughing, had a fever, vomiting, or change in his bowel habits. He is taking his usual medications, without relief. There are no other known modifying factors.   Past Medical History  Diagnosis Date  . 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 Automatic implantable cardiac defibrillator in situ   . Congestive heart failure, unspecified   . CVA (cerebral vascular accident)   . Retention of urine, unspecified   . Pneumonia, organism unspecified   . Cough   . Acute bronchitis   . Contusion of foot   . Other sign and symptom in breast   . Fibromyalgia   . Hyperlipidemia   . Coronary atherosclerosis of unspecified type of vessel, native or graft   . Esophageal reflux   . Type I (juvenile type) diabetes mellitus without mention of complication, uncontrolled   . Essential hypertension, benign   . Cocaine abuse   . Alcohol abuse   . Microalbuminuria   . GERD (gastroesophageal reflux disease)   . Arteriovenous malformation (AVM)     of colon  . Diverticulosis   . GI bleed     secondary to avm   Past Surgical History  Procedure Laterality Date  . Back surgery      X 2  . Tonsillectomy    . Cardiac defibrillator placement     Family History   Problem Relation Age of Onset  . Heart failure Mother   . Diabetes Sister   . Colon cancer Brother     questionable  . Prostate cancer Neg Hx    History  Substance Use Topics  . Smoking status: Current Every Day Smoker -- 2.00 packs/day for 55 years    Types: Cigarettes  . Smokeless tobacco: Never Used     Comment: down to 6 cigarettes a day  . Alcohol Use: No     Comment: quit at the time of stroke    Review of Systems  All other systems reviewed and are negative.     Allergies  Metformin  Home Medications   Prior to Admission medications   Medication Sig Start Date End Date Taking? Authorizing Provider  carvedilol (COREG) 25 MG tablet Take 0.5 tablets (12.5 mg total) by mouth 2 (two) times daily with a meal. 03/04/13  Yes Jacques Navy, MD  cloNIDine (CATAPRES) 0.2 MG tablet Take 1 tablet (0.2 mg total) by mouth 2 (two) times daily. 03/14/13  Yes Jacques Navy, MD  finasteride (PROSCAR) 5 MG tablet Take 1 tablet (5 mg total) by mouth daily. 03/14/13  Yes Jacques Navy, MD  furosemide (LASIX) 40 MG tablet Take 1 tablet (40 mg total) by mouth daily. 03/04/13  Yes Jacques Navy, MD  hydrALAZINE (APRESOLINE) 25  MG tablet Take 1 tablet (25 mg total) by mouth 3 (three) times daily. 03/04/13  Yes Jacques Navy, MD  insulin NPH Human (NOVOLIN N) 100 UNIT/ML injection Inject 0.3 mLs (30 Units total) into the skin daily. Per Vial with syringe/needle 08/07/13  Yes Corwin Levins, MD  levETIRAcetam (KEPPRA XR) 500 MG 24 hr tablet Take 1,000 mg by mouth at bedtime. Takes 2 tabs   Yes Historical Provider, MD  potassium chloride SA (K-DUR,KLOR-CON) 20 MEQ tablet Take 1 tablet (20 mEq total) by mouth 2 (two) times daily. 03/04/13  Yes Jacques Navy, MD  sertraline (ZOLOFT) 50 MG tablet Take 1 tablet (50 mg total) by mouth daily. 03/04/13  Yes Jacques Navy, MD  simvastatin (ZOCOR) 40 MG tablet Take 1 tablet (40 mg total) by mouth at bedtime. 03/04/13  Yes Jacques Navy, MD   tamsulosin (FLOMAX) 0.4 MG CAPS capsule Take 1 capsule (0.4 mg total) by mouth daily. 03/04/13  Yes Jacques Navy, MD  valsartan (DIOVAN) 320 MG tablet Take 1 tablet (320 mg total) by mouth daily. 03/04/13  Yes Jacques Navy, MD   BP 148/54  Pulse 61  Temp(Src) 97.6 F (36.4 C) (Oral)  Resp 16  SpO2 100% Physical Exam  Nursing note and vitals reviewed. Constitutional: He appears well-developed and well-nourished.  Mild global weakness, requiring help to sit up for examination  HENT:  Head: Normocephalic and atraumatic.  Right Ear: External ear normal.  Left Ear: External ear normal.  Eyes: Conjunctivae and EOM are normal. Pupils are equal, round, and reactive to light.  Neck: Normal range of motion and phonation normal. Neck supple.  Cardiovascular: Normal rate, regular rhythm, normal heart sounds and intact distal pulses.   Pulmonary/Chest: Effort normal and breath sounds normal. No respiratory distress. He has no wheezes. He exhibits no bony tenderness.  Abdominal: Soft. There is no tenderness.  Genitourinary:  Anus- normal sphincter tone, soft, brown stool in rectal vault. No rectal mass.  Musculoskeletal: Normal range of motion.  Neurological: He is alert. No sensory deficit. He exhibits normal muscle tone. Coordination normal.  He cooperates with simple  Commands. He does not speak when I asked him to answer questions. There is no evident focal asymmetry  Skin: Skin is warm, dry and intact.  Psychiatric:  He appears depressed    ED Course  Procedures (including critical care time) Medications  0.9 %  sodium chloride infusion ( Intravenous New Bag/Given 09/05/13 2132)  sodium chloride 0.9 % bolus 1,000 mL (0 mLs Intravenous Stopped 09/05/13 2131)    Patient Vitals for the past 24 hrs:  BP Temp Temp src Pulse Resp SpO2  09/06/13 0015 148/54 mmHg - - 61 16 100 %  09/06/13 0000 155/52 mmHg - - 59 16 100 %  09/05/13 2330 153/49 mmHg - - 59 17 99 %  09/05/13 2312 135/58  mmHg - - 63 14 100 %  09/05/13 2300 135/58 mmHg - - 66 20 100 %  09/05/13 2245 143/60 mmHg - - 56 14 100 %  09/05/13 2215 143/51 mmHg - - 62 15 100 %  09/05/13 2200 144/50 mmHg - - 61 16 100 %  09/05/13 2145 132/53 mmHg - - 63 16 100 %  09/05/13 2130 147/49 mmHg - - 57 17 100 %  09/05/13 2100 121/48 mmHg - - 65 13 99 %  09/05/13 2015 143/48 mmHg - - 62 16 100 %  09/05/13 2000 - - - - - 100 %  09/05/13 1955 - 97.6 F (36.4 C) Oral - - -  09/05/13 1930 135/76 mmHg - - 65 14 99 %  09/05/13 1915 127/45 mmHg - - 61 14 99 %  09/05/13 1845 128/51 mmHg - - 61 16 99 %  09/05/13 1838 129/46 mmHg - - - - 100 %  09/05/13 1731 80/48 mmHg 98.1 F (36.7 C) Oral 64 - 97 %    And out catheter was done to collect a urine sample and there is an incidental finding of 400 cc of urine in the urinary bladder.  12:29 AM Reevaluation with update and discussion. After initial assessment and treatment, an updated evaluation reveals  a bladder scan reveals 158 cc. the patient was able to drink 16 ounces of water, and has had about 1700 cc of IV saline. Findings discussed with wife, his caregiver. We discussed the pluses and minuses of Foley catheter placement. The wife elected to forego the catheter placement at this time, and will follow up with his PCP in 3 days for reevaluation. Shawnell Dykes L    Labs Review Labs Reviewed  CBC WITH DIFFERENTIAL - Abnormal; Notable for the following:    RBC 2.55 (*)    Hemoglobin 8.0 (*)    HCT 25.1 (*)    All other components within normal limits  BASIC METABOLIC PANEL - Abnormal; Notable for the following:    Glucose, Bld 149 (*)    BUN 27 (*)    GFR calc non Af Amer 58 (*)    GFR calc Af Amer 67 (*)    All other components within normal limits  CBG MONITORING, ED - Abnormal; Notable for the following:    Glucose-Capillary 153 (*)    All other components within normal limits  URINE CULTURE  URINALYSIS, ROUTINE W REFLEX MICROSCOPIC  TROPONIN I  POC OCCULT BLOOD,  ED    Imaging Review Dg Chest Port 1 View  09/05/2013   CLINICAL DATA:  Weakness  EXAM: PORTABLE CHEST - 1 VIEW  COMPARISON:  06/28/2013  FINDINGS: The heart size remains mildly enlarged without evidence for edema. Both lungs are clear. The visualized skeletal structures are unremarkable. Left AICD in place. Stable prominence of the superior vascular pedicle.  IMPRESSION: Cardiomegaly reidentified without focal acute finding.   Electronically Signed   By: Christiana Pellant M.D.   On: 09/05/2013 19:40     EKG Interpretation   Date/Time:  Friday September 05 2013 18:25:39 EDT Ventricular Rate:  63 PR Interval:  239 QRS Duration: 162 QT Interval:  465 QTC Calculation: 476 R Axis:   -75 Text Interpretation:  Sinus rhythm Prolonged PR interval RBBB and LAFB  since last tracing no significant change Confirmed by Effie Shy  MD, Sehar Sedano  ([id number]) on 09/05/2013 9:09:47 PM      MDM   Final diagnoses:  Malaise    Nonspecific malaise, with decreased oral intake. No evidence of significant bacterial infection, metabolic instability, or suspected impending vascular collapse. He had increased urine in the bladder, prior to catheterization, but does not have frank urinary retention, or renal insufficiency. There is incidental anemia without gastrointestinal bleeding.  Nursing Notes Reviewed/ Care Coordinated Applicable Imaging Reviewed Interpretation of Laboratory Data incorporated into ED treatment  The patient appears reasonably screened and/or stabilized for discharge and I doubt any other medical condition or other [facility] requiring further screening, evaluation, or treatment in the ED at this time prior to discharge.  Plan: Home Medications- usual; Home Treatments- encourage oral intake; return here if  the recommended treatment, does not improve the symptoms; Recommended follow up- PCP in 3 days     Flint MelterElliott L Lua Feng, MD 09/06/13 [phone]

## 2013-09-06 NOTE — ED Notes (Signed)
Will prepare patient for discharge per discussion with Dr. Effie ShyWentz.

## 2013-09-06 NOTE — Discharge Instructions (Signed)
Offer food, and fluids, regularly. Encouraged him to eat and drink as much is possible . Call his doctor for an appointment to be seen in 3 days for a checkup. Ask them, to check his bladder volume when they see him. Return here, if needed, for problems.   Fatigue Fatigue is a feeling of tiredness, lack of energy, lack of motivation, or feeling tired all the time. Having enough rest, good nutrition, and reducing stress will normally reduce fatigue. Consult your caregiver if it persists. The nature of your fatigue will help your caregiver to find out its cause. The treatment is based on the cause.  CAUSES  There are many causes for fatigue. Most of the time, fatigue can be traced to one or more of your habits or routines. Most causes fit into one or more of three general areas. They are: Lifestyle problems  Sleep disturbances.  Overwork.  Physical exertion.  Unhealthy habits.  Poor eating habits or eating disorders.  Alcohol and/or drug use .  Lack of proper nutrition (malnutrition). Psychological problems  Stress and/or anxiety problems.  Depression.  Grief.  Boredom. Medical Problems or Conditions  Anemia.  Pregnancy.  Thyroid gland problems.  Recovery from major surgery.  Continuous pain.  Emphysema or asthma that is not well controlled  Allergic conditions.  Diabetes.  Infections (such as mononucleosis).  Obesity.  Sleep disorders, such as sleep apnea.  Heart failure or other heart-related problems.  Cancer.  Kidney disease.  Liver disease.  Effects of certain medicines such as antihistamines, cough and cold remedies, prescription pain medicines, heart and blood pressure medicines, drugs used for treatment of cancer, and some antidepressants. SYMPTOMS  The symptoms of fatigue include:   Lack of energy.  Lack of drive (motivation).  Drowsiness.  Feeling of indifference to the surroundings. DIAGNOSIS  The details of how you feel help  guide your caregiver in finding out what is causing the fatigue. You will be asked about your present and past health condition. It is important to review all medicines that you take, including prescription and non-prescription items. A thorough exam will be done. You will be questioned about your feelings, habits, and normal lifestyle. Your caregiver may suggest blood tests, urine tests, or other tests to look for common medical causes of fatigue.  TREATMENT  Fatigue is treated by correcting the underlying cause. For example, if you have continuous pain or depression, treating these causes will improve how you feel. Similarly, adjusting the dose of certain medicines will help in reducing fatigue.  HOME CARE INSTRUCTIONS   Try to get the required amount of good sleep every night.  Eat a healthy and nutritious diet, and drink enough water throughout the day.  Practice ways of relaxing (including yoga or meditation).  Exercise regularly.  Make plans to change situations that cause stress. Act on those plans so that stresses decrease over time. Keep your work and personal routine reasonable.  Avoid street drugs and minimize use of alcohol.  Start taking a daily multivitamin after consulting your caregiver. SEEK MEDICAL CARE IF:   You have persistent tiredness, which cannot be accounted for.  You have fever.  You have unintentional weight loss.  You have headaches.  You have disturbed sleep throughout the night.  You are feeling sad.  You have constipation.  You have dry skin.  You have gained weight.  You are taking any new or different medicines that you suspect are causing fatigue.  You are unable to sleep at night.  You develop any unusual swelling of your legs or other parts of your body. SEEK IMMEDIATE MEDICAL CARE IF:   You are feeling confused.  Your vision is blurred.  You feel faint or pass out.  You develop severe headache.  You develop severe abdominal,  pelvic, or back pain.  You develop chest pain, shortness of breath, or an irregular or fast heartbeat.  You are unable to pass a normal amount of urine.  You develop abnormal bleeding such as bleeding from the rectum or you vomit blood.  You have thoughts about harming yourself or committing suicide.  You are worried that you might harm someone else. MAKE SURE YOU:   Understand these instructions.  Will watch your condition.  Will get help right away if you are not doing well or get worse. Document Released: 11/20/2006 Document Revised: 04/17/2011 Document Reviewed: 05/27/2013 [facility] Patient Information 2015 [employer] [place]. This information is not intended to replace advice given to you by your health care provider. Make sure you discuss any questions you have with your health care provider.

## 2013-09-06 NOTE — ED Notes (Signed)
Reported to Dr. Effie ShyWentz that the patient has 158mL on bladder scan post voiding.  He is in to see patient and family now.

## 2013-09-07 LAB — URINE CULTURE
Colony Count: NO GROWTH
Culture: NO GROWTH

## 2013-09-18 ENCOUNTER — Ambulatory Visit (INDEPENDENT_AMBULATORY_CARE_PROVIDER_SITE_OTHER): Payer: Commercial Managed Care - HMO | Admitting: Internal Medicine

## 2013-09-18 ENCOUNTER — Encounter: Payer: Self-pay | Admitting: Internal Medicine

## 2013-09-18 ENCOUNTER — Other Ambulatory Visit (INDEPENDENT_AMBULATORY_CARE_PROVIDER_SITE_OTHER): Payer: Commercial Managed Care - HMO

## 2013-09-18 ENCOUNTER — Telehealth: Payer: Self-pay

## 2013-09-18 VITALS — BP 122/80 | HR 64 | Temp 97.8°F

## 2013-09-18 DIAGNOSIS — D649 Anemia, unspecified: Secondary | ICD-10-CM | POA: Insufficient documentation

## 2013-09-18 DIAGNOSIS — I5022 Chronic systolic (congestive) heart failure: Secondary | ICD-10-CM

## 2013-09-18 DIAGNOSIS — E1065 Type 1 diabetes mellitus with hyperglycemia: Secondary | ICD-10-CM

## 2013-09-18 DIAGNOSIS — I10 Essential (primary) hypertension: Secondary | ICD-10-CM

## 2013-09-18 DIAGNOSIS — Z0000 Encounter for general adult medical examination without abnormal findings: Secondary | ICD-10-CM

## 2013-09-18 DIAGNOSIS — N320 Bladder-neck obstruction: Secondary | ICD-10-CM

## 2013-09-18 DIAGNOSIS — R634 Abnormal weight loss: Secondary | ICD-10-CM

## 2013-09-18 DIAGNOSIS — R5381 Other malaise: Secondary | ICD-10-CM

## 2013-09-18 DIAGNOSIS — IMO0002 Reserved for concepts with insufficient information to code with codable children: Secondary | ICD-10-CM

## 2013-09-18 DIAGNOSIS — D6489 Other specified anemias: Secondary | ICD-10-CM

## 2013-09-18 DIAGNOSIS — R1319 Other dysphagia: Secondary | ICD-10-CM

## 2013-09-18 DIAGNOSIS — Z8673 Personal history of transient ischemic attack (TIA), and cerebral infarction without residual deficits: Secondary | ICD-10-CM

## 2013-09-18 DIAGNOSIS — R5383 Other fatigue: Secondary | ICD-10-CM

## 2013-09-18 DIAGNOSIS — I509 Heart failure, unspecified: Secondary | ICD-10-CM

## 2013-09-18 DIAGNOSIS — G40909 Epilepsy, unspecified, not intractable, without status epilepticus: Secondary | ICD-10-CM

## 2013-09-18 DIAGNOSIS — E785 Hyperlipidemia, unspecified: Secondary | ICD-10-CM

## 2013-09-18 LAB — CBC WITH DIFFERENTIAL/PLATELET
BASOS PCT: 0.6 % (ref 0.0–3.0)
Basophils Absolute: 0 10*3/uL (ref 0.0–0.1)
EOS ABS: 0.1 10*3/uL (ref 0.0–0.7)
EOS PCT: 1.2 % (ref 0.0–5.0)
HCT: 28.5 % — ABNORMAL LOW (ref 39.0–52.0)
HEMOGLOBIN: 9.6 g/dL — AB (ref 13.0–17.0)
LYMPHS PCT: 21.1 % (ref 12.0–46.0)
Lymphs Abs: 1.7 10*3/uL (ref 0.7–4.0)
MCHC: 33.5 g/dL (ref 30.0–36.0)
MCV: 95.1 fl (ref 78.0–100.0)
Monocytes Absolute: 0.5 10*3/uL (ref 0.1–1.0)
Monocytes Relative: 6 % (ref 3.0–12.0)
Neutro Abs: 5.6 10*3/uL (ref 1.4–7.7)
Neutrophils Relative %: 71.1 % (ref 43.0–77.0)
Platelets: 242 10*3/uL (ref 150.0–400.0)
RBC: 3 Mil/uL — AB (ref 4.22–5.81)
RDW: 14.4 % (ref 11.5–15.5)
WBC: 7.8 10*3/uL (ref 4.0–10.5)

## 2013-09-18 LAB — IBC PANEL
IRON: 126 ug/dL (ref 42–165)
SATURATION RATIOS: 42.1 % (ref 20.0–50.0)
TRANSFERRIN: 213.8 mg/dL (ref 212.0–360.0)

## 2013-09-18 LAB — BASIC METABOLIC PANEL
BUN: 24 mg/dL — AB (ref 6–23)
BUN: 24 mg/dL — ABNORMAL HIGH (ref 6–23)
CALCIUM: 8.6 mg/dL (ref 8.4–10.5)
CO2: 27 meq/L (ref 19–32)
CO2: 29 meq/L (ref 19–32)
CREATININE: 1.2 mg/dL (ref 0.4–1.5)
Calcium: 8.4 mg/dL (ref 8.4–10.5)
Chloride: 103 mEq/L (ref 96–112)
Chloride: 101 mEq/L (ref 96–112)
Creatinine, Ser: 1.2 mg/dL (ref 0.4–1.5)
GFR: 75.47 mL/min (ref >60.00)
GFR: 73.37 mL/min (ref >60.00)
Glucose, Bld: 226 mg/dL — ABNORMAL HIGH (ref 70–99)
Glucose, Bld: 221 mg/dL — ABNORMAL HIGH (ref 70–99)
POTASSIUM: 3.8 meq/L (ref 3.5–5.1)
Potassium: 3.9 mEq/L (ref 3.5–5.1)
Sodium: 138 mEq/L (ref 135–145)
Sodium: 137 mEq/L (ref 135–145)

## 2013-09-18 LAB — HEPATIC FUNCTION PANEL
ALT: 13 U/L (ref 0–53)
ALT: 13 U/L (ref 0–53)
AST: 18 U/L (ref 0–37)
AST: 15 U/L (ref 0–37)
Albumin: 3.1 g/dL — ABNORMAL LOW (ref 3.5–5.2)
Albumin: 3.1 g/dL — ABNORMAL LOW (ref 3.5–5.2)
Alkaline Phosphatase: 60 U/L (ref 39–117)
Alkaline Phosphatase: 60 U/L (ref 39–117)
BILIRUBIN DIRECT: 0.1 mg/dL (ref 0.0–0.3)
BILIRUBIN TOTAL: 0.4 mg/dL (ref 0.2–1.2)
Bilirubin, Direct: 0.1 mg/dL (ref 0.0–0.3)
TOTAL PROTEIN: 6.5 g/dL (ref 6.0–8.3)
TOTAL PROTEIN: 6.6 g/dL (ref 6.0–8.3)
Total Bilirubin: 0.4 mg/dL (ref 0.2–1.2)

## 2013-09-18 LAB — LIPID PANEL
CHOL/HDL RATIO: 5
Cholesterol: 127 mg/dL (ref 0–200)
HDL: 27.6 mg/dL — ABNORMAL LOW (ref >39.00)
LDL Cholesterol: 63 mg/dL (ref 0–99)
NonHDL: 99.4
Triglycerides: 184 mg/dL — ABNORMAL HIGH (ref 0.0–149.0)
VLDL: 36.8 mg/dL (ref 0.0–40.0)

## 2013-09-18 LAB — TSH
TSH: 2.34 u[IU]/mL (ref 0.35–4.50)
TSH: 2.51 u[IU]/mL (ref 0.35–4.50)

## 2013-09-18 LAB — FERRITIN: Ferritin: 292.8 ng/mL (ref 22.0–322.0)

## 2013-09-18 LAB — VITAMIN B12: Vitamin B-12: 933 pg/mL — ABNORMAL HIGH (ref 211–911)

## 2013-09-18 LAB — HEMOGLOBIN A1C: Hgb A1c MFr Bld: 7.1 % — ABNORMAL HIGH (ref 4.6–6.5)

## 2013-09-18 LAB — PSA: PSA: 1.99 ng/mL (ref 0.10–4.00)

## 2013-09-18 MED ORDER — MIRTAZAPINE 30 MG PO TABS: 30.0000 mg | ORAL_TABLET | Freq: Every day | ORAL | Status: DC

## 2013-09-18 NOTE — Progress Notes (Signed)
Subjective:    Patient ID: [name], male    DOB: 11/12/1940, 73 y.o.   MRN: [id number]  HPI  Here to f/u after recent hospn with siezure with family,   Pt denies chest pain, increased sob or doe, wheezing, orthopnea, PND, increased LE swelling, palpitations, dizziness or syncope.  Pt denies polydipsia, polyuria, or low sugar symptoms such as weakness or confusion improved with po intake.  Pt denies new neurological symptoms such as new headache, or facial or extremity weakness or numbness.  Family states pt with marked difficulty getting to sleep in the evening, tends to sleep more during the day, appetite not doing well, may have lost some wt recently, not taking well po, seems to have some at least mild dysphagia to upper mid chest area without n/v, seems overall generally weaker, family hoping he does not fall. Seems more depressed.  Still using home o2 2L [place] at night. No overt bleeding, bruising.  No overt tonic/clonic sz since d/c Past Medical History  Diagnosis Date  . 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 Automatic implantable cardiac defibrillator in situ   . Congestive heart failure, unspecified   . CVA (cerebral vascular accident)   . Retention of urine, unspecified   . Pneumonia, organism unspecified   . Cough   . Acute bronchitis   . Contusion of foot   . Other sign and symptom in breast   . Fibromyalgia   . Hyperlipidemia   . Coronary atherosclerosis of unspecified type of vessel, native or graft   . Esophageal reflux   . Type I (juvenile type) diabetes mellitus without mention of complication, uncontrolled   . Essential hypertension, benign   . Cocaine abuse   . Alcohol abuse   . Microalbuminuria   . GERD (gastroesophageal reflux disease)   . Arteriovenous malformation (AVM)     of colon  . Diverticulosis   . GI bleed     secondary to avm   Past Surgical History  Procedure Laterality Date  . Back surgery      X 2  . Tonsillectomy    .  Cardiac defibrillator placement      reports that he has been smoking Cigarettes.  He has a 110 pack-year smoking history. He has never used smokeless tobacco. He reports that he does not drink alcohol or use illicit drugs. family history includes Colon cancer in his brother; Diabetes in his sister; Heart failure in his mother. There is no history of Prostate cancer. Allergies  Allergen Reactions  . Metformin Diarrhea   No current facility-administered medications on file prior to visit.   Current Outpatient Prescriptions on File Prior to Visit  Medication Sig Dispense Refill  . carvedilol (COREG) 25 MG tablet Take 0.5 tablets (12.5 mg total) by mouth 2 (two) times daily with a meal.  90 tablet  3  . cloNIDine (CATAPRES) 0.2 MG tablet Take 1 tablet (0.2 mg total) by mouth 2 (two) times daily.  28 tablet  0  . finasteride (PROSCAR) 5 MG tablet Take 1 tablet (5 mg total) by mouth daily.  14 tablet  0  . furosemide (LASIX) 40 MG tablet Take 1 tablet (40 mg total) by mouth daily.  90 tablet  3  . hydrALAZINE (APRESOLINE) 25 MG tablet Take 1 tablet (25 mg total) by mouth 3 (three) times daily.  270 tablet  3  . insulin NPH Human (NOVOLIN N) 100 UNIT/ML injection Inject 0.3 mLs (30  Units total) into the skin daily. Per Vial with syringe/needle  10 mL  11  . levETIRAcetam (KEPPRA XR) 500 MG 24 hr tablet Take 1,000 mg by mouth at bedtime. Takes 2 tabs      . potassium chloride SA (K-DUR,KLOR-CON) 20 MEQ tablet Take 1 tablet (20 mEq total) by mouth 2 (two) times daily.  180 tablet  3  . simvastatin (ZOCOR) 40 MG tablet Take 1 tablet (40 mg total) by mouth at bedtime.  90 tablet  3  . tamsulosin (FLOMAX) 0.4 MG CAPS capsule Take 1 capsule (0.4 mg total) by mouth daily.  90 capsule  3  . valsartan (DIOVAN) 320 MG tablet Take 1 tablet (320 mg total) by mouth daily.  90 tablet  3   Review of Systems  Constitutional: Negative for unusual diaphoresis or other sweats  HENT: Negative for ringing in  ear Eyes: Negative for double vision or worsening visual disturbance.  Respiratory: Negative for choking and stridor.   Gastrointestinal: Negative for vomiting or other signifcant bowel change Genitourinary: Negative for hematuria or decreased urine volume.  Musculoskeletal: Negative for other MSK pain or swelling Skin: Negative for color change and worsening wound.  Neurological: Negative for tremors and numbness other than noted  Psychiatric/Behavioral: Negative for decreased concentration or agitation other than above       Objective:   Physical Exam BP 122/80  Pulse 64  Temp(Src) 97.8 F (36.6 C) (Oral)  SpO2 93% VS noted,  Constitutional: Pt appears fatigued but alert and responds appropriately  HENT: Head: NCAT.  Right Ear: External ear normal.  Left Ear: External ear normal.  Eyes: . Pupils are equal, round, and reactive to light. Conjunctivae and EOM are normal Neck: Normal range of motion. Neck supple.  Cardiovascular: Normal rate and regular rhythm.   Pulmonary/Chest: Effort normal and breath sounds normal.  Abd:  Soft, NT, ND, + BS Neurological: Pt is alert.  motor grossly intact, cn 2-12 intact Skin: Skin is warm. No rash Psychiatric: Pt behavior is normal. No agitation.  + dysphoric    Assessment & Plan:

## 2013-09-18 NOTE — Patient Instructions (Addendum)
OK to stop the zoloft (sertraline)  Please take all new medication as prescribed - the remeron 30 mg at bedtime  You will be contacted regarding the referral for: Speech Pathology, Gastroenterology, [facility], and [facility] (and Physical Therapy)  Please continue all other medications as before, and refills have been done if requested.  Please have the pharmacy call with any other refills you may need.  Please keep your appointments with your specialists as you may have planned - neurology  Please go to the LAB in the Basement (turn left off the elevator) for the tests to be done today  You will be contacted by phone if any changes need to be made immediately.  Otherwise, you will receive a letter about your results with an explanation, but please check with MyChart first.  Please remember to sign up for MyChart if you have not done so, as this will be important to you in the future with finding out test results, communicating by private email, and scheduling acute appointments online when needed.  Please return in 1 months, or sooner if needed

## 2013-09-18 NOTE — Telephone Encounter (Signed)
Labs entered.

## 2013-09-18 NOTE — Progress Notes (Signed)
Pre visit review using our clinic review tool, if applicable. No additional management support is needed unless otherwise documented below in the visit note. 

## 2013-09-19 ENCOUNTER — Encounter (HOSPITAL_COMMUNITY): Payer: Self-pay | Admitting: Emergency Medicine

## 2013-09-19 ENCOUNTER — Inpatient Hospital Stay (HOSPITAL_COMMUNITY)
Admission: EM | Admit: 2013-09-19 | Discharge: 2013-09-22 | DRG: 101 | Disposition: A | Discharge status: Home / Self Care | Payer: Medicare HMO | Attending: Internal Medicine | Admitting: Internal Medicine

## 2013-09-19 ENCOUNTER — Emergency Department (HOSPITAL_COMMUNITY): Payer: Medicare HMO

## 2013-09-19 DIAGNOSIS — K219 Gastro-esophageal reflux disease without esophagitis: Secondary | ICD-10-CM | POA: Diagnosis present

## 2013-09-19 DIAGNOSIS — G40109 Localization-related (focal) (partial) symptomatic epilepsy and epileptic syndromes with simple partial seizures, not intractable, without status epilepticus: Secondary | ICD-10-CM | POA: Diagnosis not present

## 2013-09-19 DIAGNOSIS — R5381 Other malaise: Secondary | ICD-10-CM

## 2013-09-19 DIAGNOSIS — I959 Hypotension, unspecified: Secondary | ICD-10-CM | POA: Diagnosis present

## 2013-09-19 DIAGNOSIS — F1010 Alcohol abuse, uncomplicated: Secondary | ICD-10-CM | POA: Diagnosis present

## 2013-09-19 DIAGNOSIS — I441 Atrioventricular block, second degree: Secondary | ICD-10-CM | POA: Diagnosis present

## 2013-09-19 DIAGNOSIS — E785 Hyperlipidemia, unspecified: Secondary | ICD-10-CM | POA: Diagnosis present

## 2013-09-19 DIAGNOSIS — R809 Proteinuria, unspecified: Secondary | ICD-10-CM

## 2013-09-19 DIAGNOSIS — R338 Other retention of urine: Secondary | ICD-10-CM

## 2013-09-19 DIAGNOSIS — F17200 Nicotine dependence, unspecified, uncomplicated: Secondary | ICD-10-CM | POA: Diagnosis present

## 2013-09-19 DIAGNOSIS — I2510 Atherosclerotic heart disease of native coronary artery without angina pectoris: Secondary | ICD-10-CM | POA: Diagnosis present

## 2013-09-19 DIAGNOSIS — I69959 Hemiplegia and hemiparesis following unspecified cerebrovascular disease affecting unspecified side: Secondary | ICD-10-CM

## 2013-09-19 DIAGNOSIS — R059 Cough, unspecified: Secondary | ICD-10-CM

## 2013-09-19 DIAGNOSIS — N401 Enlarged prostate with lower urinary tract symptoms: Secondary | ICD-10-CM

## 2013-09-19 DIAGNOSIS — R5383 Other fatigue: Secondary | ICD-10-CM

## 2013-09-19 DIAGNOSIS — R109 Unspecified abdominal pain: Secondary | ICD-10-CM

## 2013-09-19 DIAGNOSIS — IMO0002 Reserved for concepts with insufficient information to code with codable children: Secondary | ICD-10-CM | POA: Diagnosis present

## 2013-09-19 DIAGNOSIS — Z993 Dependence on wheelchair: Secondary | ICD-10-CM

## 2013-09-19 DIAGNOSIS — Z800 Family history of malignant neoplasm of digestive organs: Secondary | ICD-10-CM

## 2013-09-19 DIAGNOSIS — IMO0001 Reserved for inherently not codable concepts without codable children: Secondary | ICD-10-CM

## 2013-09-19 DIAGNOSIS — N179 Acute kidney failure, unspecified: Secondary | ICD-10-CM | POA: Diagnosis present

## 2013-09-19 DIAGNOSIS — Z8249 Family history of ischemic heart disease and other diseases of the circulatory system: Secondary | ICD-10-CM | POA: Diagnosis not present

## 2013-09-19 DIAGNOSIS — R1319 Other dysphagia: Secondary | ICD-10-CM

## 2013-09-19 DIAGNOSIS — I509 Heart failure, unspecified: Secondary | ICD-10-CM | POA: Diagnosis present

## 2013-09-19 DIAGNOSIS — T465X5A Adverse effect of other antihypertensive drugs, initial encounter: Secondary | ICD-10-CM | POA: Diagnosis present

## 2013-09-19 DIAGNOSIS — I952 Hypotension due to drugs: Secondary | ICD-10-CM

## 2013-09-19 DIAGNOSIS — I10 Essential (primary) hypertension: Secondary | ICD-10-CM

## 2013-09-19 DIAGNOSIS — Z833 Family history of diabetes mellitus: Secondary | ICD-10-CM

## 2013-09-19 DIAGNOSIS — F1410 Cocaine abuse, uncomplicated: Secondary | ICD-10-CM | POA: Diagnosis present

## 2013-09-19 DIAGNOSIS — R05 Cough: Secondary | ICD-10-CM

## 2013-09-19 DIAGNOSIS — Z95810 Presence of automatic (implantable) cardiac defibrillator: Secondary | ICD-10-CM | POA: Diagnosis present

## 2013-09-19 DIAGNOSIS — G40909 Epilepsy, unspecified, not intractable, without status epilepticus: Secondary | ICD-10-CM

## 2013-09-19 DIAGNOSIS — E1065 Type 1 diabetes mellitus with hyperglycemia: Secondary | ICD-10-CM | POA: Diagnosis present

## 2013-09-19 DIAGNOSIS — I5042 Chronic combined systolic (congestive) and diastolic (congestive) heart failure: Secondary | ICD-10-CM | POA: Diagnosis present

## 2013-09-19 DIAGNOSIS — R569 Unspecified convulsions: Secondary | ICD-10-CM

## 2013-09-19 DIAGNOSIS — R4182 Altered mental status, unspecified: Secondary | ICD-10-CM | POA: Diagnosis present

## 2013-09-19 DIAGNOSIS — I739 Peripheral vascular disease, unspecified: Secondary | ICD-10-CM

## 2013-09-19 DIAGNOSIS — N6459 Other signs and symptoms in breast: Secondary | ICD-10-CM

## 2013-09-19 LAB — URINALYSIS, ROUTINE W REFLEX MICROSCOPIC
Bilirubin Urine: NEGATIVE
Glucose, UA: NEGATIVE mg/dL
Hgb urine dipstick: NEGATIVE
Ketones, ur: NEGATIVE mg/dL
Leukocytes, UA: NEGATIVE
NITRITE: NEGATIVE
Protein, ur: NEGATIVE mg/dL
Specific Gravity, Urine: 1.013 (ref 1.005–1.030)
UROBILINOGEN UA: 0.2 mg/dL (ref 0.0–1.0)
pH: 5 (ref 5.0–8.0)

## 2013-09-19 LAB — CBC WITH DIFFERENTIAL/PLATELET
BASOS ABS: 0 10*3/uL (ref 0.0–0.1)
Basophils Relative: 0 % (ref 0–1)
EOS PCT: 1 % (ref 0–5)
Eosinophils Absolute: 0.1 10*3/uL (ref 0.0–0.7)
HEMATOCRIT: 27.2 % — AB (ref 39.0–52.0)
Hemoglobin: 8.7 g/dL — ABNORMAL LOW (ref 13.0–17.0)
LYMPHS PCT: 19 % (ref 12–46)
Lymphs Abs: 1.5 10*3/uL (ref 0.7–4.0)
MCH: 31.9 pg (ref 26.0–34.0)
MCHC: 32 g/dL (ref 30.0–36.0)
MCV: 99.6 fL (ref 78.0–100.0)
MONO ABS: 0.6 10*3/uL (ref 0.1–1.0)
Monocytes Relative: 8 % (ref 3–12)
Neutro Abs: 5.7 10*3/uL (ref 1.7–7.7)
Neutrophils Relative %: 72 % (ref 43–77)
Platelets: 215 10*3/uL (ref 150–400)
RBC: 2.73 MIL/uL — ABNORMAL LOW (ref 4.22–5.81)
RDW: 14.2 % (ref 11.5–15.5)
WBC: 8 10*3/uL (ref 4.0–10.5)

## 2013-09-19 LAB — COMPREHENSIVE METABOLIC PANEL
ALT: 11 U/L (ref 0–53)
ANION GAP: 10 (ref 5–15)
AST: 11 U/L (ref 0–37)
Albumin: 2.8 g/dL — ABNORMAL LOW (ref 3.5–5.2)
Alkaline Phosphatase: 64 U/L (ref 39–117)
BUN: 22 mg/dL (ref 6–23)
CO2: 27 meq/L (ref 19–32)
CREATININE: 1.4 mg/dL — AB (ref 0.50–1.35)
Calcium: 8.3 mg/dL — ABNORMAL LOW (ref 8.4–10.5)
Chloride: 106 mEq/L (ref 96–112)
GFR calc Af Amer: 56 mL/min — ABNORMAL LOW (ref >90)
GFR calc non Af Amer: 48 mL/min — ABNORMAL LOW (ref >90)
Glucose, Bld: 238 mg/dL — ABNORMAL HIGH (ref 70–99)
Potassium: 4.2 mEq/L (ref 3.7–5.3)
Sodium: 143 mEq/L (ref 137–147)
Total Bilirubin: 0.3 mg/dL (ref 0.3–1.2)
Total Protein: 5.7 g/dL — ABNORMAL LOW (ref 6.0–8.3)

## 2013-09-19 LAB — TSH: TSH: 1.8 u[IU]/mL (ref 0.350–4.500)

## 2013-09-19 LAB — GLUCOSE, CAPILLARY
GLUCOSE-CAPILLARY: 156 mg/dL — AB (ref 70–99)
GLUCOSE-CAPILLARY: 34 mg/dL — AB (ref 70–99)

## 2013-09-19 LAB — RAPID URINE DRUG SCREEN, HOSP PERFORMED
Amphetamines: NOT DETECTED
Barbiturates: NOT DETECTED
Benzodiazepines: NOT DETECTED
COCAINE: NOT DETECTED
Opiates: NOT DETECTED
Tetrahydrocannabinol: NOT DETECTED

## 2013-09-19 LAB — TROPONIN I: Troponin I: <0.3 ng/mL (ref <0.30)

## 2013-09-19 LAB — PRO B NATRIURETIC PEPTIDE: PRO B NATRI PEPTIDE: 920.2 pg/mL — AB (ref 0–125)

## 2013-09-19 MED ORDER — INSULIN ASPART 100 UNIT/ML ~~LOC~~ SOLN: 0.0000 [IU] | Freq: Every day | SUBCUTANEOUS | Status: DC

## 2013-09-19 MED ORDER — SIMVASTATIN 40 MG PO TABS
40.0000 mg | ORAL_TABLET | Freq: Every day | ORAL | Status: DC
  Administered 2013-09-19 – 2013-09-21 (×3): 40 mg via ORAL
  Filled 2013-09-19 (×4): qty 1

## 2013-09-19 MED ORDER — SODIUM CHLORIDE 0.9 % IV BOLUS (SEPSIS)
1000.0000 mL | Freq: Once | INTRAVENOUS | Status: AC
  Administered 2013-09-19: 1000 mL via INTRAVENOUS

## 2013-09-19 MED ORDER — FINASTERIDE 5 MG PO TABS
5.0000 mg | ORAL_TABLET | Freq: Every day | ORAL | Status: DC
  Administered 2013-09-20 – 2013-09-22 (×3): 5 mg via ORAL
  Filled 2013-09-19 (×3): qty 1

## 2013-09-19 MED ORDER — HEPARIN SODIUM (PORCINE) 5000 UNIT/ML IJ SOLN
5000.0000 [IU] | Freq: Three times a day (TID) | INTRAMUSCULAR | Status: DC
  Administered 2013-09-19 – 2013-09-22 (×8): 5000 [IU] via SUBCUTANEOUS
  Filled 2013-09-19 (×11): qty 1

## 2013-09-19 MED ORDER — INSULIN ASPART 100 UNIT/ML ~~LOC~~ SOLN: 0.0000 [IU] | Freq: Three times a day (TID) | SUBCUTANEOUS | Status: DC

## 2013-09-19 MED ORDER — CLONIDINE HCL 0.2 MG PO TABS
0.2000 mg | ORAL_TABLET | Freq: Two times a day (BID) | ORAL | Status: DC
  Administered 2013-09-20 – 2013-09-22 (×5): 0.2 mg via ORAL
  Filled 2013-09-19 (×6): qty 1

## 2013-09-19 MED ORDER — POTASSIUM CHLORIDE CRYS ER 20 MEQ PO TBCR
20.0000 meq | EXTENDED_RELEASE_TABLET | Freq: Two times a day (BID) | ORAL | Status: DC
Start: 2013-09-19 — End: 2013-09-22
  Administered 2013-09-19 – 2013-09-22 (×6): 20 meq via ORAL
  Filled 2013-09-19 (×7): qty 1

## 2013-09-19 MED ORDER — DEXTROSE 50 % IV SOLN
INTRAVENOUS | Status: AC
  Administered 2013-09-19: 50 mL
  Filled 2013-09-19: qty 50

## 2013-09-19 MED ORDER — ONDANSETRON HCL 4 MG PO TABS: 4.0000 mg | ORAL_TABLET | Freq: Four times a day (QID) | ORAL | Status: DC | PRN

## 2013-09-19 MED ORDER — ACETAMINOPHEN 650 MG RE SUPP: 650.0000 mg | Freq: Four times a day (QID) | RECTAL | Status: DC | PRN

## 2013-09-19 MED ORDER — TAMSULOSIN HCL 0.4 MG PO CAPS
0.4000 mg | ORAL_CAPSULE | Freq: Every day | ORAL | Status: DC
  Administered 2013-09-20 – 2013-09-22 (×3): 0.4 mg via ORAL
  Filled 2013-09-19 (×3): qty 1

## 2013-09-19 MED ORDER — SODIUM CHLORIDE 0.9 % IJ SOLN
3.0000 mL | Freq: Two times a day (BID) | INTRAMUSCULAR | Status: DC
  Administered 2013-09-19 – 2013-09-22 (×6): 3 mL via INTRAVENOUS

## 2013-09-19 MED ORDER — CARVEDILOL 12.5 MG PO TABS
12.5000 mg | ORAL_TABLET | Freq: Two times a day (BID) | ORAL | Status: DC
  Administered 2013-09-20 – 2013-09-22 (×5): 12.5 mg via ORAL
  Filled 2013-09-19 (×7): qty 1

## 2013-09-19 MED ORDER — INSULIN NPH (HUMAN) (ISOPHANE) 100 UNIT/ML ~~LOC~~ SUSP
30.0000 [IU] | Freq: Every day | SUBCUTANEOUS | Status: DC
  Filled 2013-09-19: qty 10

## 2013-09-19 MED ORDER — ONDANSETRON HCL 4 MG/2ML IJ SOLN: 4.0000 mg | Freq: Four times a day (QID) | INTRAMUSCULAR | Status: DC | PRN

## 2013-09-19 MED ORDER — ACETAMINOPHEN 325 MG PO TABS: 650.0000 mg | ORAL_TABLET | Freq: Four times a day (QID) | ORAL | Status: DC | PRN

## 2013-09-19 NOTE — ED Notes (Signed)
Neurology [name] at bedside.

## 2013-09-19 NOTE — ED Provider Notes (Signed)
CSN: [id number]     Arrival date & time 09/19/13  1646 History   First MD Initiated Contact with Patient 09/19/13 1646     Chief Complaint  Patient presents with  . 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 EMS were called to the house by son who found patient to be slumping over and unresponsive, drooling on himself. This is happening more frequently. Unclear cause, but in the past have been told possibly seizure activity. Patient takes Keppra daily and has not missed a dose. No apparent head, or injury. Family cause these "sleeping spells". Last one occurred in May.   He denies CP, SOB, fever, chills, N/V, diarrhea, constipation, hematemesis, dysuria, hematuria, sick contacts, or recent travel. He denies any drug or alcohol use.  (Consider location/radiation/quality/duration/timing/severity/associated sxs/prior Treatment) Patient is a 73 y.o. male presenting with syncope.  Loss of Consciousness Episode history:  Single Most recent episode:  Today Duration:  1 hour Timing:  Intermittent Progression:  Worsening Chronicity:  Recurrent Relieved by:  Nothing Associated symptoms: no difficulty breathing, no recent fall, no recent injury, no recent surgery and no rectal bleeding     Past Medical History  Diagnosis Date  . 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 Automatic implantable cardiac defibrillator in situ   . Congestive heart failure, unspecified   . CVA (cerebral vascular accident)   . Retention of urine, unspecified   . Pneumonia, organism unspecified   . Cough   . Acute bronchitis   . Contusion of foot   . Other sign and symptom in breast   . Fibromyalgia   . Hyperlipidemia   . Coronary atherosclerosis of unspecified type of vessel, native or graft   . Esophageal reflux   . Type I (juvenile  type) diabetes mellitus without mention of complication, uncontrolled   . Essential hypertension, benign   . Cocaine abuse   . Alcohol abuse   . Microalbuminuria   . GERD (gastroesophageal reflux disease)   . Arteriovenous malformation (AVM)     of colon  . Diverticulosis   . GI bleed     secondary to avm   Past Surgical History  Procedure Laterality Date  . Back surgery      X 2  . Tonsillectomy    . Cardiac defibrillator placement     Family History  Problem Relation Age of Onset  . Heart failure Mother   . Diabetes Sister   . Colon cancer Brother     questionable  . Prostate cancer Neg Hx    History  Substance Use Topics  . Smoking status: Current Every Day Smoker -- 2.00 packs/day for 55 years    Types: Cigarettes  . Smokeless tobacco: Never Used     Comment: down to 6 cigarettes a day  . Alcohol Use: No     Comment: quit at the time of stroke    Review of Systems  Unable to perform ROS Cardiovascular: Positive for syncope.      Allergies  Metformin  Home Medications   Prior to Admission medications   Medication Sig Start Date End Date Taking? Authorizing Provider  carvedilol (COREG) 25 MG tablet Take 0.5 tablets (12.5 mg total) by mouth 2 (two) times daily with a meal. 03/04/13  Yes Jacques NavyMichael E Norins, MD  cloNIDine (CATAPRES) 0.2 MG tablet Take 1 tablet (0.2 mg total) by mouth 2 (two) times daily. 03/14/13  Yes Jacques Navy, MD  finasteride (PROSCAR) 5 MG tablet Take 1 tablet (5 mg total) by mouth daily. 03/14/13  Yes Jacques Navy, MD  furosemide (LASIX) 40 MG tablet Take 1 tablet (40 mg total) by mouth daily. 03/04/13  Yes Jacques Navy, MD  hydrALAZINE (APRESOLINE) 25 MG tablet Take 1 tablet (25 mg total) by mouth 3 (three) times daily. 03/04/13  Yes Jacques Navy, MD  insulin NPH Human (NOVOLIN N) 100 UNIT/ML injection Inject 0.3 mLs (30 Units total) into the skin daily. Per Vial with syringe/needle 08/07/13  Yes Corwin Levins, MD  levETIRAcetam  (KEPPRA XR) 500 MG 24 hr tablet Take 1,000 mg by mouth at bedtime. Takes 2 tabs   Yes Historical Provider, MD  mirtazapine (REMERON) 30 MG tablet Take 1 tablet (30 mg total) by mouth at bedtime. 09/18/13  Yes Corwin Levins, MD  potassium chloride SA (K-DUR,KLOR-CON) 20 MEQ tablet Take 1 tablet (20 mEq total) by mouth 2 (two) times daily. 03/04/13  Yes Jacques Navy, MD  simvastatin (ZOCOR) 40 MG tablet Take 1 tablet (40 mg total) by mouth at bedtime. 03/04/13  Yes Jacques Navy, MD  tamsulosin (FLOMAX) 0.4 MG CAPS capsule Take 1 capsule (0.4 mg total) by mouth daily. 03/04/13  Yes Jacques Navy, MD  valsartan (DIOVAN) 320 MG tablet Take 1 tablet (320 mg total) by mouth daily. 03/04/13  Yes Jacques Navy, MD   BP 111/44  Pulse 50  Temp(Src) 97.4 F (36.3 C) (Oral)  Resp 18  SpO2 100% Physical Exam  Nursing note and vitals reviewed. Constitutional: He appears well-developed and well-nourished. No distress.  HENT:  Head: Normocephalic and atraumatic.  Eyes: Pupils are equal, round, and reactive to light.  Neck: Normal range of motion.  Cardiovascular: Normal rate, regular rhythm, normal heart sounds and intact distal pulses.  Exam reveals no gallop and no friction rub.   No murmur heard. Pulmonary/Chest: Effort normal and breath sounds normal. No respiratory distress. He has no wheezes. He has no rales. He exhibits no tenderness.  Abdominal: Soft. Bowel sounds are normal. He exhibits no distension and no mass. There is no tenderness. There is no rebound and no guarding.  Musculoskeletal: Normal range of motion.  Lymphadenopathy:    He has no cervical adenopathy.  Neurological:  Awakens to stimulus and answers questions appropriately, but appears very tired/sleepy. Cannot tell me what happened today.   Skin: Skin is warm and dry. He is not diaphoretic.    ED Course  Procedures (including critical care time) Labs Review Labs Reviewed  CBC WITH DIFFERENTIAL - Abnormal; Notable for  the following:    RBC 2.73 (*)    Hemoglobin 8.7 (*)    HCT 27.2 (*)    All other components within normal limits  COMPREHENSIVE METABOLIC PANEL - Abnormal; Notable for the following:    Glucose, Bld 238 (*)    Creatinine, Ser 1.40 (*)    Calcium 8.3 (*)    Total Protein 5.7 (*)    Albumin 2.8 (*)    GFR calc non Af Amer 48 (*)    GFR calc Af Amer 56 (*)    All other components within normal limits  URINALYSIS, ROUTINE W REFLEX MICROSCOPIC - Abnormal; Notable for the following:    APPearance CLOUDY (*)    All other components within normal limits  PRO B NATRIURETIC PEPTIDE - Abnormal; Notable for the following:    Pro B Natriuretic peptide (BNP) 920.2 (*)    All other components within normal limits  GLUCOSE, CAPILLARY - Abnormal; Notable for the following:    Glucose-Capillary 34 (*)    All other components within normal limits  GLUCOSE, CAPILLARY - Abnormal; Notable for the following:    Glucose-Capillary 156 (*)    All other components within normal limits  URINE RAPID DRUG SCREEN (HOSP PERFORMED)  TSH  TROPONIN I  VITAMIN B12  FOLATE    Imaging Review Dg Chest Portable 1 View  09/19/2013   CLINICAL DATA:  Chest pain  EXAM: PORTABLE CHEST - 1 VIEW  COMPARISON:  09/05/2013  FINDINGS: Left-sided pacemaker single continuously overlies normal cardiac silhouette. No effusion, infiltrate, or pneumothorax. Normal pulmonary vasculature.  IMPRESSION: No acute cardiopulmonary process.   Electronically Signed   By: Genevive Bi M.D.   On: 09/19/2013 18:02     EKG Interpretation None      MDM   73 yo male w/syncope vs seizure? Please see HPI for details. On exam, pt somnolent but answers questions appropriately. AF, Vitals show HoTN w/systolics in the 80s. Started on IVFs immediately. Pt has intermittent bradycardia w/pacer spike from pacemaker. Rate between 40s-60s. Per EMS patient may have had slight response to Narcan. Here, patient somnolent appearing but still responsive  appropriate to questions. Oriented to person place and time. He has residual weakness in his right upper extremity and right lower extremity from prior stroke. Patient denies any new weakness or pain anywhere. No sign of head trauma. Differential includes breakthrough seizure w/post-ictal period vs syncope. Will obtain 0.8 glucose, CBC, CMP, UA, BMP, chest x-ray, EKG, UDS.  Chest x-ray within normal limits. CBC and CMP within normal limits aside from elevated glucose over 230. According to wife normal glucose is around 150. Did not miss his insulin injection today. EKG w/no sign of ischemia. Has occasional paced beats. BNP elevated at 920, but due to HoTN will continue fluids.   Pt on his 2nd L NS. BP rising. Pt stable. A&O x 3. Wife at bedside.   Pt will be admitted to hospitalist service w/Neurology consulted. Please see their notes for further details regarding the remainder of his hospital course.   Final diagnoses:  Hemiplegia affecting unspecified side, late effect of cerebrovascular disease  Seizure disorder  Seizures  Type I (juvenile type) diabetes mellitus without mention of complication, uncontrolled    Pt was seen under the supervision of Dr. Rubin Payor.     Rachelle Hora, MD 09/20/13 [phone]

## 2013-09-19 NOTE — ED Notes (Signed)
Single chamber ICD check in ER.  Normal device function.  No episodes.  Underlying rhythm appears to be irregular sinus with frequent paces at 40 bpm.  Device shows 5% Ventricular pacing since last office check on Nov 2014.  However, Ventricular pacing tonight while in ER room appears to be around 10% of the time.  I spoke to Dr. Allena KatzPatel about possibly increasing lower rate limit (with discussion about losing AV synchrony) but he wanted to see how patient did during the night.  Also discussed possible upgrade to dual chamber device.  Paper printout was put in chart that went with patient to his floor room.  Lead testing: 14.673mv, 671 ohms, 0.7@0 .4ms, 43 ohms  [employer]ey De[employer] [phone]

## 2013-09-19 NOTE — Progress Notes (Addendum)
Hypoglycemic Event  CBG: 34  Treatment: 25GM 50%Dextrose amp  Symptoms: Pale and Sweaty  Follow-up CBG: Time:2310 CBG Result:156  Possible Reasons for Event: Inadequate meal intake and Medication regimen: Insulin 40 unites today per wife  Comments/MD notified:MD on call notified and discussed monitoring.   Patient also experiencing SB in 30's, PPM not capturing all missed beats. BP 112/42, O2 RA 97%.   After Dextrose given HR up to 40's-50's and patient more alert and conversable. Oriented to person and place. Answering questions appropriately. Per previous discussion with wife wears O2 at noc so patient placed on 2L O2-MD aware. Held carvedilol. Per MD will continue to monitor and notify of any low CBG's or HR <40.   Bess Kinds M  Remember to initiate Hypoglycemia Order Set & complete

## 2013-09-19 NOTE — Consult Note (Signed)
Reason for Consult: Recurrent partial seizure.  HPI:                                                                                                                                          [name] is an 72 y.o. male with a history of previous left cerebral infarction, hyperlipidemia, diabetes mellitus, hypertension and alcohol abuse, brought to the emergency room following a recurrent seizure. Patient was noted to become somewhat less responsive with inattentiveness to surroundings and eyes closed as well as drooling. Patient has had recurrent spells of this type since November 2014. He's currently on Keppra 1000 mg extended release at bedtime. Since being on Keppra patient has been excessively somnolent during the day. There was no improvement and he was changed from 500 mg twice a day 2 1000 mg extended release at bedtime. Following arrival in the emergency room patient's mental status improved and he became responsive and and is essentially back to baseline. He has residual severe right hemiparesis and gets around in a motorized wheelchair. His last seizure was about 3 weeks ago.  Past Medical History  Diagnosis Date  . 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 Automatic implantable cardiac defibrillator in situ   . Congestive heart failure, unspecified   . CVA (cerebral vascular accident)   . Retention of urine, unspecified   . Pneumonia, organism unspecified   . Cough   . Acute bronchitis   . Contusion of foot   . Other sign and symptom in breast   . Fibromyalgia   . Hyperlipidemia   . Coronary atherosclerosis of unspecified type of vessel, native or graft   . Esophageal reflux   . Type I (juvenile type) diabetes mellitus without mention of complication, uncontrolled   . Essential hypertension, benign   . Cocaine abuse   . Alcohol abuse   . Microalbuminuria   . GERD (gastroesophageal reflux disease)   . Arteriovenous malformation (AVM)     of colon  .  Diverticulosis   . GI bleed     secondary to avm    Past Surgical History  Procedure Laterality Date  . Back surgery      X 2  . Tonsillectomy    . Cardiac defibrillator placement      Family History  Problem Relation Age of Onset  . Heart failure Mother   . Diabetes Sister   . Colon cancer Brother     questionable  . Prostate cancer Neg Hx     Social History:  reports that he has been smoking Cigarettes.  He has a 110 pack-year smoking history. He has never used smokeless tobacco. He reports that he does not drink alcohol or use illicit drugs.  Allergies  Allergen Reactions  . Metformin Diarrhea    MEDICATIONS:  I have reviewed the patient's current medications.   ROS:                                                                                                                                       History obtained from spouse  General ROS: Typically fatigued and sleeps most of the day Psychological ROS: negative for - behavioral disorder, hallucinations, memory difficulties, mood swings or suicidal ideation Ophthalmic ROS: negative for - blurry vision, double vision, eye pain or loss of vision ENT ROS: negative for - epistaxis, nasal discharge, oral lesions, sore throat, tinnitus or vertigo Allergy and Immunology ROS: negative for - hives or itchy/watery eyes Hematological and Lymphatic ROS: negative for - bleeding problems, bruising or swollen lymph nodes Endocrine ROS: negative for - galactorrhea, hair pattern changes, polydipsia/polyuria or temperature intolerance Respiratory ROS: negative for - cough, hemoptysis, shortness of breath or wheezing Cardiovascular ROS: negative for - chest pain, dyspnea on exertion, edema or irregular heartbeat Gastrointestinal ROS: negative for - abdominal pain, diarrhea, hematemesis, nausea/vomiting or stool  incontinence Genito-Urinary ROS: negative for - dysuria, hematuria, incontinence or urinary frequency/urgency Musculoskeletal ROS: negative for - joint swelling or muscular weakness Neurological ROS: as noted in HPI Dermatological ROS: negative for rash and skin lesion changes   Blood pressure 140/66, pulse 65, temperature 97.4 F (36.3 C), temperature source Oral, resp. rate 13, SpO2 100.00%.   Neurologic Examination:                                                                                                      Mental Status: Alert, oriented to person and place but not to correct month. He was in no acute distress.  Speech moderately slurred with mild word finding difficulty. Able to follow commands without difficulty. Cranial Nerves: II-Visual fields were normal. III/IV/VI-Pupils were equal and reacted. Extraocular movements were full and conjugate.    V/VII-no facial numbness; mild right lower facial weakness. VIII-normal. X-moderate dysarthria. Motor: Severe weakness with increased muscle tone involving right upper and lower extremities with inability to lift either extremity against gravity. Strength and muscle tone of left extremities were normal. Sensory: Reduced perception of tactile sensation over right extremities compared to left. Deep Tendon Reflexes: 2+ and symmetric. Plantars: Extensor on the right and flexor on the left Cerebellar: Normal finger-to-nose testing with use of left upper extremity.  Lab Results  Component Value Date/Time   CHOL 127 09/18/2013 12:18 PM    Results for orders placed during the hospital  encounter of 09/19/13 (from the past 48 hour(s))  CBC WITH DIFFERENTIAL     Status: Abnormal   Collection Time    09/19/13  5:07 PM      Result Value Ref Range   WBC 8.0  4.0 - 10.5 K/uL   RBC 2.73 (*) 4.22 - 5.81 MIL/uL   Hemoglobin 8.7 (*) 13.0 - 17.0 g/dL   HCT 27.2 (*) 39.0 - 52.0 %   MCV 99.6  78.0 - 100.0 fL   MCH 31.9  26.0 - 34.0 pg   MCHC  32.0  30.0 - 36.0 g/dL   RDW 14.2  11.5 - 15.5 %   Platelets 215  150 - 400 K/uL   Neutrophils Relative % 72  43 - 77 %   Neutro Abs 5.7  1.7 - 7.7 K/uL   Lymphocytes Relative 19  12 - 46 %   Lymphs Abs 1.5  0.7 - 4.0 K/uL   Monocytes Relative 8  3 - 12 %   Monocytes Absolute 0.6  0.1 - 1.0 K/uL   Eosinophils Relative 1  0 - 5 %   Eosinophils Absolute 0.1  0.0 - 0.7 K/uL   Basophils Relative 0  0 - 1 %   Basophils Absolute 0.0  0.0 - 0.1 K/uL  COMPREHENSIVE METABOLIC PANEL     Status: Abnormal   Collection Time    09/19/13  5:07 PM      Result Value Ref Range   Sodium 143  137 - 147 mEq/L   Potassium 4.2  3.7 - 5.3 mEq/L   Chloride 106  96 - 112 mEq/L   CO2 27  19 - 32 mEq/L   Glucose, Bld 238 (*) 70 - 99 mg/dL   BUN 22  6 - 23 mg/dL   Creatinine, Ser 1.40 (*) 0.50 - 1.35 mg/dL   Calcium 8.3 (*) 8.4 - 10.5 mg/dL   Total Protein 5.7 (*) 6.0 - 8.3 g/dL   Albumin 2.8 (*) 3.5 - 5.2 g/dL   AST 11  0 - 37 U/L   ALT 11  0 - 53 U/L   Alkaline Phosphatase 64  39 - 117 U/L   Total Bilirubin 0.3  0.3 - 1.2 mg/dL   GFR calc non Af Amer 48 (*) >90 mL/min   GFR calc Af Amer 56 (*) >90 mL/min   Comment: (NOTE)     The eGFR has been calculated using the CKD EPI equation.     This calculation has not been validated in all clinical situations.     eGFR's persistently <90 mL/min signify possible Chronic Kidney     Disease.   Anion gap 10  5 - 15  PRO B NATRIURETIC PEPTIDE     Status: Abnormal   Collection Time    09/19/13  5:07 PM      Result Value Ref Range   Pro B Natriuretic peptide (BNP) 920.2 (*) 0 - 125 pg/mL  URINE RAPID DRUG SCREEN (HOSP PERFORMED)     Status: None   Collection Time    09/19/13  5:55 PM      Result Value Ref Range   Opiates NONE DETECTED  NONE DETECTED   Cocaine NONE DETECTED  NONE DETECTED   Benzodiazepines NONE DETECTED  NONE DETECTED   Amphetamines NONE DETECTED  NONE DETECTED   Tetrahydrocannabinol NONE DETECTED  NONE DETECTED   Barbiturates NONE  DETECTED  NONE DETECTED   Comment:  DRUG SCREEN FOR MEDICAL PURPOSES     ONLY.  IF CONFIRMATION IS NEEDED     FOR ANY PURPOSE, NOTIFY LAB     WITHIN 5 DAYS.                LOWEST DETECTABLE LIMITS     FOR URINE DRUG SCREEN     Drug Class       Cutoff (ng/mL)     Amphetamine      1000     Barbiturate      200     Benzodiazepine   798     Tricyclics       921     Opiates          300     Cocaine          300     THC              50  URINALYSIS, ROUTINE W REFLEX MICROSCOPIC     Status: Abnormal   Collection Time    09/19/13  5:55 PM      Result Value Ref Range   Color, Urine YELLOW  YELLOW   APPearance CLOUDY (*) CLEAR   Specific Gravity, Urine 1.013  1.005 - 1.030   pH 5.0  5.0 - 8.0   Glucose, UA NEGATIVE  NEGATIVE mg/dL   Hgb urine dipstick NEGATIVE  NEGATIVE   Bilirubin Urine NEGATIVE  NEGATIVE   Ketones, ur NEGATIVE  NEGATIVE mg/dL   Protein, ur NEGATIVE  NEGATIVE mg/dL   Urobilinogen, UA 0.2  0.0 - 1.0 mg/dL   Nitrite NEGATIVE  NEGATIVE   Leukocytes, UA NEGATIVE  NEGATIVE   Comment: MICROSCOPIC NOT DONE ON URINES WITH NEGATIVE PROTEIN, BLOOD, LEUKOCYTES, NITRITE, OR GLUCOSE <1000 mg/dL.    Dg Chest Portable 1 View  09/19/2013   CLINICAL DATA:  Chest pain  EXAM: PORTABLE CHEST - 1 VIEW  COMPARISON:  09/05/2013  FINDINGS: Left-sided pacemaker single continuously overlies normal cardiac silhouette. No effusion, infiltrate, or pneumothorax. Normal pulmonary vasculature.  IMPRESSION: No acute cardiopulmonary process.   Electronically Signed   By: Suzy Bouchard M.D.   On: 09/19/2013 18:02    Assessment/Plan: 73 year old man with multiple medical problems including old left cerebral infarction with severe residual right hemiparesis, as well as partial seizure disorder, presenting with recurrent partial seizure activity. He has significant side effects with current dose of Keppra. Seizures are not controlled with Keppra and an increasing Keppra is not likely to be  tolerated well.  Recommendations: 1. Depacon 1000 mg IV loading dose. 2. Depakote 500 mg twice a day 3. Discontinue Keppra. 4. Depakote level in the a.m.  We will continue to follow this patient with you.  C.R. Nicole Kindred, MD [facility] [phone]  09/19/2013, 8:55 PM

## 2013-09-19 NOTE — ED Notes (Signed)
Per EMS: Pt from home with family reporting that pt took a nap and family states that they weren't able to wake him. Family reports "sleep attacks" ever since stating Lorazepam recently. Pt unreposonsive to pain on EMS arrival; diaphoretic, clammy. Given  2 mg Narcan with some improvement. While en route, pt noted to have weak radial pulses, BP 73/48, following commands. Given 500 m NS.

## 2013-09-19 NOTE — ED Notes (Signed)
Admitting MD at bedside.

## 2013-09-19 NOTE — H&P (Signed)
[facility] History and Physical  Patient: Frank ChromanJames L [name]  GUY:[id number]  DOB: 10/04/1940  DOS: the patient was seen and examined on 09/19/2013 PCP: Frank BarreJames John, MD  Chief Complaint: Unresponsiveness  HPI: [name] is a 73 y.o. 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 the patient presented with complaints of an episode of unresponsiveness.patient was started on Remeron last night for the first time and during the day and the family was not able to wake him up at all and therefore they called EMS.on EMS arrival they found that his blood sugar was 1 300, blood pressure was 70s over 40s, heart rate was in the 40s and they brought the patient to the hospital.patient's blood pressure improved with IV fluids and at the time of my evaluation as per the wife the patient was at his baseline. At his baseline the patient is primarily wheelchair-bound answers questions appropriately and dependent on his ADL somewhat. There was no incontinence of urine or bowels. There was no tonic-clonic body activity. Patient has been diagnosed with partial seizure and was started on Keppra which was changed to Keppra extended release due to increased daytime sleepiness. His Zoloft was changed to Remeron yesterday as the patient wasn't able to sleep in the night! Patient has been presenting to the ED and his PCP with multiple episodes of blood pressure in the 80s as well as low 100s improving with IV hydration.  no fever no chills no cough no choking no chest pain no shortness of breath no abdominal pain no nausea no vomiting no diarrhea no loss of control of bowel or bladder no fall no trauma no injury reported  The patient is coming from home. And at his baseline dependent for most of his ADL.  Review of  Systems: as mentioned in the history of present illness.  A Comprehensive review of the other systems is negative.  Past Medical History  Diagnosis Date  . 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 Automatic implantable cardiac defibrillator in situ   . Congestive heart failure, unspecified   . CVA (cerebral vascular accident)   . Retention of urine, unspecified   . Pneumonia, organism unspecified   . Cough   . Acute bronchitis   . Contusion of foot   . Other sign and symptom in breast   . Fibromyalgia   . Hyperlipidemia   . Coronary atherosclerosis of unspecified type of vessel, native or graft   . Esophageal reflux   . Type I (juvenile type) diabetes mellitus without mention of complication, uncontrolled   . Essential hypertension, benign   . Cocaine abuse   . Alcohol abuse   . Microalbuminuria   . GERD (gastroesophageal reflux disease)   . Arteriovenous malformation (AVM)     of colon  . Diverticulosis   . GI bleed     secondary to avm   Past Surgical History  Procedure Laterality Date  . Back surgery      X 2  . Tonsillectomy    . Cardiac defibrillator placement     Social History:  reports that he has been smoking Cigarettes.  He has a 110 pack-year smoking history. He has never used smokeless tobacco. He reports that he does not drink alcohol or use illicit drugs.  Allergies  Allergen Reactions  . Metformin Diarrhea    Family  History  Problem Relation Age of Onset  . Heart failure Mother   . Diabetes Sister   . Colon cancer Brother     questionable  . Prostate cancer Neg Hx     Prior to Admission medications   Medication Sig Start Date End Date Taking? Authorizing Provider  carvedilol (COREG) 25 MG tablet Take 0.5 tablets (12.5 mg total) by mouth 2 (two) times daily with a meal. 03/04/13  Yes Jacques Navy, MD  cloNIDine (CATAPRES) 0.2 MG tablet Take 1 tablet (0.2 mg total) by mouth 2 (two) times daily. 03/14/13  Yes Jacques Navy,  MD  finasteride (PROSCAR) 5 MG tablet Take 1 tablet (5 mg total) by mouth daily. 03/14/13  Yes Jacques Navy, MD  furosemide (LASIX) 40 MG tablet Take 1 tablet (40 mg total) by mouth daily. 03/04/13  Yes Jacques Navy, MD  hydrALAZINE (APRESOLINE) 25 MG tablet Take 1 tablet (25 mg total) by mouth 3 (three) times daily. 03/04/13  Yes Jacques Navy, MD  insulin NPH Human (NOVOLIN N) 100 UNIT/ML injection Inject 0.3 mLs (30 Units total) into the skin daily. Per Vial with syringe/needle 08/07/13  Yes Corwin Levins, MD  levETIRAcetam (KEPPRA XR) 500 MG 24 hr tablet Take 1,000 mg by mouth at bedtime. Takes 2 tabs   Yes Historical Provider, MD  mirtazapine (REMERON) 30 MG tablet Take 1 tablet (30 mg total) by mouth at bedtime. 09/18/13  Yes Corwin Levins, MD  potassium chloride SA (K-DUR,KLOR-CON) 20 MEQ tablet Take 1 tablet (20 mEq total) by mouth 2 (two) times daily. 03/04/13  Yes Jacques Navy, MD  simvastatin (ZOCOR) 40 MG tablet Take 1 tablet (40 mg total) by mouth at bedtime. 03/04/13  Yes Jacques Navy, MD  tamsulosin (FLOMAX) 0.4 MG CAPS capsule Take 1 capsule (0.4 mg total) by mouth daily. 03/04/13  Yes Jacques Navy, MD  valsartan (DIOVAN) 320 MG tablet Take 1 tablet (320 mg total) by mouth daily. 03/04/13  Yes Jacques Navy, MD    Physical Exam: Filed Vitals:   09/19/13 1900 09/19/13 1930 09/19/13 2000 09/19/13 2016  BP: 125/53 140/56 128/63 140/66  Pulse: 65 74 72 65  Temp:      TempSrc:      Resp: 15 15 13 13   SpO2: 100% 100% 100%     General: Alert, Awake and Oriented to Time, Place and Person. Appear in mild distress Eyes: PERRL ENT: Oral Mucosa clear moist. Neck:  no  JVD,  Cardiovascular: S1 and S2 Present,  no  Murmur, Peripheral Pulses Present Respiratory: Bilateral Air entry equal and Decreased, Clear to Auscultation, note Crackles,  no wheezes Abdomen: Bowel Sound Present, Soft and Non tender Skin:  no Rash Extremities: bilateral Pedal edema,  no  calf  tenderness Neurologic: Grossly no focal neuro deficit, other than residual right-sided weakness as well as loss of sensation in upper or lower extremity as well as face.  Labs on Admission:  CBC:  Recent Labs Lab 09/18/13 1218 09/19/13 1707  WBC 7.8 8.0  NEUTROABS 5.6 5.7  HGB 9.6* 8.7*  HCT 28.5* 27.2*  MCV 95.1 99.6  PLT 242.0 215    CMP     Component Value Date/Time   NA 143 09/19/2013 1707   K 4.2 09/19/2013 1707   CL 106 09/19/2013 1707   CO2 27 09/19/2013 1707   GLUCOSE 238* 09/19/2013 1707   BUN 22 09/19/2013 1707   CREATININE 1.40* 09/19/2013 1707  CALCIUM 8.3* 09/19/2013 1707   PROT 5.7* 09/19/2013 1707   ALBUMIN 2.8* 09/19/2013 1707   AST 11 09/19/2013 1707   ALT 11 09/19/2013 1707   ALKPHOS 64 09/19/2013 1707   BILITOT 0.3 09/19/2013 1707   GFRNONAA 48* 09/19/2013 1707   GFRAA 56* 09/19/2013 1707    No results found for this basename: LIPASE, AMYLASE,  in the last 168 hours No results found for this basename: AMMONIA,  in the last 168 hours  No results found for this basename: CKTOTAL, CKMB, CKMBINDEX, TROPONINI,  in the last 168 hours BNP (last 3 results)  Recent Labs  12/17/12 1227 06/28/13 1440 09/19/13 1707  PROBNP 926.7* 1400.0* 920.2*    Radiological Exams on Admission: Dg Chest Portable 1 View  09/19/2013   CLINICAL DATA:  Chest pain  EXAM: PORTABLE CHEST - 1 VIEW  COMPARISON:  09/05/2013  FINDINGS: Left-sided pacemaker single continuously overlies normal cardiac silhouette. No effusion, infiltrate, or pneumothorax. Normal pulmonary vasculature.  IMPRESSION: No acute cardiopulmonary process.   Electronically Signed   By: Genevive Bi M.D.   On: 09/19/2013 18:02    EKG: Independently reviewed. normal sinus rhythm, nonspecific ST and T waves changes. Assessment/Plan Principal Problem:   Hypotension Active Problems:   HYPERLIPIDEMIA   HYPERTENSION, BENIGN ESSENTIAL   CORONARY ARTERY DISEASE   GERD   ICD-[employer]   Seizure  disorder   1. Hypotension  the patient is presenting with complaints of periods of unresponsiveness, increased sleepiness, hypotension. He has mild acute kidney injury. Blood pressure has significantly improved from 70 systolic to 140 systolic after IV fluid bolus. With this the patient is currently being admitted in the hospital. Possible etiology is medication side effect with combination of Remeron and Keppra. Also possible etiology include seizures, polypharmacy with 4 antihypertensive and on diuretic medication, ICD malfunction, arrhythmia. I will monitor him on telemetry. Get limited echocardiogram in the morning, check ICD function. Neurology has been consulted to adjust patient's seizure medication. I would hold his Remeron. I would also hold his Lasix and valsartan at present he did I recommend to completely stop his hydralazine. Currently I would continue with Coreg and clonidine starting tomorrow. Will check vitamin B12, folate acid level, TSH, troponin.  2. Seizure disorder Patient is on Keppra and has increased sleepiness with combination of Remeron. Neurology has been consulted and will follow the recommendation I appreciate their input.  3.Diabetes mellitus. Well-controlled hemoglobin A1c 7.1. Continue him on NPH from tomorrow and placing him on sliding scale.  4.Chronic combined systolic and diastolic heart failure. At present does not appear volume overloaded Holding Lasix for one dose and resume at the time of discharge   Consults:  neurology, appreciate their input  DVT Prophylaxis: subcutaneous Heparin nutrition:  Cardiac and diabetic diet with aspiration precaution  Code Status:  full   Family Communication:  wife  was present at bedside, opportunity was given to ask question and all questions were answered satisfactorily at the time of interview. Disposition: Admitted to inpatient in telemetry unit.  Author: Lynden Oxford, MD [facility] Pager:  [phone] 09/19/2013, 8:25 PM    If 7PM-7AM, please contact night-coverage [email or web address] Password [email or web address]  **Disclaimer: This note may have been dictated with voice recognition software. Similar sounding words can inadvertently be transcribed and this note may contain transcription errors which may not have been corrected upon publication of note.**

## 2013-09-19 NOTE — ED Notes (Signed)
Transporting patient to new room assignment. 

## 2013-09-20 ENCOUNTER — Encounter (HOSPITAL_COMMUNITY): Payer: Self-pay | Admitting: *Deleted

## 2013-09-20 DIAGNOSIS — I9589 Other hypotension: Secondary | ICD-10-CM

## 2013-09-20 DIAGNOSIS — R5381 Other malaise: Secondary | ICD-10-CM | POA: Insufficient documentation

## 2013-09-20 DIAGNOSIS — R5383 Other fatigue: Secondary | ICD-10-CM

## 2013-09-20 DIAGNOSIS — T50904A Poisoning by unspecified drugs, medicaments and biological substances, undetermined, initial encounter: Secondary | ICD-10-CM

## 2013-09-20 DIAGNOSIS — R1319 Other dysphagia: Secondary | ICD-10-CM | POA: Insufficient documentation

## 2013-09-20 LAB — CBC WITH DIFFERENTIAL/PLATELET
BASOS ABS: 0 10*3/uL (ref 0.0–0.1)
BASOS PCT: 0 % (ref 0–1)
Eosinophils Absolute: 0 10*3/uL (ref 0.0–0.7)
Eosinophils Relative: 0 % (ref 0–5)
HCT: 27.6 % — ABNORMAL LOW (ref 39.0–52.0)
Hemoglobin: 8.8 g/dL — ABNORMAL LOW (ref 13.0–17.0)
LYMPHS PCT: 13 % (ref 12–46)
Lymphs Abs: 1.3 10*3/uL (ref 0.7–4.0)
MCH: 31.5 pg (ref 26.0–34.0)
MCHC: 31.9 g/dL (ref 30.0–36.0)
MCV: 98.9 fL (ref 78.0–100.0)
Monocytes Absolute: 0.8 10*3/uL (ref 0.1–1.0)
Monocytes Relative: 7 % (ref 3–12)
NEUTROS ABS: 8.2 10*3/uL — AB (ref 1.7–7.7)
Neutrophils Relative %: 80 % — ABNORMAL HIGH (ref 43–77)
Platelets: 219 10*3/uL (ref 150–400)
RBC: 2.79 MIL/uL — ABNORMAL LOW (ref 4.22–5.81)
RDW: 14 % (ref 11.5–15.5)
WBC: 10.3 10*3/uL (ref 4.0–10.5)

## 2013-09-20 LAB — GLUCOSE, CAPILLARY
GLUCOSE-CAPILLARY: 51 mg/dL — AB (ref 70–99)
Glucose-Capillary: 58 mg/dL — ABNORMAL LOW (ref 70–99)
Glucose-Capillary: 118 mg/dL — ABNORMAL HIGH (ref 70–99)
Glucose-Capillary: 119 mg/dL — ABNORMAL HIGH (ref 70–99)
Glucose-Capillary: 138 mg/dL — ABNORMAL HIGH (ref 70–99)
Glucose-Capillary: 120 mg/dL — ABNORMAL HIGH (ref 70–99)

## 2013-09-20 LAB — COMPREHENSIVE METABOLIC PANEL
ALBUMIN: 2.7 g/dL — AB (ref 3.5–5.2)
ALT: 12 U/L (ref 0–53)
AST: 15 U/L (ref 0–37)
Alkaline Phosphatase: 63 U/L (ref 39–117)
Anion gap: 9 (ref 5–15)
BILIRUBIN TOTAL: 0.2 mg/dL — AB (ref 0.3–1.2)
BUN: 20 mg/dL (ref 6–23)
CHLORIDE: 111 meq/L (ref 96–112)
CO2: 24 mEq/L (ref 19–32)
Calcium: 8.4 mg/dL (ref 8.4–10.5)
Creatinine, Ser: 1.16 mg/dL (ref 0.50–1.35)
GFR calc Af Amer: 70 mL/min — ABNORMAL LOW (ref >90)
GFR, EST NON AFRICAN AMERICAN: 61 mL/min — AB (ref >90)
Glucose, Bld: 61 mg/dL — ABNORMAL LOW (ref 70–99)
Potassium: 4.8 mEq/L (ref 3.7–5.3)
Sodium: 144 mEq/L (ref 137–147)
Total Protein: 5.9 g/dL — ABNORMAL LOW (ref 6.0–8.3)

## 2013-09-20 LAB — PROTIME-INR
INR: 1.17 (ref 0.00–1.49)
Prothrombin Time: 14.9 seconds (ref 11.6–15.2)

## 2013-09-20 LAB — VITAMIN B12: Vitamin B-12: 840 pg/mL (ref 211–911)

## 2013-09-20 LAB — FOLATE: Folate: 9.5 ng/mL

## 2013-09-20 MED ORDER — INSULIN ASPART 100 UNIT/ML ~~LOC~~ SOLN
0.0000 [IU] | Freq: Three times a day (TID) | SUBCUTANEOUS | Status: DC
  Administered 2013-09-20: 1 [IU] via SUBCUTANEOUS
  Administered 2013-09-21: 2 [IU] via SUBCUTANEOUS

## 2013-09-20 MED ORDER — INSULIN NPH (HUMAN) (ISOPHANE) 100 UNIT/ML ~~LOC~~ SUSP
15.0000 [IU] | Freq: Every day | SUBCUTANEOUS | Status: DC
  Administered 2013-09-20: 15 [IU] via SUBCUTANEOUS
  Filled 2013-09-20: qty 10

## 2013-09-20 MED ORDER — DIVALPROEX SODIUM 500 MG PO DR TAB
500.0000 mg | DELAYED_RELEASE_TABLET | Freq: Two times a day (BID) | ORAL | Status: DC
  Administered 2013-09-20 – 2013-09-22 (×5): 500 mg via ORAL
  Filled 2013-09-20 (×6): qty 1

## 2013-09-20 MED ORDER — DEXTROSE 50 % IV SOLN
25.0000 mL | INTRAVENOUS | Status: DC | PRN
  Administered 2013-09-20: 25 mL via INTRAVENOUS
  Filled 2013-09-20: qty 50

## 2013-09-20 NOTE — Progress Notes (Signed)
Patient arrived from ED, wife at bedside. Oriented to room and unit. VS stable. Patient oriented x 2 to self and place. Arousable and answers questions appropriately. Assessment completed, admission assessment done with wife at bedside. Will continue to monitor.  Daneil DanSpencer, Natanel Snavely M RN

## 2013-09-20 NOTE — Progress Notes (Addendum)
Glucose on bmp 61, FSBS 58, pt awake alert oriented,  No s/s of hypoglycemia, given po crackers and milk recheck fsbs 51,  D 50 25g  given.  Repeat fsbs 118.

## 2013-09-20 NOTE — Assessment & Plan Note (Signed)
No overt bleeding, for f/u cbc,  to f/u any worsening symptoms or concerns 

## 2013-09-20 NOTE — Progress Notes (Signed)
Nutrition Brief Note  Patient identified on the Malnutrition Screening Tool (MST) Report  Wt Readings from Last 15 Encounters:  09/20/13 191 lb 14.4 oz (87.045 kg)  08/21/13 216 lb (97.977 kg)  12/19/12 204 lb 4.8 oz (92.67 kg)  12/16/12 208 lb 5.4 oz (94.5 kg)  01/01/12 225 lb (102.059 kg)  01/01/12 225 lb (102.059 kg)  02/21/10 223 lb (101.152 kg)  01/21/10 223 lb (101.152 kg)  06/02/09 223 lb (101.152 kg)  04/27/09 223 lb (101.152 kg)  02/12/09 224 lb 4 oz (101.719 kg)  10/23/08 222 lb (100.699 kg)  04/14/08 221 lb (100.245 kg)  04/09/08 227 lb (102.967 kg)  01/14/08 224 lb 9.6 oz (101.878 kg)    Body mass index is 30.99 kg/(m^2). Patient meets criteria for obesity based on current BMI.   Current diet order is heart healthy, carbohydrate modified, patient is consuming approximately 100% of meals at this time. Labs and medications reviewed.   No nutrition interventions warranted at this time. If nutrition issues arise, please consult RD.   Ebbie LatusHaley Hawkins RD, LDN

## 2013-09-20 NOTE — Progress Notes (Signed)
PROGRESS NOTE  [name] ZHY:[id number] DOB: 18-Nov-1940 DOA: 09/19/2013 PCP: Frank Barre, MD  Assessment/Plan:  [name] Setting episode thought to be due to seizures Patient is on Keppra and has increased sleepiness with combination of Remeron.  Neurology has been consulted: 1. Depacon 1000 mg IV loading dose.  2. Depakote 500 mg twice a day  3. Discontinue Keppra.  4. Depakote level in the a.m.  Hypotension  - periods of unresponsiveness, increased sleepiness, hypotension.  -mild acute kidney injury.  -Blood pressure improved after IV fluid bolus.  -echocardiogram in the morning  check vitamin B12, folate acid level, TSH, troponin.   Diabetes mellitus.  Un-controlled hemoglobin A1c 7.1.  Continue him on NPH from tomorrow and placing him on sliding scale.   Chronic combined systolic and diastolic heart failure.  At present does not appear volume overloaded  Holding Lasix for now and resume at the time of discharge    Code Status: full Family Communication:  Disposition Plan:    Consultants:  neuro  Procedures:      HPI/Subjective: No SOB, no CP Feeling better   Objective: Filed Vitals:   09/20/13 0513  BP: 166/61  Pulse: 66  Temp: 98.4 F (36.9 C)  Resp: 18    Intake/Output Summary (Last 24 hours) at 09/20/13 0943 Last data filed at 09/19/13 2004  Gross per 24 hour  Intake   2000 ml  Output      0 ml  Net   2000 ml   Filed Weights   09/19/13 2146 09/20/13 0513  Weight: 89.9 kg (198 lb 3.1 oz) 87.045 kg (191 lb 14.4 oz)    Exam:   General:  A+Ox3, NAD  Cardiovascular: rrr  Respiratory: no wheezing, no increase work of breathing  Abdomen: +BS, soft  Musculoskeletal:    Data Reviewed: Basic Metabolic Panel:  Recent Labs Lab 09/18/13 1218 09/19/13 1707 09/20/13 0439  NA 137  138 143 144  K 3.8  3.9 4.2 4.8  CL 101  103 106 111  CO2 27  29 27 24   GLUCOSE 221*  226* 238* 61*  BUN 24*  24* 22 20  CREATININE 1.2  1.2  1.40* 1.16  CALCIUM 8.4  8.6 8.3* 8.4   Liver Function Tests:  Recent Labs Lab 09/18/13 1218 09/19/13 1707 09/20/13 0439  AST 18  15 11 15   ALT 13  13 11 12   ALKPHOS 60  60 64 63  BILITOT 0.4  0.4 0.3 0.2*  PROT 6.6  6.5 5.7* 5.9*  ALBUMIN 3.1*  3.1* 2.8* 2.7*   No results found for this basename: LIPASE, AMYLASE,  in the last 168 hours No results found for this basename: AMMONIA,  in the last 168 hours CBC:  Recent Labs Lab 09/18/13 1218 09/19/13 1707 09/20/13 0439  WBC 7.8 8.0 10.3  NEUTROABS 5.6 5.7 8.2*  HGB 9.6* 8.7* 8.8*  HCT 28.5* 27.2* 27.6*  MCV 95.1 99.6 98.9  PLT 242.0 215 219   Cardiac Enzymes:  Recent Labs Lab 09/19/13 2111  TROPONINI <0.30   BNP (last 3 results)  Recent Labs  12/17/12 1227 06/28/13 1440 09/19/13 1707  PROBNP 926.7* 1400.0* 920.2*   CBG:  Recent Labs Lab 09/19/13 2245 09/19/13 2311 09/20/13 0621 09/20/13 0636 09/20/13 0659  GLUCAP 34* 156* 58* 51* 118*    No results found for this or any previous visit (from the past 240 hour(s)).   Studies: Dg Chest Portable 1 View  09/19/2013   CLINICAL DATA:  Chest  pain  EXAM: PORTABLE CHEST - 1 VIEW  COMPARISON:  09/05/2013  FINDINGS: Left-sided pacemaker single continuously overlies normal cardiac silhouette. No effusion, infiltrate, or pneumothorax. Normal pulmonary vasculature.  IMPRESSION: No acute cardiopulmonary process.   Electronically Signed   By: Frank BiStewart  Edmunds M.D.   On: 09/19/2013 18:02    Scheduled Meds: . carvedilol  12.5 mg Oral BID WC  . cloNIDine  0.2 mg Oral BID  . divalproex  500 mg Oral Q12H  . finasteride  5 mg Oral Daily  . heparin  5,000 Units Subcutaneous 3 times per day  . insulin aspart  0-9 Units Subcutaneous TID WC  . potassium chloride SA  20 mEq Oral BID  . simvastatin  40 mg Oral QHS  . sodium chloride  3 mL Intravenous Q12H  . tamsulosin  0.4 mg Oral Daily   Continuous Infusions:  Antibiotics Given (last 72 hours)   None       Principal Problem:   Hypotension Active Problems:   HYPERLIPIDEMIA   HYPERTENSION, BENIGN ESSENTIAL   CORONARY ARTERY DISEASE   GERD   ICD-[employer]   Seizure disorder    Time spent: 35 min    [name]  [facility] Pager [phone]. If 7PM-7AM, please contact night-coverage at [email or web address], password [email or web address] 09/20/2013, 9:43 AM  LOS: 1 day

## 2013-09-20 NOTE — Assessment & Plan Note (Signed)
With sleep difficulty, ? Worsening depression, low appetite - for change zoloft to remeron 30 qhs, consider psychiatry referral

## 2013-09-20 NOTE — Assessment & Plan Note (Addendum)
Volume stable, but for HH with PT, and [facility] eval, cont current meds  Note:  Total time for pt hx, exam, review of record with pt in the room, determination of diagnoses and plan for further eval and tx is > 40 min, with over 50% spent in coordination and counseling of patient

## 2013-09-20 NOTE — Assessment & Plan Note (Signed)
For speech path eval, also GI referral, ? Need EGD

## 2013-09-20 NOTE — Assessment & Plan Note (Signed)
Has planned neurology appt, cont keppra

## 2013-09-20 NOTE — Assessment & Plan Note (Signed)
stable overall by history and exam, recent data reviewed with pt, and pt to continue medical treatment as before,  to f/u any worsening symptoms or concerns Lab Results  Component Value Date   HGBA1C 7.1* 09/18/2013

## 2013-09-20 NOTE — Assessment & Plan Note (Signed)
stable overall by history and exam, recent data reviewed with pt, and pt to continue medical treatment as before,  to f/u any worsening symptoms or concerns BP Readings from Last 3 Encounters:  09/20/13 140/38  09/18/13 122/80  09/06/13 147/50

## 2013-09-20 NOTE — ED Provider Notes (Signed)
I saw and evaluated the patient, reviewed the resident's note and I agree with the findings and plan.   EKG Interpretation   Date/Time:  Friday September 19 2013 16:55:04 EDT Ventricular Rate:  64 PR Interval:  287 QRS Duration: 158 QT Interval:  525 QTC Calculation: 542 R Axis:   -97 Text Interpretation:  Sinus rhythm Paired ventricular premature complexes  Sinus pause Prolonged PR interval RBBB and LAFB Abnormal lateral Q waves  Confirmed by Rubin PayorPICKERING  MD, Harrold DonathNATHAN [id number]([id number]) on 09/20/2013 2:55:50 PM     Patient with mental status changes. Episodic. Has been considered she is in the past, however could also be related to pacemaker/AICD. Will admit to internal medicine. Pacemaker has been interrogated.  Juliet RudeNathan R. Rubin PayorPickering, MD 09/20/13 [id number]

## 2013-09-21 LAB — GLUCOSE, CAPILLARY
GLUCOSE-CAPILLARY: 94 mg/dL (ref 70–99)
GLUCOSE-CAPILLARY: 99 mg/dL (ref 70–99)
Glucose-Capillary: 50 mg/dL — ABNORMAL LOW (ref 70–99)
Glucose-Capillary: 83 mg/dL (ref 70–99)
Glucose-Capillary: 158 mg/dL — ABNORMAL HIGH (ref 70–99)

## 2013-09-21 LAB — VALPROIC ACID LEVEL: Valproic Acid Lvl: 35.3 ug/mL — ABNORMAL LOW (ref 50.0–100.0)

## 2013-09-21 MED ORDER — INSULIN NPH (HUMAN) (ISOPHANE) 100 UNIT/ML ~~LOC~~ SUSP
5.0000 [IU] | Freq: Two times a day (BID) | SUBCUTANEOUS | Status: DC
  Administered 2013-09-21 – 2013-09-22 (×2): 5 [IU] via SUBCUTANEOUS
  Filled 2013-09-21: qty 10

## 2013-09-21 MED ORDER — VALPROATE SODIUM 500 MG/5ML IV SOLN
1.0000 g | Freq: Once | INTRAVENOUS | Status: AC
  Administered 2013-09-22: 1000 mg via INTRAVENOUS
  Filled 2013-09-21: qty 10

## 2013-09-21 NOTE — Progress Notes (Addendum)
PROGRESS NOTE  Frank ChromanJames L [name] ZOX:[id number] DOB: 10/11/1940 DOA: 09/19/2013 PCP: Frank BarreJames John, MD  Assessment/Plan:  Frank SettingUnresponsive episode thought to be due to seizures Patient is on Keppra and has increased sleepiness with combination of Remeron.  Neurology has been consulted: 1. Depacon 1000 mg IV loading dose.  2. Depakote 500 mg twice a day  3. Discontinue Keppra.  4. Depakote level pending  Hypotension  - periods of unresponsiveness, increased sleepiness, hypotension.  -mild acute kidney injury.  -Blood pressure improved after IV fluid bolus.   vitamin B12, folate acid level, TSH, troponin all ok  Diabetes mellitus.   hemoglobin A1c 7.1.  Continue him on low dose NPH and placing him on sliding scale.   Chronic combined systolic and diastolic heart failure.  At present does not appear volume overloaded  Holding Lasix for now and resume at the time of discharge   Spoke with cards on call regarding AICD interrogation- no changes for now- follow cards outpatient (taylor)  Code Status: full Family Communication: wife on phone Disposition Plan: PT eval, depakote level,  and hope fully home in AM   Consultants:  neuro  Procedures:      HPI/Subjective: No SOB, no CP Feeling better   Objective: Filed Vitals:   09/21/13 0732  BP:   Pulse: 56  Temp:   Resp:     Intake/Output Summary (Last 24 hours) at 09/21/13 0908 Last data filed at 09/21/13 0513  Gross per 24 hour  Intake    123 ml  Output   1100 ml  Net   -977 ml   Filed Weights   09/19/13 2146 09/20/13 0513 09/21/13 0510  Weight: 89.9 kg (198 lb 3.1 oz) 87.045 kg (191 lb 14.4 oz) 89.767 kg (197 lb 14.4 oz)    Exam:   General:  A+Ox3, NAD  Cardiovascular: rrr  Respiratory: no wheezing, no increase work of breathing  Abdomen: +BS, soft   Data Reviewed: Basic Metabolic Panel:  Recent Labs Lab 09/18/13 1218 09/19/13 1707 09/20/13 0439  NA 137  138 143 144  K 3.8  3.9 4.2 4.8  CL 101   103 106 111  CO2 27  29 27 24   GLUCOSE 221*  226* 238* 61*  BUN 24*  24* 22 20  CREATININE 1.2  1.2 1.40* 1.16  CALCIUM 8.4  8.6 8.3* 8.4   Liver Function Tests:  Recent Labs Lab 09/18/13 1218 09/19/13 1707 09/20/13 0439  AST 18  15 11 15   ALT 13  13 11 12   ALKPHOS 60  60 64 63  BILITOT 0.4  0.4 0.3 0.2*  PROT 6.6  6.5 5.7* 5.9*  ALBUMIN 3.1*  3.1* 2.8* 2.7*   No results found for this basename: LIPASE, AMYLASE,  in the last 168 hours No results found for this basename: AMMONIA,  in the last 168 hours CBC:  Recent Labs Lab 09/18/13 1218 09/19/13 1707 09/20/13 0439  WBC 7.8 8.0 10.3  NEUTROABS 5.6 5.7 8.2*  HGB 9.6* 8.7* 8.8*  HCT 28.5* 27.2* 27.6*  MCV 95.1 99.6 98.9  PLT 242.0 215 219   Cardiac Enzymes:  Recent Labs Lab 09/19/13 2111  TROPONINI <0.30   BNP (last 3 results)  Recent Labs  12/17/12 1227 06/28/13 1440 09/19/13 1707  PROBNP 926.7* 1400.0* 920.2*   CBG:  Recent Labs Lab 09/20/13 1134 09/20/13 1614 09/20/13 2124 09/21/13 0624 09/21/13 0705  GLUCAP 119* 138* 120* 50* 83    No results found for this or any previous  visit (from the past 240 hour(s)).   Studies: Dg Chest Portable 1 View  09/19/2013   CLINICAL DATA:  Chest pain  EXAM: PORTABLE CHEST - 1 VIEW  COMPARISON:  09/05/2013  FINDINGS: Left-sided pacemaker single continuously overlies normal cardiac silhouette. No effusion, infiltrate, or pneumothorax. Normal pulmonary vasculature.  IMPRESSION: No acute cardiopulmonary process.   Electronically Signed   By: [name] M.D.   On: 09/19/2013 18:02    Scheduled Meds: . carvedilol  12.5 mg Oral BID WC  . cloNIDine  0.2 mg Oral BID  . divalproex  500 mg Oral Q12H  . finasteride  5 mg Oral Daily  . heparin  5,000 Units Subcutaneous 3 times per day  . insulin aspart  0-9 Units Subcutaneous TID WC  . insulin NPH Human  5 Units Subcutaneous BID AC & HS  . potassium chloride SA  20 mEq Oral BID  . simvastatin  40  mg Oral QHS  . sodium chloride  3 mL Intravenous Q12H  . tamsulosin  0.4 mg Oral Daily   Continuous Infusions:  Antibiotics Given (last 72 hours)   None      Principal Problem:   Hypotension Active Problems:   HYPERLIPIDEMIA   HYPERTENSION, BENIGN ESSENTIAL   CORONARY ARTERY DISEASE   GERD   ICD-[employer]   Seizure disorder    Time spent: 25 min    [name]  [facility] Pager [phone]. If 7PM-7AM, please contact night-coverage at [email or web address], password [facility] 09/21/2013, 9:08 AM  LOS: 2 days

## 2013-09-21 NOTE — Progress Notes (Signed)
NEURO HOSPITALIST PROGRESS NOTE   SUBJECTIVE:                                                                                                                        Resting in bed comfortably and without neurological complains. No further seizures reported. Successfully switched to Depakote 500 mg BID. VPA level pending.  OBJECTIVE:                                                                                                                           Vital signs in last 24 hours: Temp:  [97.5 F (36.4 C)-99.3 F (37.4 C)] 97.5 F (36.4 C) (08/16 0510) Pulse Rate:  [52-68] 56 (08/16 0732) Resp:  [17-18] 18 (08/16 0510) BP: (132-159)/(38-57) 132/45 mmHg (08/16 0510) SpO2:  [94 %-99 %] 94 % (08/15 2128) Weight:  [89.767 kg (197 lb 14.4 oz)] 89.767 kg (197 lb 14.4 oz) (08/16 0510)  Intake/Output from previous day: 08/15 0701 - 08/16 0700 In: 363 [P.O.:360; I.V.:3] Out: 1100 [Urine:1100] Intake/Output this shift:   Nutritional status:    Past Medical History  Diagnosis Date  . 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 Automatic implantable cardiac defibrillator in situ   . Congestive heart failure, unspecified   . CVA (cerebral vascular accident)   . Retention of urine, unspecified   . Pneumonia, organism unspecified   . Cough   . Acute bronchitis   . Contusion of foot   . Other sign and symptom in breast   . Fibromyalgia   . Hyperlipidemia   . Coronary atherosclerosis of unspecified type of vessel, native or graft   . Esophageal reflux   . Type I (juvenile type) diabetes mellitus without mention of complication, uncontrolled   . Essential hypertension, benign   . Cocaine abuse   . Alcohol abuse   . Microalbuminuria   . GERD (gastroesophageal reflux disease)   . Arteriovenous malformation (AVM)     of colon  . Diverticulosis   . GI bleed     secondary to avm    Neurologic Exam:  Mental Status:  Alert,  oriented to person and place but not to correct year/month. Speech moderately slurred with mild word finding difficulty.  Able to follow commands without difficulty.  Cranial Nerves:  II-Visual fields were normal.  III/IV/VI-Pupils were equal and reacted. Extraocular movements were full and conjugate.  V/VII-no facial numbness; mild right lower facial weakness.  VIII-normal.  X-moderate dysarthria.  Motor: Severe weakness with increased muscle tone involving right upper and lower extremities with inability to lift either extremity against gravity. Strength and muscle tone of left extremities were normal.  Sensory: Reduced perception of tactile sensation over right extremities compared to left.  Deep Tendon Reflexes: 2+ and symmetric.  Plantars: Extensor on the right and flexor on the left  Cerebellar: Normal finger-to-nose testing with use of left upper extremity. Coordination and gait: no tested.   Lab Results: Lab Results  Component Value Date/Time   CHOL 127 09/18/2013 12:18 PM   Lipid Panel  Recent Labs  09/18/13 1218  CHOL 127  TRIG 184.0*  HDL 27.60*  CHOLHDL 5  VLDL 36.8  LDLCALC 63    Studies/Results: Dg Chest Portable 1 View  09/19/2013   CLINICAL DATA:  Chest pain  EXAM: PORTABLE CHEST - 1 VIEW  COMPARISON:  09/05/2013  FINDINGS: Left-sided pacemaker single continuously overlies normal cardiac silhouette. No effusion, infiltrate, or pneumothorax. Normal pulmonary vasculature.  IMPRESSION: No acute cardiopulmonary process.   Electronically Signed   By: Genevive Bi M.D.   On: 09/19/2013 18:02    MEDICATIONS                                                                                                                        Scheduled: . carvedilol  12.5 mg Oral BID WC  . cloNIDine  0.2 mg Oral BID  . divalproex  500 mg Oral Q12H  . finasteride  5 mg Oral Daily  . heparin  5,000 Units Subcutaneous 3 times per day  . insulin aspart  0-9 Units Subcutaneous TID WC   . insulin NPH Human  5 Units Subcutaneous BID AC & HS  . potassium chloride SA  20 mEq Oral BID  . simvastatin  40 mg Oral QHS  . sodium chloride  3 mL Intravenous Q12H  . tamsulosin  0.4 mg Oral Daily    ASSESSMENT/PLAN:                                                                                                           73 y/o with structural post stroke partial onset seizures, admitted to [facility] due to a cluster of seizures. He is back to baseline and seems to be tolerating Depakote nicely. Will ensure  that VPA level is within therapeutic range, but otherwise no further neurological intervention needed at this moment.  Wyatt Portelasvaldo Albina Gosney, MD [facility] [phone]  09/21/2013, 9:05 AM

## 2013-09-22 LAB — VALPROIC ACID LEVEL: Valproic Acid Lvl: 53.3 ug/mL (ref 50.0–100.0)

## 2013-09-22 LAB — GLUCOSE, CAPILLARY
GLUCOSE-CAPILLARY: 88 mg/dL (ref 70–99)
Glucose-Capillary: 83 mg/dL (ref 70–99)
Glucose-Capillary: 93 mg/dL (ref 70–99)

## 2013-09-22 MED ORDER — DIVALPROEX SODIUM 500 MG PO DR TAB
500.0000 mg | DELAYED_RELEASE_TABLET | Freq: Two times a day (BID) | ORAL | Status: DC
Start: 2013-09-22 — End: 2013-10-24

## 2013-09-22 MED ORDER — INSULIN NPH (HUMAN) (ISOPHANE) 100 UNIT/ML ~~LOC~~ SUSP: 10.0000 [IU] | Freq: Every day | SUBCUTANEOUS | Status: DC

## 2013-09-22 NOTE — Discharge Instructions (Signed)
Resume zoloft.  

## 2013-09-22 NOTE — Discharge Summary (Signed)
Physician Discharge Summary  [name] ZOX:[id number] DOB: 1940/08/15 DOA: 09/19/2013  PCP: Oliver Barre, MD  Admit date: 09/19/2013 Discharge date: 09/22/2013  Time spent: greater than 30 minutes  Recommendations for Outpatient Follow-up:  1.   Discharge Diagnoses:  Principal Problem:   Hypotension Active Problems:   HYPERLIPIDEMIA   HYPERTENSION, BENIGN ESSENTIAL   CORONARY ARTERY DISEASE   GERD   ICD-[employer]   Seizure disorder bradycardia hypoglycemia  Discharge Condition: stable  Filed Weights   09/20/13 0513 09/21/13 0510 09/22/13 0336  Weight: 87.045 kg (191 lb 14.4 oz) 89.767 kg (197 lb 14.4 oz) 89.9 kg (198 lb 3.1 oz)    History of present illness:  73 y.o. 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  the patient presented with complaints of an episode of unresponsiveness.patient was started on Remeron last night for the first time and during the day and the family was not able to wake him up at all and therefore they called EMS.on EMS arrival they found that his blood sugar was 1 300, blood pressure was 70s over 40s, heart rate was in the 40s and they brought the patient to the hospital.patient's blood pressure improved with IV fluids and at the time of my evaluation as per the wife the patient was at his baseline.  At his baseline the patient is primarily wheelchair-bound answers questions appropriately and dependent on his ADL somewhat.  There was no incontinence of urine or bowels. There was no tonic-clonic body activity. Patient has been diagnosed with partial seizure and was started on Keppra which was changed to Keppra extended release due to increased daytime sleepiness.  His Zoloft was changed to Remeron yesterday as the patient wasn't able to sleep in the  night!  Patient has been presenting to the ED and his PCP with multiple episodes of blood pressure in the 80s as well as low 100s improving with IV hydration.  no fever no chills no cough no choking no chest pain no shortness of breath no abdominal pain no nausea no vomiting no diarrhea no loss of control of bowel or bladder no fall no trauma no injury reported  Hospital Course:  Unresponsive episode thought to be due to seizures  Prior to admission, Patient was on Keppra and has increased sleepiness with combination of Remeron.  Neurology has been consulted:  1. Depacon 1000 mg IV loading dose.  2. Depakote 500 mg twice a day  3. Discontinue Keppra.  4. Valproic acid therapeutic at discharge  Hypotension  - periods of unresponsiveness, increased sleepiness, hypotension.  -mild acute kidney injury.  -Blood pressure improved after IV fluid bolus. Antihypertensives adjusted vitamin B12, folate acid level, TSH, troponin all ok   Diabetes mellitus.  hemoglobin A1c 7.1.  NPH dose decreased due to hypoglycemia on admission  Chronic combined systolic and diastolic heart failure.  Compensated. Lasix held initially due to hypotension. May be resumed.  Spoke with cards on call regarding AICD interrogation- no changes for now- follow cards outpatient (taylor)   Procedures:  none  Consultations:  neurology  Discharge Exam: Filed Vitals:   09/22/13 0336  BP: 145/55  Pulse: 55  Temp: 98.2 F (36.8 C)  Resp: 18    General: asleep. arousable Cardiovascular: RRR without MGR Respiratory: CTA without WRR       Discharge Instructions   Diet - low sodium heart  healthy    Complete by:  As directed      Diet Carb Modified    Complete by:  As directed      Discharge instructions    Complete by:  As directed   Resume zoloft     Walk with assistance    Complete by:  As directed             Medication List    STOP taking these medications       cloNIDine 0.2 MG tablet   Commonly known as:  CATAPRES     hydrALAZINE 25 MG tablet  Commonly known as:  APRESOLINE     levETIRAcetam 500 MG 24 hr tablet  Commonly known as:  KEPPRA XR     mirtazapine 30 MG tablet  Commonly known as:  REMERON      TAKE these medications       carvedilol 25 MG tablet  Commonly known as:  COREG  Take 0.5 tablets (12.5 mg total) by mouth 2 (two) times daily with a meal.     divalproex 500 MG DR tablet  Commonly known as:  DEPAKOTE  Take 1 tablet (500 mg total) by mouth every 12 (twelve) hours.     finasteride 5 MG tablet  Commonly known as:  PROSCAR  Take 1 tablet (5 mg total) by mouth daily.     furosemide 40 MG tablet  Commonly known as:  LASIX  Take 1 tablet (40 mg total) by mouth daily.     insulin NPH Human 100 UNIT/ML injection  Commonly known as:  NOVOLIN N  Inject 0.1 mLs (10 Units total) into the skin daily. Per Vial with syringe/needle     potassium chloride SA 20 MEQ tablet  Commonly known as:  K-DUR,KLOR-CON  Take 1 tablet (20 mEq total) by mouth 2 (two) times daily.     simvastatin 40 MG tablet  Commonly known as:  ZOCOR  Take 1 tablet (40 mg total) by mouth at bedtime.     tamsulosin 0.4 MG Caps capsule  Commonly known as:  FLOMAX  Take 1 capsule (0.4 mg total) by mouth daily.     valsartan 320 MG tablet  Commonly known as:  DIOVAN  Take 1 tablet (320 mg total) by mouth daily.      resume zoloft as previous  Allergies  Allergen Reactions  . Metformin Diarrhea   Follow-up Information   Follow up with Oliver BarreJames John, MD In 1 week.   Specialties:  Internal Medicine, Radiology   Contact information:   [address] [facility] [place] [address] [phone]       Follow up with Lewayne BuntingGregg Taylor, MD. (his office will call you for appointment)    Specialty:  Cardiology   Contact information:   [address] [place] [address] [phone]       Follow up with Van ClinesAquino,Karen M, MD In 2 weeks.   Specialty:  Neurology    Contact information:   [address] [place] [address] [phone]        The results of significant diagnostics from this hospitalization (including imaging, microbiology, ancillary and laboratory) are listed below for reference.    Significant Diagnostic Studies: Dg Chest Portable 1 View  09/19/2013   CLINICAL DATA:  Chest pain  EXAM: PORTABLE CHEST - 1 VIEW  COMPARISON:  09/05/2013  FINDINGS: Left-sided pacemaker single continuously overlies normal cardiac silhouette. No effusion, infiltrate, or pneumothorax. Normal  pulmonary vasculature.  IMPRESSION: No acute cardiopulmonary process.   Electronically Signed   By: Genevive Bi M.D.   On: 09/19/2013 18:02   Dg Chest Port 1 View  09/05/2013   CLINICAL DATA:  Weakness  EXAM: PORTABLE CHEST - 1 VIEW  COMPARISON:  06/28/2013  FINDINGS: The heart size remains mildly enlarged without evidence for edema. Both lungs are clear. The visualized skeletal structures are unremarkable. Left AICD in place. Stable prominence of the superior vascular pedicle.  IMPRESSION: Cardiomegaly reidentified without focal acute finding.   Electronically Signed   By: Christiana Pellant M.D.   On: 09/05/2013 19:40    Microbiology: No results found for this or any previous visit (from the past 240 hour(s)).   Labs: Basic Metabolic Panel:  Recent Labs Lab 09/18/13 1218 09/19/13 1707 09/20/13 0439  NA 137  138 143 144  K 3.8  3.9 4.2 4.8  CL 101  103 106 111  CO2 27  29 27 24   GLUCOSE 221*  226* 238* 61*  BUN 24*  24* 22 20  CREATININE 1.2  1.2 1.40* 1.16  CALCIUM 8.4  8.6 8.3* 8.4   Liver Function Tests:  Recent Labs Lab 09/18/13 1218 09/19/13 1707 09/20/13 0439  AST 18  15 11 15   ALT 13  13 11 12   ALKPHOS 60  60 64 63  BILITOT 0.4  0.4 0.3 0.2*  PROT 6.6  6.5 5.7* 5.9*  ALBUMIN 3.1*  3.1* 2.8* 2.7*   No results found for this basename: LIPASE, AMYLASE,  in the last 168 hours No results found for this basename:  AMMONIA,  in the last 168 hours CBC:  Recent Labs Lab 09/18/13 1218 09/19/13 1707 09/20/13 0439  WBC 7.8 8.0 10.3  NEUTROABS 5.6 5.7 8.2*  HGB 9.6* 8.7* 8.8*  HCT 28.5* 27.2* 27.6*  MCV 95.1 99.6 98.9  PLT 242.0 215 219   Cardiac Enzymes:  Recent Labs Lab 09/19/13 2111  TROPONINI <0.30   BNP: BNP (last 3 results)  Recent Labs  12/17/12 1227 06/28/13 1440 09/19/13 1707  PROBNP 926.7* 1400.0* 920.2*   CBG:  Recent Labs Lab 09/21/13 1136 09/21/13 1611 09/21/13 2103 09/22/13 0432 09/22/13 0619  GLUCAP 94 158* 99 83 93   Signed:  Juliahna Wiswell L  [facility] 09/22/2013, 8:25 AM

## 2013-09-22 NOTE — Progress Notes (Signed)
Wife called and updated regarding dishcarge today. Asked if sh would like any home services but she declined at this time. Once she arrived to floor discharge teaching was done re: new medications to take (depakote and resuming zoloft) and discontinue keppra and remeron. Prescription given. Further instructions re: diabetes and hypoglycemia while inpatient-new NPH dose 10 units reviewed and advised to f/u with PCP regarding further management. Wife will make follow up appointments as instructed. She will also discuss PPM with Cardiologist Dr. Ladona Ridgelaylor at visit. IV removed, dsg placed, clean dry and intact. Tele monitor removed. Condom cath removed and pateint discharged with mesh pants and chucks per wife request. Patient discharged with belongings via w/c and 3 assist to patients van. Wife states she has son to help at home and w/c.  Karleen HampshireSpencer, Ovidio KinKrista M

## 2013-09-22 NOTE — Progress Notes (Signed)
Asked by the nurse to look at rhythm on telemetry. The patient has sinus rhythm with frequent nonconducted PACs and occasional second degree AV block. He has occasional bradycardia with this.  He has demonstrated demand V pacing at 40 bpm from his ICD.  I have reviewed Joey Deakins note from 09/19/13 (entered under [employer]) which suggest normal device function. The patient has not seen Dr Ladona Ridgelaylor since 2013.  I have stressed the importance of device follow-up/ clinical compliance with the patient today.  He is planned to discharge today per neurology.  I think that further EP discussions can be had with Dr Ladona Ridgelaylor as an outpatient.  Ultimately atrial lead addition could be considered if he develops symptoms of bradycardia or increased V pacing.  Electrophysiology team to see as needed while here. Please call with questions.  Hillis RangeJames Amberrose Friebel MD

## 2013-09-22 NOTE — Care Management Note (Signed)
    Page 1 of 1   09/22/2013     5:02:54 PM CARE MANAGEMENT NOTE 09/22/2013  Patient:  [name],[name]   Account Number:  [id number]  Date Initiated:  09/22/2013  Documentation initiated by:  Siegfried Vieth  Subjective/Objective Assessment:   pt adm on 8/14 with hypotension.  PTA, pt WC bound, lives with spouse.     Action/Plan:   Pt for dc today; no needs identified.   Anticipated DC Date:  09/22/2013   Anticipated DC Plan:  HOME/SELF CARE      DC Planning Services  CM consult      Choice offered to / List presented to:             Status of service:  Completed, signed off Medicare Important Message given?  YES (If response is "NO", the following Medicare IM given date fields will be blank) Date Medicare IM given:  09/22/2013 Medicare IM given by:  Chelby Salata Date Additional Medicare IM given:   Additional Medicare IM given by:    Discharge Disposition:  HOME/SELF CARE  Per UR Regulation:  Reviewed for med. necessity/level of care/duration of stay  If discussed at Long Length of Stay Meetings, dates discussed:    Comments:

## 2013-09-25 ENCOUNTER — Telehealth: Payer: Self-pay | Admitting: *Deleted

## 2013-09-25 NOTE — Telephone Encounter (Signed)
Frank DavenportSandra, [facility] with [facility] stated pt was approvedd with their services on yesterday needing to get his last A1C results. Inform her last check 09/18/13, it was 7.1.../lmb

## 2013-09-30 ENCOUNTER — Ambulatory Visit (INDEPENDENT_AMBULATORY_CARE_PROVIDER_SITE_OTHER): Payer: Commercial Managed Care - HMO | Admitting: Neurology

## 2013-09-30 DIAGNOSIS — R569 Unspecified convulsions: Secondary | ICD-10-CM

## 2013-09-30 NOTE — Procedures (Signed)
ELECTROENCEPHALOGRAM REPORT  Date of Study: 09/30/2013  Patient's Name: [name] MRN: [id number] Date of Birth: 1940-08-18  Referring Provider: Dr. Patrcia Dolly  Clinical History: This is a 73 year old man with 4-5 episodes of staring and unresponsiveness suggestive of partial seizures. EEG for classification.  Medications: Depakote, Coreg, simvastatin, Valsartan  Technical Summary: A multichannel digital EEG recording measured by the international 10-20 system with electrodes applied with paste and impedances below 5000 ohms performed as portable with EKG monitoring in an awake and drowsy patient.  Hyperventilation was not performed, photic stimulation was performed.  The digital EEG was referentially recorded, reformatted, and digitally filtered in a variety of bipolar and referential montages for optimal display.   Description: The patient is predominantly drowsy during the recording.  During brief period of maximal wakefulness, there is a symmetric, medium voltage 7.5 Hz posterior dominant rhythm that attenuates with eye opening. The record is symmetric.  During drowsiness, there is an increase in theta and delta slowing of the background. Photic stimulation did not elicit any abnormalities.  There were no epileptiform discharges or electrographic seizures seen.    EKG lead showed irregular rhythm.  Impression: This brief wake and drowsy EEG is mildly abnormal due to slowing of the posterior dominant rhythm.  Clinical Correlation of the above findings indicates diffuse cerebral dysfunction that is non-specific in etiology and can be seen with hypoxic/ischemic injury, toxic/metabolic encephalopathies, neurodegenerative disorders, medication effect, or excessive drowsiness.  The absence of epileptiform discharges does not rule out a clinical diagnosis of epilepsy.  Clinical correlation is advised.   Patrcia Dolly, M.D.

## 2013-10-08 ENCOUNTER — Telehealth: Payer: Self-pay | Admitting: Internal Medicine

## 2013-10-08 NOTE — Telephone Encounter (Signed)
[name] states pt is having watery stools, 3-4 times per day since 10/06/13.  Was black and now greenish,brown.  No fever, cold at night.  Please call [name] and advise of plan of care.

## 2013-10-08 NOTE — Telephone Encounter (Signed)
Needs OV.  

## 2013-10-09 DIAGNOSIS — I739 Peripheral vascular disease, unspecified: Secondary | ICD-10-CM

## 2013-10-09 DIAGNOSIS — E109 Type 1 diabetes mellitus without complications: Secondary | ICD-10-CM

## 2013-10-09 DIAGNOSIS — G40909 Epilepsy, unspecified, not intractable, without status epilepticus: Secondary | ICD-10-CM

## 2013-10-09 DIAGNOSIS — I69959 Hemiplegia and hemiparesis following unspecified cerebrovascular disease affecting unspecified side: Secondary | ICD-10-CM

## 2013-10-09 NOTE — Telephone Encounter (Signed)
Called pt spoke with wife she stated she don't have a way to bring him today will have to contact [facility] and they can bring her tomorrow. Made appt for tomorrow @ 2:15pm. Notified Tracey with status...Raechel Chute

## 2013-10-10 ENCOUNTER — Encounter: Payer: Self-pay | Admitting: Internal Medicine

## 2013-10-10 ENCOUNTER — Ambulatory Visit (INDEPENDENT_AMBULATORY_CARE_PROVIDER_SITE_OTHER): Payer: Commercial Managed Care - HMO | Admitting: Internal Medicine

## 2013-10-10 VITALS — BP 120/84 | HR 69 | Temp 98.3°F

## 2013-10-10 DIAGNOSIS — IMO0002 Reserved for concepts with insufficient information to code with codable children: Secondary | ICD-10-CM

## 2013-10-10 DIAGNOSIS — R197 Diarrhea, unspecified: Secondary | ICD-10-CM

## 2013-10-10 DIAGNOSIS — R5381 Other malaise: Secondary | ICD-10-CM

## 2013-10-10 DIAGNOSIS — E1065 Type 1 diabetes mellitus with hyperglycemia: Secondary | ICD-10-CM

## 2013-10-10 DIAGNOSIS — Z23 Encounter for immunization: Secondary | ICD-10-CM

## 2013-10-10 DIAGNOSIS — E785 Hyperlipidemia, unspecified: Secondary | ICD-10-CM

## 2013-10-10 DIAGNOSIS — G40909 Epilepsy, unspecified, not intractable, without status epilepticus: Secondary | ICD-10-CM

## 2013-10-10 DIAGNOSIS — I10 Essential (primary) hypertension: Secondary | ICD-10-CM

## 2013-10-10 DIAGNOSIS — R1319 Other dysphagia: Secondary | ICD-10-CM

## 2013-10-10 NOTE — Progress Notes (Signed)
Pre visit review using our clinic review tool, if applicable. No additional management support is needed unless otherwise documented below in the visit note. 

## 2013-10-10 NOTE — Patient Instructions (Addendum)
You had the flu shot today  Please continue all other medications as before, and refills have been done if requested.  Please have the pharmacy call with any other refills you may need.  Please keep your appointments with your specialists as you may have planned  No further lab work needed today  OK to use Immodium as needed  Please return in 3 months, or sooner if needed

## 2013-10-10 NOTE — Progress Notes (Signed)
Subjective:    Patient ID: CAEDIN MOGAN, male    DOB: 19-Sep-1940, 73 y.o.   MRN: [id number]  HPI  Here to f/u post hospn with weakness, staring episode, low BP and meds adjusted/clonidine, keppra, hydralazine, remeron stopped, IVF's required for volume..  Home now over 2 wks, No further staring episodes.  Also insulin decreased from 30 to 10, sugars improved but still with less po intake recent as well, fluids and solids per wife. Will drink ensure. Does seem overall more alert today.  Also having occas watery stool (2 yest, none today so far), no n/v, Denies worsening reflux, dysphagia, or blood.  Wife states some wt loss, several lbs, not clear the timing, not sure the cuase. Stool has been greenish/dark, not black or BRB. Is on lasix 40 qd, wears diapers  But wife states does not ever seem soaked, only changes once daily.   Does not seem dizzy to sit up. No fever. No recent antibx use.  Did have fall x 2 per PT working with him, no apparent injury. Wt Readings from Last 3 Encounters:  09/22/13 198 lb 3.1 oz (89.9 kg)  08/21/13 216 lb (97.977 kg)  12/19/12 204 lb 4.8 oz (92.67 kg)   Past Medical History  Diagnosis Date  . 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 Automatic implantable cardiac defibrillator in situ   . Congestive heart failure, unspecified   . CVA (cerebral vascular accident)   . Retention of urine, unspecified   . Pneumonia, organism unspecified   . Cough   . Acute bronchitis   . Contusion of foot   . Other sign and symptom in breast   . Fibromyalgia   . Hyperlipidemia   . Coronary atherosclerosis of unspecified type of vessel, native or graft   . Esophageal reflux   . Type I (juvenile type) diabetes mellitus without mention of complication, uncontrolled   . Essential hypertension, benign   . Cocaine abuse   . Alcohol abuse   . Microalbuminuria   . GERD (gastroesophageal reflux disease)   . Arteriovenous malformation (AVM)     of colon  .  Diverticulosis   . GI bleed     secondary to avm   Past Surgical History  Procedure Laterality Date  . Back surgery      X 2  . Tonsillectomy    . Cardiac defibrillator placement      reports that he has been smoking Cigarettes.  He has a 110 pack-year smoking history. He has never used smokeless tobacco. He reports that he does not drink alcohol or use illicit drugs. family history includes Colon cancer in his brother; Diabetes in his sister; Heart failure in his mother. There is no history of Prostate cancer. Allergies  Allergen Reactions  . Metformin Diarrhea   Current Outpatient Prescriptions on File Prior to Visit  Medication Sig Dispense Refill  . carvedilol (COREG) 25 MG tablet Take 0.5 tablets (12.5 mg total) by mouth 2 (two) times daily with a meal.  90 tablet  3  . divalproex (DEPAKOTE) 500 MG DR tablet Take 1 tablet (500 mg total) by mouth every 12 (twelve) hours.  60 tablet  0  . finasteride (PROSCAR) 5 MG tablet Take 1 tablet (5 mg total) by mouth daily.  14 tablet  0  . furosemide (LASIX) 40 MG tablet Take 1 tablet (40 mg total) by mouth daily.  90 tablet  3  . insulin NPH Human (NOVOLIN N)  100 UNIT/ML injection Inject 0.1 mLs (10 Units total) into the skin daily. Per Vial with syringe/needle  10 mL  11  . potassium chloride SA (K-DUR,KLOR-CON) 20 MEQ tablet Take 1 tablet (20 mEq total) by mouth 2 (two) times daily.  180 tablet  3  . simvastatin (ZOCOR) 40 MG tablet Take 1 tablet (40 mg total) by mouth at bedtime.  90 tablet  3  . tamsulosin (FLOMAX) 0.4 MG CAPS capsule Take 1 capsule (0.4 mg total) by mouth daily.  90 capsule  3  . valsartan (DIOVAN) 320 MG tablet Take 1 tablet (320 mg total) by mouth daily.  90 tablet  3   No current facility-administered medications on file prior to visit.      Review of Systems  Constitutional: Negative for unusual diaphoresis or other sweats  HENT: Negative for ringing in ear Eyes: Negative for double vision or worsening visual  disturbance.  Respiratory: Negative for choking and stridor.   Gastrointestinal: Negative for vomiting or other signifcant bowel change Genitourinary: Negative for hematuria or decreased urine volume.  Musculoskeletal: Negative for other MSK pain or swelling Skin: Negative for color change and worsening wound.  Neurological: Negative for tremors and numbness other than noted  Psychiatric/Behavioral: Negative for agitation other than above       Objective:   Physical Exam BP 120/84  Pulse 69  Temp(Src) 98.3 F (36.8 C) (Oral)  SpO2 95% VS noted, more alert, speaks a few words this visit , in wheelchair Constitutional: Pt appears well-developed, well-nourished.  HENT: Head: NCAT.  Right Ear: External ear normal.  Left Ear: External ear normal.  Eyes: . Pupils are equal, round, and reactive to light. Conjunctivae and EOM are normal Neck: Normal range of motion. Neck supple.  Cardiovascular: Normal rate and regular rhythm.   Pulmonary/Chest: Effort normal and breath sounds normal.  Abd:  Soft, NT, ND, + BS Neurological: Pt is alert. At baseline confused , motor with right hemiparesis no change, moves all 4's Skin: Skin is warm. No rash, has trace to 1+ edema bilat right > left Psychiatric: Pt behavior is normal. No agitation.     Assessment & Plan:

## 2013-10-11 DIAGNOSIS — R197 Diarrhea, unspecified: Secondary | ICD-10-CM | POA: Insufficient documentation

## 2013-10-11 DIAGNOSIS — R5381 Other malaise: Secondary | ICD-10-CM | POA: Insufficient documentation

## 2013-10-11 NOTE — Assessment & Plan Note (Signed)
Denies today, has some wt loss, somewhat less appetite - for glucerna tid between meals

## 2013-10-11 NOTE — Assessment & Plan Note (Signed)
Improved by hx on current regimen,  to f/u any worsening symptoms or concerns

## 2013-10-11 NOTE — Assessment & Plan Note (Addendum)
Hx and exam benign, will hold on further stool testing, but can exacerbate volume status on lasix as well, for immodium prn  Note:  Total time for pt hx, exam, review of record with pt in the room, determination of diagnoses and plan for further eval and tx is > 40 min, with over 50% spent in coordination and counseling of patient

## 2013-10-11 NOTE — Assessment & Plan Note (Signed)
With recent lower BP now improved,  to f/u any worsening symptoms or concerns BP Readings from Last 3 Encounters:  10/10/13 120/84  09/22/13 158/55  09/18/13 122/80

## 2013-10-11 NOTE — Assessment & Plan Note (Signed)
stable overall by history and exam, recent data reviewed with pt, and pt to continue medical treatment as before,  to f/u any worsening symptoms or concerns Lab Results  Component Value Date   HGBA1C 7.1* 09/18/2013

## 2013-10-11 NOTE — Assessment & Plan Note (Signed)
stable overall by history and exam, recent data reviewed with pt, and pt to continue medical treatment as before,  to f/u any worsening symptoms or concerns Lab Results  Component Value Date   LDLCALC 63 09/18/2013

## 2013-10-11 NOTE — Assessment & Plan Note (Signed)
To cont PT at home to avoid further falls

## 2013-10-21 ENCOUNTER — Telehealth: Payer: Self-pay | Admitting: Internal Medicine

## 2013-10-21 ENCOUNTER — Telehealth: Payer: Self-pay | Admitting: Neurology

## 2013-10-21 NOTE — Telephone Encounter (Signed)
Pt wife called and cancelled appt to see you on 10-22-13 due the patient being sick and resch  For 10-31-13

## 2013-10-21 NOTE — Telephone Encounter (Signed)
Spoke with patient's wife and she states patient has had diarrhea off and on for a month. He saw Dr. Jonny Ruiz and he recommends he see his GI MD. Patient is also losing weight per wife. Scheduled with Dr. Juanda Chance on 10/28/13 at 3:30 PM.

## 2013-10-22 ENCOUNTER — Ambulatory Visit: Payer: Medicare HMO | Admitting: Neurology

## 2013-10-22 ENCOUNTER — Telehealth: Payer: Self-pay

## 2013-10-22 NOTE — Telephone Encounter (Signed)
pateints wife informed ok per PCP to continue as doing.

## 2013-10-22 NOTE — Telephone Encounter (Signed)
Tracey with [facility] called the physician line. She stated that the pt has been complaining of having a cold. Pt's wife has been giving him the Cloricidine HBP (OTC) cold medicine.   Tracey from [facility] is asking if this is okay and wanted to confirm that the OTC would not interact with any of his current medicaitons.   Please call wife back if there are any recommended changes.

## 2013-10-22 NOTE — Telephone Encounter (Signed)
This should be ok, as it is not supposed to affect the BP

## 2013-10-23 ENCOUNTER — Encounter: Payer: Self-pay | Admitting: *Deleted

## 2013-10-24 ENCOUNTER — Other Ambulatory Visit: Payer: Self-pay

## 2013-10-24 MED ORDER — DIVALPROEX SODIUM 500 MG PO DR TAB: 500.0000 mg | DELAYED_RELEASE_TABLET | Freq: Two times a day (BID) | ORAL | Status: DC

## 2013-10-24 NOTE — Telephone Encounter (Signed)
depalote Done erx

## 2013-10-27 NOTE — Telephone Encounter (Signed)
Faxed script bck to [facility]...lmb 

## 2013-10-28 ENCOUNTER — Encounter: Payer: Self-pay | Admitting: Internal Medicine

## 2013-10-28 ENCOUNTER — Encounter: Payer: Commercial Managed Care - HMO | Admitting: Internal Medicine

## 2013-10-28 ENCOUNTER — Telehealth: Payer: Self-pay

## 2013-10-28 ENCOUNTER — Ambulatory Visit (INDEPENDENT_AMBULATORY_CARE_PROVIDER_SITE_OTHER): Payer: Commercial Managed Care - HMO | Admitting: Internal Medicine

## 2013-10-28 VITALS — BP 100/56 | HR 72

## 2013-10-28 DIAGNOSIS — R195 Other fecal abnormalities: Secondary | ICD-10-CM

## 2013-10-28 DIAGNOSIS — R197 Diarrhea, unspecified: Secondary | ICD-10-CM

## 2013-10-28 MED ORDER — METRONIDAZOLE 250 MG PO TABS: 250.0000 mg | ORAL_TABLET | Freq: Three times a day (TID) | ORAL | Status: DC

## 2013-10-28 MED ORDER — DICYCLOMINE HCL 10 MG PO CAPS: 10.0000 mg | ORAL_CAPSULE | Freq: Two times a day (BID) | ORAL | Status: DC

## 2013-10-28 MED ORDER — MOVIPREP 100 G PO SOLR: 1.0000 | Freq: Once | ORAL | Status: DC

## 2013-10-28 NOTE — Telephone Encounter (Signed)
HHRN would like a verbal to have additional therapy for twice weekly for two more weeks.  Call back with verbal response to [phone].

## 2013-10-28 NOTE — Telephone Encounter (Signed)
Ok for verbal 

## 2013-10-28 NOTE — Telephone Encounter (Signed)
Called [facility] informed  Of verbal ok per PCP.

## 2013-10-28 NOTE — Progress Notes (Signed)
[name] [birth year] [id number]  Note: This dictation was prepared with Dragon digital system. Any transcriptional errors that result from this procedure are unintentional.   History of Present Illness:  This is a 73 year old, African American male with severe heart disease followed by Dr. 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 The ability. He still wheelchair-bound. We have seen him in the past for iron deficiency anemia in 1999 and prior to that in 1989. 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 He has a seizure disorder and diabetes. Renal stones and aortic calcification. In the last several months his wife takes care of him reports soft and runny stools some of them incontinent. He wears diapers. His tox screen in August 2015 checked positive for cocaine. His hemoglobin was 8.7 hematocrit 27.2, his appetite has been poor. His wife describes a weight loss from 250 pounds to currently 190 pounds    Past Medical History  Diagnosis Date  . 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 Automatic implantable cardiac defibrillator in situ   . Congestive heart failure, unspecified   . CVA (cerebral vascular accident)   . Retention of urine, unspecified   . Pneumonia, organism unspecified   . Cough   . Acute bronchitis   . Contusion of foot   . Other sign and symptom in breast   . Fibromyalgia   . Hyperlipidemia   . Coronary atherosclerosis of unspecified type of vessel, native or graft   . Esophageal reflux   . Type I (juvenile type) diabetes mellitus without mention of complication, uncontrolled   . Essential hypertension, benign   . Cocaine abuse   . Alcohol abuse   . Microalbuminuria   . GERD (gastroesophageal reflux disease)   . Arteriovenous malformation (AVM)     of colon  . Diverticulosis   . GI bleed     secondary to avm  . Umbilical hernia     Past  Surgical History  Procedure Laterality Date  . Back surgery      X 2  . Tonsillectomy    . Cardiac defibrillator placement      Allergies  Allergen Reactions  . Metformin Diarrhea    Family history and social history have been reviewed.  Review of Systems: Patient does not volunteer any complaints. Y. describes all loose stools. Decreased appetite  The remainder of the 10 point ROS is negative except as outlined in the H&P  Physical Exam: General Appearance Well developed, in no distress, in a wheelchair. Does not answer questions Eyes  Non icteric  HEENT  Non traumatic, normocephalic  Mouth No lesion, tongue papillated, no cheilosis Neck Supple without adenopathy, thyroid not enlarged, no carotid bruits, no JVD Lungs Clear to auscultation bilaterally quiet breath sounds no wheezes COR Normal S1, normal S2, regular rhythm, no murmur, quiet precordium Abdomen protuberant and soft with normoactive bowel sounds Rectal soft runny Hemoccult-positive stool Extremities  1+ pedal edema Skin No lesions Neurological Alert and oriented x 3, right hemiparesis Psychological Normal mood and affect  Assessment and Plan:   73 year old [place] American male who is debilitated.  He also is Hemoccult positive on my exam today. Possible causes of diarrhea include bacterial overgrowth. Medication such as specifically Coreg has at 12% incidence of diarrhea. Diabetic autonomic neuropathy is another possibility. Overflow diarrhea is unlikely , noimpaction on today's exam. We will proceed with colonoscopy. We will  also give trial of Flagyl 250 mg 3 times a day for one week and reduce Coreg to 12.5 mg once a  today. He will still remain on Diovanand Lasix. He has upcoming appointment with Dr. Ladona Ridgel. Today his blood pressure is 100/56 so  it would be okay to cut back on Coreg until he sees Dr. Ladona Ridgel. His wife will give him Imodium 2 mg daily    Lina Sar 10/28/2013

## 2013-10-28 NOTE — Patient Instructions (Signed)
You have been scheduled for a colonoscopy. Please follow written instructions given to you at your visit today.  Please pick up your prep kit at the pharmacy within the next 1-3 days. If you use inhalers (even only as needed), please bring them with you on the day of your procedure. Your physician has requested that you go to www.startemmi.com and enter the access code given to you at your visit today. This web site gives a general overview about your procedure. However, you should still follow specific instructions given to you by our office regarding your preparation for the procedure.  We have sent the following medications to your pharmacy for you to pick up at your convenience: Flagyl Bentyl  Please decrease your Coreg to 12.5 mg once daily.  CC:Dr Cathlean Cower

## 2013-10-31 ENCOUNTER — Telehealth: Payer: Self-pay | Admitting: Neurology

## 2013-10-31 ENCOUNTER — Ambulatory Visit: Payer: Medicare HMO | Admitting: Neurology

## 2013-10-31 NOTE — Telephone Encounter (Signed)
Pt not feeling well. Unable to come to today's follow up. Pt will call later to r/s / Sherri S.

## 2013-11-27 ENCOUNTER — Encounter: Payer: Commercial Managed Care - HMO | Admitting: Internal Medicine

## 2013-12-10 ENCOUNTER — Encounter (HOSPITAL_COMMUNITY): Payer: Self-pay | Admitting: *Deleted

## 2013-12-21 NOTE — H&P (Signed)
History of Present Illness:  This is a 73 year old, African American male with severe heart disease followed by Dr. 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 The ability. He still wheelchair-bound. We have seen him in the past for iron deficiency anemia in 1999 and prior to that in 1989. 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 He has a seizure disorder and diabetes. Renal stones and aortic calcification. In the last several months his wife takes care of him reports soft and runny stools some of them incontinent. He wears diapers. His tox screen in August 2015 checked positive for cocaine. His hemoglobin was 8.7 hematocrit 27.2, his appetite has been poor. His wife describes a weight loss from 250 pounds to currently 190 pounds    Past Medical History  Diagnosis Date  . 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 Automatic implantable cardiac defibrillator in situ   . Congestive heart failure, unspecified   . CVA (cerebral vascular accident)   . Retention of urine, unspecified   . Pneumonia, organism unspecified   . Cough   . Acute bronchitis   . Contusion of foot   . Other sign and symptom in breast   . Fibromyalgia   . Hyperlipidemia   . Coronary atherosclerosis of unspecified type of vessel, native or graft   . Esophageal reflux   . Type I (juvenile type) diabetes mellitus without mention of complication, uncontrolled   . Essential hypertension, benign   . Cocaine abuse   . Alcohol abuse   . Microalbuminuria   . GERD (gastroesophageal reflux disease)   . Arteriovenous malformation (AVM)     of colon  . Diverticulosis   . GI bleed     secondary to avm  . Umbilical hernia     Past Surgical History  Procedure Laterality Date  . Back surgery      X 2  .  Tonsillectomy    . Cardiac defibrillator placement      Allergies  Allergen Reactions  . Metformin Diarrhea    Family history and social history have been reviewed.  Review of Systems: Patient does not volunteer any complaints. Y. describes all loose stools. Decreased appetite  The remainder of the 10 point ROS is negative except as outlined in the H&P  Physical Exam: General Appearance Well developed, in no distress, in a wheelchair. Does not answer questions Eyes Non icteric  HEENT Non traumatic, normocephalic  Mouth No lesion, tongue papillated, no cheilosis Neck Supple without adenopathy, thyroid not enlarged, no carotid bruits, no JVD Lungs Clear to auscultation bilaterally quiet breath sounds no wheezes COR Normal S1, normal S2, regular rhythm, no murmur, quiet precordium Abdomen protuberant and soft with normoactive bowel sounds Rectal soft runny Hemoccult-positive stool Extremities 1+ pedal edema Skin No lesions Neurological Alert and oriented x 3, right hemiparesis Psychological Normal mood and affect  Assessment and Plan:   73 year old [place] male who is debilitated. He also is Hemoccult positive on my exam today. Possible causes of diarrhea include bacterial overgrowth. Medication such as specifically Coreg has at 12% incidence of diarrhea. Diabetic autonomic neuropathy is another possibility. Overflow diarrhea is unlikely , noimpaction on today's exam. We will proceed with colonoscopy. We will also give trial of Flagyl 250 mg 3 times a day for one week and reduce Coreg to 12.5 mg once a today. He will still remain on Diovanand Lasix.  He has upcoming appointment with Dr. Ladona Ridgelaylor. Today his blood pressure is 100/56 so it would be okay to cut back on Coreg until he sees Dr. Ladona Ridgelaylor. His wife will give him Imodium 2 mg daily    Lina SarDora Brodie 10/28/2013

## 2013-12-22 NOTE — Interval H&P Note (Signed)
History and Physical Interval Note:  12/22/2013 10:29 PM  [name]  has presented today for surgery, with the diagnosis of heme positive stool diarrhea  The various methods of treatment have been discussed with the patient and family. After consideration of risks, benefits and other options for treatment, the patient has consented to  Procedure(s): COLONOSCOPY WITH PROPOFOL (N/A) as a surgical intervention .  The patient's history has been reviewed, patient examined, no change in status, stable for surgery.  I have reviewed the patient's chart and labs.  Questions were answered to the patient's satisfaction.     Lina Sarora Martin Belling

## 2013-12-23 ENCOUNTER — Ambulatory Visit (HOSPITAL_COMMUNITY): Payer: Medicare HMO | Admitting: Certified Registered Nurse Anesthetist

## 2013-12-23 ENCOUNTER — Encounter (HOSPITAL_COMMUNITY): Payer: Self-pay | Admitting: Certified Registered Nurse Anesthetist

## 2013-12-23 ENCOUNTER — Ambulatory Visit (HOSPITAL_COMMUNITY)
Admission: RE | Admit: 2013-12-23 | Discharge: 2013-12-23 | Disposition: A | Discharge status: Home / Self Care | Payer: Medicare HMO | Source: Ambulatory Visit | Attending: Internal Medicine | Admitting: Internal Medicine

## 2013-12-23 ENCOUNTER — Encounter (HOSPITAL_COMMUNITY)
Admission: RE | Disposition: A | Discharge status: Home / Self Care | Payer: Self-pay | Source: Ambulatory Visit | Attending: Internal Medicine

## 2013-12-23 DIAGNOSIS — G40909 Epilepsy, unspecified, not intractable, without status epilepticus: Secondary | ICD-10-CM | POA: Insufficient documentation

## 2013-12-23 DIAGNOSIS — R197 Diarrhea, unspecified: Secondary | ICD-10-CM

## 2013-12-23 DIAGNOSIS — D509 Iron deficiency anemia, unspecified: Secondary | ICD-10-CM | POA: Insufficient documentation

## 2013-12-23 DIAGNOSIS — I69951 Hemiplegia and hemiparesis following unspecified cerebrovascular disease affecting right dominant side: Secondary | ICD-10-CM | POA: Diagnosis not present

## 2013-12-23 DIAGNOSIS — Z95810 Presence of automatic (implantable) cardiac defibrillator: Secondary | ICD-10-CM | POA: Diagnosis not present

## 2013-12-23 DIAGNOSIS — R195 Other fecal abnormalities: Secondary | ICD-10-CM

## 2013-12-23 DIAGNOSIS — F1010 Alcohol abuse, uncomplicated: Secondary | ICD-10-CM | POA: Insufficient documentation

## 2013-12-23 DIAGNOSIS — E785 Hyperlipidemia, unspecified: Secondary | ICD-10-CM | POA: Insufficient documentation

## 2013-12-23 DIAGNOSIS — F1410 Cocaine abuse, uncomplicated: Secondary | ICD-10-CM | POA: Diagnosis not present

## 2013-12-23 DIAGNOSIS — K5730 Diverticulosis of large intestine without perforation or abscess without bleeding: Secondary | ICD-10-CM | POA: Diagnosis not present

## 2013-12-23 DIAGNOSIS — I509 Heart failure, unspecified: Secondary | ICD-10-CM | POA: Insufficient documentation

## 2013-12-23 DIAGNOSIS — I10 Essential (primary) hypertension: Secondary | ICD-10-CM | POA: Diagnosis not present

## 2013-12-23 DIAGNOSIS — E1021 Type 1 diabetes mellitus with diabetic nephropathy: Secondary | ICD-10-CM | POA: Diagnosis not present

## 2013-12-23 DIAGNOSIS — K219 Gastro-esophageal reflux disease without esophagitis: Secondary | ICD-10-CM | POA: Insufficient documentation

## 2013-12-23 HISTORY — DX: Dependence on supplemental oxygen: Z99.81

## 2013-12-23 HISTORY — PX: COLONOSCOPY WITH PROPOFOL: SHX5780

## 2013-12-23 LAB — GLUCOSE, CAPILLARY: GLUCOSE-CAPILLARY: 78 mg/dL (ref 70–99)

## 2013-12-23 SURGERY — COLONOSCOPY WITH PROPOFOL
Anesthesia: Monitor Anesthesia Care

## 2013-12-23 MED ORDER — PROPOFOL INFUSION 10 MG/ML OPTIME
INTRAVENOUS | Status: DC | PRN
  Administered 2013-12-23: 75 ug/kg/min via INTRAVENOUS

## 2013-12-23 MED ORDER — PROPOFOL 10 MG/ML IV BOLUS
INTRAVENOUS | Status: AC
  Filled 2013-12-23: qty 20

## 2013-12-23 MED ORDER — LIDOCAINE HCL (CARDIAC) 20 MG/ML IV SOLN
INTRAVENOUS | Status: AC
  Filled 2013-12-23: qty 5

## 2013-12-23 MED ORDER — LACTATED RINGERS IV SOLN
INTRAVENOUS | Status: DC
  Administered 2013-12-23: 10:00:00 via INTRAVENOUS

## 2013-12-23 MED ORDER — PROPOFOL 10 MG/ML IV BOLUS
INTRAVENOUS | Status: DC | PRN
  Administered 2013-12-23: 20 mg via INTRAVENOUS
  Administered 2013-12-23: 10 mg via INTRAVENOUS

## 2013-12-23 MED ORDER — HYDRALAZINE HCL 20 MG/ML IJ SOLN
INTRAMUSCULAR | Status: AC
  Filled 2013-12-23: qty 1

## 2013-12-23 MED ORDER — HYDRALAZINE HCL 20 MG/ML IJ SOLN
10.0000 mg | Freq: Once | INTRAMUSCULAR | Status: AC
  Administered 2013-12-23: 10 mg via INTRAVENOUS

## 2013-12-23 MED ORDER — SODIUM CHLORIDE 0.9 % IV SOLN: INTRAVENOUS | Status: DC

## 2013-12-23 MED ORDER — LIDOCAINE HCL (CARDIAC) 20 MG/ML IV SOLN
INTRAVENOUS | Status: DC | PRN
  Administered 2013-12-23: 80 mg via INTRAVENOUS

## 2013-12-23 SURGICAL SUPPLY — 21 items

## 2013-12-23 NOTE — Op Note (Signed)
[facility] [address] [place], [id number]   COLONOSCOPY PROCEDURE REPORT  PATIENT: [name]  MR#: [id number] BIRTHDATE: 10-18-1940 , 73  yrs. old GENDER: male ENDOSCOPIST: Hart Carwinora M Keimya Briddell, MD REFERRED XB:JYNWGBY:Reynol John, M.D. PROCEDURE DATE:  12/23/2013 PROCEDURE:   Colonoscopy, screening First Screening Colonoscopy - Avg.  risk and is 50 yrs.  old or older - No.  Prior Negative Screening - Now for repeat screening. 10 or more years since last screening  History of Adenoma - Now for follow-up colonoscopy & has been > or = to 3 yrs.  N/A  Polyps Removed Today? No.  Polyps Removed Today? No.  Recommend repeat exam, <10 yrs? Polyps Removed Today? No.  Recommend repeat exam, <10 yrs? No. ASA CLASS:   Class IV INDICATIONS:prior colonoscopy in 1989 and in 1999.  History of AV malformation of the descending colon.  Recent onset diarrhea. Hemoglobin 8.7.  Heme positive stool on recent physical exam. MEDICATIONS: Monitored anesthesia care  DESCRIPTION OF PROCEDURE:   After the risks benefits and alternatives of the procedure were thoroughly explained, informed consent was obtained.  The digital rectal exam revealed decreased sphincter tone.   The Pentax Ped Colon [id number]  endoscope was introduced through the anus and advanced to the cecum, which was identified by both the appendix and ileocecal valve. No adverse events experienced.   The quality of the prep was excellent, using MoviPrep  The instrument was then slowly withdrawn as the colon was fully examined.      COLON FINDINGS: There was mild diverticulosis noted throughout the entire examined colon.  Retroflexed views revealed no abnormalities. The time to cecum=14 minutes 00 seconds.  Withdrawal time=8 minutes 00 seconds.  The scope was withdrawn and the procedure completed. COMPLICATIONS: There were no immediate complications.  ENDOSCOPIC IMPRESSION: Mild diverticulosis was noted throughout the entire  examined colon random biopsies of the left colon to rule out microscopic colitis No evidence of bleeding lesion. Heme positive stool likely due to ano- rectal source  RECOMMENDATIONS: Await results of the biopsies Imodium when necessary diarrhea Flagyl 250 tid has been completed Probiotics prn bact.  overgrowth due to diabetic neuropathy  eSigned:  Hart Carwinora M Usama Harkless, MD 12/23/2013 11:04 AM   cc:

## 2013-12-23 NOTE — Transfer of Care (Signed)
Immediate Anesthesia Transfer of Care Note  Patient: [name]  Procedure(s) Performed: Procedure(s) (LRB): COLONOSCOPY WITH PROPOFOL (N/A)  Patient Location: endoscopy  Anesthesia Type: MAC  Level of Consciousness: sedated, patient cooperative and responds to stimulation  Airway & Oxygen Therapy: Patient Spontanous Breathing and Patient connected to face mask oxgen  Post-op Assessment: Report given to endoscopy RN and Post -op Vital signs reviewed and stable  Post vital signs: Reviewed and stable  Complications: No apparent anesthesia complications

## 2013-12-23 NOTE — Anesthesia Postprocedure Evaluation (Signed)
  Anesthesia Post-op Note  Patient: Frank ChromanJames L Ellender  Procedure(s) Performed: Procedure(s) (LRB): COLONOSCOPY WITH PROPOFOL (N/A)  Patient Location: PACU  Anesthesia Type: MAC  Level of Consciousness: awake and alert   Airway and Oxygen Therapy: Patient Spontanous Breathing  Post-op Pain: mild  Post-op Assessment: Post-op Vital signs reviewed, Patient's Cardiovascular Status Stable, Respiratory Function Stable, Patent Airway and No signs of Nausea or vomiting  Last Vitals:  Filed Vitals:   12/23/13 1224  BP: 237/59  Pulse:   Resp:     Post-op Vital Signs: stable   Complications: No apparent anesthesia complications

## 2013-12-23 NOTE — Interval H&P Note (Signed)
History and Physical Interval Note:  12/23/2013 9:55 AM  Lauris ChromanJames L Tellefsen  has presented today for surgery, with the diagnosis of heme positive stool diarrhea  The various methods of treatment have been discussed with the patient and family. After consideration of risks, benefits and other options for treatment, the patient has consented to  Procedure(s): COLONOSCOPY WITH PROPOFOL (N/A) as a surgical intervention .  The patient's history has been reviewed, patient examined, no change in status, stable for surgery.  I have reviewed the patient's chart and labs.  Questions were answered to the patient's satisfaction.     Lina Sarora Hellen Shanley

## 2013-12-23 NOTE — Progress Notes (Signed)
Patient blood pressure has been elevated since post procedure spoke with MD Juanda ChanceBrodie, gave Hydralazine x 2 after last IV dose MD told to dc home.  Last bp 167/104     Hr 60

## 2013-12-23 NOTE — Anesthesia Preprocedure Evaluation (Signed)
Anesthesia Evaluation  Patient identified by MRN, date of birth, ID band Patient awake    Reviewed: Allergy & Precautions, H&P , NPO status , Patient's Chart, lab work & pertinent test results  Airway Mallampati: II  TM Distance: >3 FB Neck ROM: full    Dental  (+) Edentulous Upper, Dental Advisory Given   Pulmonary COPD oxygen dependent, Current Smoker,  breath sounds clear to auscultation  Pulmonary exam normal       Cardiovascular Exercise Tolerance: Poor hypertension, + CAD, + Peripheral Vascular Disease and +CHF + Cardiac Defibrillator Rhythm:regular Rate:Normal     Neuro/Psych Seizures -,  Severe right side weakness CVA, Residual Symptoms negative psych ROS   GI/Hepatic negative GI ROS, Neg liver ROS, GERD-  Medicated,  Endo/Other  diabetes, Poorly Controlled, Type 1, Insulin Dependent  Renal/GU negative Renal ROS  negative genitourinary   Musculoskeletal   Abdominal   Peds  Hematology negative hematology ROS (+)   Anesthesia Other Findings   Reproductive/Obstetrics negative OB ROS                             Anesthesia Physical Anesthesia Plan  ASA: IV  Anesthesia Plan: MAC   Post-op Pain Management:    Induction:   Airway Management Planned:   Additional Equipment:   Intra-op Plan:   Post-operative Plan:   Informed Consent: I have reviewed the patients History and Physical, chart, labs and discussed the procedure including the risks, benefits and alternatives for the proposed anesthesia with the patient or authorized representative who has indicated his/her understanding and acceptance.   Dental Advisory Given  Plan Discussed with: CRNA and Surgeon  Anesthesia Plan Comments:         Anesthesia Quick Evaluation

## 2013-12-24 ENCOUNTER — Encounter: Payer: Self-pay | Admitting: Internal Medicine

## 2013-12-24 ENCOUNTER — Encounter (HOSPITAL_COMMUNITY): Payer: Self-pay | Admitting: Internal Medicine

## 2013-12-30 ENCOUNTER — Encounter: Payer: Commercial Managed Care - HMO | Admitting: Internal Medicine

## 2014-01-14 ENCOUNTER — Ambulatory Visit (INDEPENDENT_AMBULATORY_CARE_PROVIDER_SITE_OTHER): Payer: Commercial Managed Care - HMO | Admitting: Internal Medicine

## 2014-01-14 ENCOUNTER — Encounter: Payer: Self-pay | Admitting: Internal Medicine

## 2014-01-14 VITALS — BP 98/64 | HR 68 | Ht 68.0 in | Wt 198.2 lb

## 2014-01-14 DIAGNOSIS — I10 Essential (primary) hypertension: Secondary | ICD-10-CM

## 2014-01-14 DIAGNOSIS — I5022 Chronic systolic (congestive) heart failure: Secondary | ICD-10-CM

## 2014-01-14 DIAGNOSIS — Z95810 Presence of automatic (implantable) cardiac defibrillator: Secondary | ICD-10-CM

## 2014-01-14 LAB — MDC_IDC_ENUM_SESS_TYPE_INCLINIC
Brady Statistic RV Percent Paced: 5 %
HighPow Impedance: 34 Ohm
HighPow Impedance: 52 Ohm
Lead Channel Impedance Value: 733 Ohm
Lead Channel Pacing Threshold Pulse Width: 0.4 ms
Lead Channel Sensing Intrinsic Amplitude: 20.6 mV
Lead Channel Setting Pacing Amplitude: 2.5 V
Lead Channel Setting Pacing Pulse Width: 0.4 ms
MDC IDC MSMT LEADCHNL RV PACING THRESHOLD AMPLITUDE: 0.6 V
MDC IDC PG SERIAL: 124263
MDC IDC SESS DTM: 20151209050000
MDC IDC SET LEADCHNL RV SENSING SENSITIVITY: 0.4 mV
MDC IDC SET ZONE DETECTION INTERVAL: 333 ms
Zone Setting Detection Interval: 286 ms

## 2014-01-14 NOTE — Patient Instructions (Addendum)
Your physician wants you to follow-up in: 12 months with Dr. Court Joyaylor You will receive a reminder letter in the mail two months in advance. If you don't receive a letter, please call our office to schedule the follow-up appointment.    Remote monitoring is used to monitor your Pacemaker or ICD from home. This monitoring reduces the number of office visits required to check your device to one time per year. It allows us to keep an eye on the functioning of your device to ensure it is working properly. You are scheduled for a device check from home on 04/15/14. You may send your transmission at any time that day. If you have a wireless device, the transmission will be sent automatically. After your physician reviews your transmission, you will receive a postcard with your next transmission date.   Your physician has recommended you make the following change in your medication:  1) Decrease Valsartan to 160mg  daily

## 2014-01-14 NOTE — Assessment & Plan Note (Signed)
His chronic systolic heart failure is well compensated. He will continue a low-sodium diet. I've asked the patient to reduce his dose of valsartan down to 160 mg daily.

## 2014-01-14 NOTE — Progress Notes (Signed)
HPI [name] returns today after a long absence from our EP clinic. He is a 73 year old man with an ischemic cardiomyopathy, chronic systolic heart failure, remote ventricular fibrillation, status post ICD implantation. He sustained a stroke approximately 5 years ago of unexplained etiology, and has a dense right hemiparesis. In the interim, the patient has been stable. He notes some mild dizziness and some shortness of breath. He has not had syncope. His blood pressure used to be quite high but lately has been low and he notes some fatigue and weakness. Allergies  Allergen Reactions  . Metformin Diarrhea     Current Outpatient Prescriptions  Medication Sig Dispense Refill  . carvedilol (COREG) 25 MG tablet Take 12.5 mg by mouth every evening.    . divalproex (DEPAKOTE ER) 250 MG 24 hr tablet Take 250 mg by mouth every evening.    . finasteride (PROSCAR) 5 MG tablet Take 5 mg by mouth daily with breakfast.    . furosemide (LASIX) 40 MG tablet Take 40 mg by mouth daily with breakfast.    . insulin NPH Human (HUMULIN N,NOVOLIN N) 100 UNIT/ML injection Inject 10 Units into the skin daily before breakfast.    . potassium chloride SA (K-DUR,KLOR-CON) 20 MEQ tablet Take 20 mEq by mouth 2 (two) times daily.    . sertraline (ZOLOFT) 50 MG tablet Take 50 mg by mouth daily with breakfast.    . simvastatin (ZOCOR) 40 MG tablet Take 40 mg by mouth every evening.    . tamsulosin (FLOMAX) 0.4 MG CAPS capsule Take 0.4 mg by mouth daily after supper.    . valsartan (DIOVAN) 320 MG tablet Take 160 mg by mouth daily with breakfast.     No current facility-administered medications for this visit.     Past Medical History  Diagnosis Date  . 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 Automatic implantable cardiac defibrillator in situ   . Congestive heart failure, unspecified   . Retention of urine, unspecified   . Pneumonia, organism unspecified   . Cough   . Acute bronchitis    . Contusion of foot   . Other sign and symptom in breast   . Fibromyalgia   . Hyperlipidemia   . Coronary atherosclerosis of unspecified type of vessel, native or graft   . Esophageal reflux   . Type I (juvenile type) diabetes mellitus without mention of complication, uncontrolled   . Essential hypertension, benign   . Cocaine abuse   . Alcohol abuse   . Microalbuminuria   . GERD (gastroesophageal reflux disease)   . Arteriovenous malformation (AVM)     of colon  . Diverticulosis   . GI bleed     secondary to avm  . Umbilical hernia   . 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 History of oxygen administration     at bedtime uses oxygen 2l/m nasally.    ROS:   All systems reviewed and negative except as noted in the HPI.   Past Surgical History  Procedure Laterality Date  . Back surgery      X 2  . Tonsillectomy    . Cardiac defibrillator placement    . Colonoscopy with propofol N/A 12/23/2013    Procedure: COLONOSCOPY WITH PROPOFOL;  Surgeon: Hart Carwinora M Brodie, MD;  Location: [facility];  Service: Endoscopy;  Laterality: N/A;     Family History  Problem Relation Age of Onset  . Heart failure Mother   . Diabetes Sister   . Colon cancer Brother     questionable  . Prostate cancer Neg Hx      History   Social History  . Marital Status: Married    Spouse Name: N/A    Number of Children: 4  . Years of Education: N/A   Occupational History  . 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     Social History Main Topics  . Smoking status: Current Some Day Smoker -- 2.00 packs/day for 55 years    Types: Cigarettes  . Smokeless tobacco: Never Used     Comment: down to 6 cigarettes a day  . Alcohol Use: No     Comment: quit at the time of stroke  . Drug Use: No     Comment: quit at the time of his stroke  . Sexual Activity: Not Currently   Other  Topics Concern  . Not on file   Social History Narrative   7th grade education. Married '67. 3 sons - '67, 72, '74; 1 daughter '69. Worked for [employer]. Retired on disability due to CVA with right hemiparesis '10              BP 98/64 mmHg  Pulse 68  Ht 5\' 8"  (1.727 m)  Wt 198 lb 3.2 oz (89.903 kg)  BMI 30.14 kg/m2  Physical Exam:  Chronically ill appearing 73 year old man, NAD HEENT: Unremarkable Neck:  No JVD, no thyromegally Lymphatics:  No adenopathy Back:  No CVA tenderness Lungs:  Clear with scattered basilar rales. HEART:  Regular rate rhythm, no murmurs, no rubs, no clicks Abd:  soft, positive bowel sounds, no organomegally, no rebound, no guarding Ext:  2 plus pulses, trace peripheral edema, no cyanosis, no clubbing Skin:  No rashes no nodules Neuro:  CN II through XII intact, motor grossly intact, except for right hemiparesis   DEVICE  Normal device function.  See PaceArt for details.   Assess/Plan:

## 2014-01-14 NOTE — Assessment & Plan Note (Signed)
His blood pressure is on the low side. He will reduce his dose of valsartan.

## 2014-01-14 NOTE — Assessment & Plan Note (Signed)
His [employer] ICD is working normally. He has had no intercurrent ICD therapies.

## 2014-01-15 ENCOUNTER — Encounter (HOSPITAL_COMMUNITY): Payer: Self-pay | Admitting: Internal Medicine

## 2014-01-19 ENCOUNTER — Emergency Department (HOSPITAL_COMMUNITY): Payer: Commercial Managed Care - HMO

## 2014-01-19 ENCOUNTER — Emergency Department (HOSPITAL_COMMUNITY)
Admission: EM | Admit: 2014-01-19 | Discharge: 2014-01-20 | Disposition: A | Discharge status: Home / Self Care | Payer: Commercial Managed Care - HMO | Attending: Emergency Medicine | Admitting: Emergency Medicine

## 2014-01-19 ENCOUNTER — Encounter (HOSPITAL_COMMUNITY): Payer: Self-pay | Admitting: Emergency Medicine

## 2014-01-19 ENCOUNTER — Encounter: Payer: Self-pay | Admitting: Internal Medicine

## 2014-01-19 DIAGNOSIS — E785 Hyperlipidemia, unspecified: Secondary | ICD-10-CM | POA: Insufficient documentation

## 2014-01-19 DIAGNOSIS — Z9981 Dependence on supplemental oxygen: Secondary | ICD-10-CM | POA: Insufficient documentation

## 2014-01-19 DIAGNOSIS — I10 Essential (primary) hypertension: Secondary | ICD-10-CM | POA: Insufficient documentation

## 2014-01-19 DIAGNOSIS — Z8719 Personal history of other diseases of the digestive system: Secondary | ICD-10-CM | POA: Diagnosis not present

## 2014-01-19 DIAGNOSIS — Z8774 Personal history of (corrected) congenital malformations of heart and circulatory system: Secondary | ICD-10-CM | POA: Insufficient documentation

## 2014-01-19 DIAGNOSIS — Z8739 Personal history of other diseases of the musculoskeletal system and connective tissue: Secondary | ICD-10-CM | POA: Diagnosis not present

## 2014-01-19 DIAGNOSIS — G40909 Epilepsy, unspecified, not intractable, without status epilepticus: Secondary | ICD-10-CM | POA: Insufficient documentation

## 2014-01-19 DIAGNOSIS — Z95810 Presence of automatic (implantable) cardiac defibrillator: Secondary | ICD-10-CM | POA: Diagnosis not present

## 2014-01-19 DIAGNOSIS — Z87828 Personal history of other (healed) physical injury and trauma: Secondary | ICD-10-CM | POA: Diagnosis not present

## 2014-01-19 DIAGNOSIS — I69854 Hemiplegia and hemiparesis following other cerebrovascular disease affecting left non-dominant side: Secondary | ICD-10-CM | POA: Insufficient documentation

## 2014-01-19 DIAGNOSIS — Z8701 Personal history of pneumonia (recurrent): Secondary | ICD-10-CM | POA: Diagnosis not present

## 2014-01-19 DIAGNOSIS — I509 Heart failure, unspecified: Secondary | ICD-10-CM | POA: Diagnosis not present

## 2014-01-19 DIAGNOSIS — Z720 Tobacco use: Secondary | ICD-10-CM | POA: Insufficient documentation

## 2014-01-19 DIAGNOSIS — Z794 Long term (current) use of insulin: Secondary | ICD-10-CM | POA: Diagnosis not present

## 2014-01-19 DIAGNOSIS — E109 Type 1 diabetes mellitus without complications: Secondary | ICD-10-CM | POA: Diagnosis not present

## 2014-01-19 DIAGNOSIS — R001 Bradycardia, unspecified: Secondary | ICD-10-CM | POA: Insufficient documentation

## 2014-01-19 DIAGNOSIS — R569 Unspecified convulsions: Secondary | ICD-10-CM

## 2014-01-19 DIAGNOSIS — Z79899 Other long term (current) drug therapy: Secondary | ICD-10-CM | POA: Diagnosis not present

## 2014-01-19 DIAGNOSIS — R55 Syncope and collapse: Secondary | ICD-10-CM | POA: Insufficient documentation

## 2014-01-19 DIAGNOSIS — Z87448 Personal history of other diseases of urinary system: Secondary | ICD-10-CM | POA: Diagnosis not present

## 2014-01-19 DIAGNOSIS — Z8709 Personal history of other diseases of the respiratory system: Secondary | ICD-10-CM | POA: Diagnosis not present

## 2014-01-19 DIAGNOSIS — I2510 Atherosclerotic heart disease of native coronary artery without angina pectoris: Secondary | ICD-10-CM | POA: Diagnosis not present

## 2014-01-19 LAB — CBC WITH DIFFERENTIAL/PLATELET
Basophils Absolute: 0 10*3/uL (ref 0.0–0.1)
Basophils Relative: 0 % (ref 0–1)
EOS PCT: 1 % (ref 0–5)
Eosinophils Absolute: 0.1 10*3/uL (ref 0.0–0.7)
HEMATOCRIT: 32 % — AB (ref 39.0–52.0)
HEMOGLOBIN: 9.8 g/dL — AB (ref 13.0–17.0)
LYMPHS PCT: 19 % (ref 12–46)
Lymphs Abs: 1.5 10*3/uL (ref 0.7–4.0)
MCH: 31 pg (ref 26.0–34.0)
MCHC: 30.6 g/dL (ref 30.0–36.0)
MCV: 101.3 fL — ABNORMAL HIGH (ref 78.0–100.0)
MONO ABS: 0.8 10*3/uL (ref 0.1–1.0)
MONOS PCT: 10 % (ref 3–12)
Neutro Abs: 5.5 10*3/uL (ref 1.7–7.7)
Neutrophils Relative %: 70 % (ref 43–77)
Platelets: 213 10*3/uL (ref 150–400)
RBC: 3.16 MIL/uL — ABNORMAL LOW (ref 4.22–5.81)
RDW: 12.7 % (ref 11.5–15.5)
WBC: 7.9 10*3/uL (ref 4.0–10.5)

## 2014-01-19 LAB — COMPREHENSIVE METABOLIC PANEL
ALBUMIN: 3.1 g/dL — AB (ref 3.5–5.2)
ALT: 6 U/L (ref 0–53)
AST: 10 U/L (ref 0–37)
Alkaline Phosphatase: 57 U/L (ref 39–117)
Anion gap: 12 (ref 5–15)
BILIRUBIN TOTAL: 0.3 mg/dL (ref 0.3–1.2)
BUN: 20 mg/dL (ref 6–23)
CHLORIDE: 104 meq/L (ref 96–112)
CO2: 27 mEq/L (ref 19–32)
Calcium: 8.7 mg/dL (ref 8.4–10.5)
Creatinine, Ser: 1.11 mg/dL (ref 0.50–1.35)
GFR calc Af Amer: 74 mL/min — ABNORMAL LOW (ref >90)
GFR calc non Af Amer: 64 mL/min — ABNORMAL LOW (ref >90)
Glucose, Bld: 104 mg/dL — ABNORMAL HIGH (ref 70–99)
Potassium: 4.5 mEq/L (ref 3.7–5.3)
Sodium: 143 mEq/L (ref 137–147)
Total Protein: 7.1 g/dL (ref 6.0–8.3)

## 2014-01-19 LAB — URINE MICROSCOPIC-ADD ON

## 2014-01-19 LAB — URINALYSIS, ROUTINE W REFLEX MICROSCOPIC
GLUCOSE, UA: NEGATIVE mg/dL
HGB URINE DIPSTICK: NEGATIVE
KETONES UR: 15 mg/dL — AB
Nitrite: NEGATIVE
PH: 5 (ref 5.0–8.0)
Protein, ur: 30 mg/dL — AB
Specific Gravity, Urine: 1.021 (ref 1.005–1.030)
Urobilinogen, UA: 1 mg/dL (ref 0.0–1.0)

## 2014-01-19 LAB — I-STAT TROPONIN, ED: Troponin i, poc: 0.01 ng/mL (ref 0.00–0.08)

## 2014-01-19 NOTE — ED Notes (Signed)
Pt returned from scans, monitored by pulse ox, bp cuff, and 12-lead.  Attempted to get urine from pt. Pt requests 30 minutes to try again.

## 2014-01-19 NOTE — ED Notes (Signed)
Seizure pads placed on rails

## 2014-01-19 NOTE — ED Provider Notes (Signed)
CSN: [id number]     Arrival date & time 01/19/14  2024 History   First MD Initiated Contact with Patient 01/19/14 2116     Chief Complaint  Patient presents with  . Seizures     (Consider location/radiation/quality/duration/timing/severity/associated sxs/prior Treatment) Patient is a 73 y.o. male presenting with syncope. The history is provided by the patient. The history is limited by the absence of a caregiver. No language interpreter was used.  Loss of Consciousness Episode history:  Single Timing:  Unable to specify Progression:  Resolved Chronicity:  Recurrent Context comment:  While sitting Witnessed: yes (by wife who is currently not available)   Relieved by:  Nothing Worsened by:  Nothing tried Ineffective treatments:  None tried Associated symptoms: weakness (generalized)   Associated symptoms: no chest pain, no confusion, no diaphoresis, no difficulty breathing, no dizziness, no fever, no headaches, no nausea, no palpitations, no shortness of breath, no visual change and no vomiting   Risk factors: seizures     Past Medical History  Diagnosis Date  . 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 Automatic implantable cardiac defibrillator in situ   . Congestive heart failure, unspecified   . Retention of urine, unspecified   . Pneumonia, organism unspecified   . Cough   . Acute bronchitis   . Contusion of foot   . Other sign and symptom in breast   . Fibromyalgia   . Hyperlipidemia   . Coronary atherosclerosis of unspecified type of vessel, native or graft   . Esophageal reflux   . Type I (juvenile type) diabetes mellitus without mention of complication, uncontrolled   . Essential hypertension, benign   . Cocaine abuse   . Alcohol abuse   . Microalbuminuria   . GERD (gastroesophageal reflux disease)   . Arteriovenous malformation (AVM)     of colon  . Diverticulosis   . GI bleed     secondary to avm  . Umbilical hernia   . 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 History of oxygen administration     at bedtime uses oxygen 2l/m nasally.   Past Surgical History  Procedure Laterality Date  . Back surgery      X 2  . Tonsillectomy    . Cardiac defibrillator placement    . Colonoscopy with propofol N/A 12/23/2013    Procedure: COLONOSCOPY WITH PROPOFOL;  Surgeon: Hart Carwinora M Brodie, MD;  Location: [facility];  Service: Endoscopy;  Laterality: N/A;  . Implantable cardioverter defibrillator (icd) generator change N/A 01/01/2012    Procedure: ICD GENERATOR CHANGE;  Surgeon: Marinus MawGregg W Taylor, MD;  [facility];  Service: Cardiovascular;  Laterality: N/A;   Family History  Problem Relation Age of Onset  . Heart failure Mother   . Diabetes Sister   . Colon cancer Brother     questionable  . Prostate cancer Neg Hx    History  Substance Use Topics  . Smoking status: Current Some Day Smoker -- 2.00 packs/day for 55 years    Types: Cigarettes  . Smokeless tobacco: Never Used     Comment: down to 6 cigarettes a day  . Alcohol Use: No     Comment: quit at the time of stroke    Review of Systems  Constitutional: Negative for fever, diaphoresis, activity change, appetite change and fatigue.  HENT: Negative for congestion, facial swelling, rhinorrhea and trouble swallowing.  Eyes: Negative for photophobia and pain.  Respiratory: Negative for cough, chest tightness and shortness of breath.   Cardiovascular: Positive for syncope. Negative for chest pain, palpitations and leg swelling.  Gastrointestinal: Negative for nausea, vomiting, abdominal pain, diarrhea and constipation.  Endocrine: Negative for polydipsia and polyuria.  Genitourinary: Negative for dysuria, urgency, decreased urine volume and difficulty urinating.  Musculoskeletal: Negative for back pain and gait problem.  Skin: Negative for color change, rash and wound.   Allergic/Immunologic: Negative for immunocompromised state.  Neurological: Positive for weakness (generalized). Negative for dizziness, facial asymmetry, speech difficulty, numbness and headaches.  Psychiatric/Behavioral: Negative for confusion, decreased concentration and agitation.      Allergies  Metformin  Home Medications   Prior to Admission medications   Medication Sig Start Date End Date Taking? Authorizing Provider  carvedilol (COREG) 25 MG tablet Take 12.5 mg by mouth every evening.   Yes Historical Provider, MD  divalproex (DEPAKOTE ER) 250 MG 24 hr tablet Take 250 mg by mouth every evening.   Yes Historical Provider, MD  finasteride (PROSCAR) 5 MG tablet Take 5 mg by mouth daily with breakfast.   Yes Historical Provider, MD  furosemide (LASIX) 40 MG tablet Take 40 mg by mouth daily with breakfast.   Yes Historical Provider, MD  insulin NPH Human (HUMULIN N,NOVOLIN N) 100 UNIT/ML injection Inject 10 Units into the skin daily before breakfast.   Yes Historical Provider, MD  potassium chloride SA (K-DUR,KLOR-CON) 20 MEQ tablet Take 20 mEq by mouth 2 (two) times daily.   Yes Historical Provider, MD  sertraline (ZOLOFT) 50 MG tablet Take 50 mg by mouth daily with breakfast.   Yes Historical Provider, MD  simvastatin (ZOCOR) 40 MG tablet Take 40 mg by mouth every evening.   Yes Historical Provider, MD  tamsulosin (FLOMAX) 0.4 MG CAPS capsule Take 0.4 mg by mouth daily after supper.   Yes Historical Provider, MD  valsartan (DIOVAN) 320 MG tablet Take 160 mg by mouth daily with breakfast.   Yes Historical Provider, MD   BP 157/53 mmHg  Pulse 52  Temp(Src) 98.1 F (36.7 C) (Oral)  Resp 15  Ht 5\' 8"  (1.727 m)  Wt 198 lb (89.812 kg)  BMI 30.11 kg/m2  SpO2 99% Physical Exam  Constitutional: He is oriented to person, place, and time. He appears well-developed and well-nourished. No distress.  HENT:  Head: Normocephalic and atraumatic.  Mouth/Throat: No oropharyngeal exudate.   Eyes: Pupils are equal, round, and reactive to light.  Neck: Normal range of motion. Neck supple.  Cardiovascular: Regular rhythm and normal heart sounds.  Bradycardia present.  Exam reveals no gallop and no friction rub.   No murmur heard. Pulmonary/Chest: Effort normal and breath sounds normal. No respiratory distress. He has no wheezes. He has no rales.  Abdominal: Soft. Bowel sounds are normal. He exhibits no distension and no mass. There is no tenderness. There is no rebound and no guarding.  Musculoskeletal: Normal range of motion. He exhibits no edema or tenderness.  Neurological: He is alert and oriented to person, place, and time. No cranial nerve deficit or sensory deficit. He exhibits abnormal muscle tone. GCS eye subscore is 4. GCS verbal subscore is 5. GCS motor subscore is 6.  L sided hemiparesis, chronic, unchanged from prior CVA  Skin: Skin is warm and dry.  Psychiatric: He has a normal mood and affect.    ED Course  Procedures (including critical care time) Labs Review Labs Reviewed  CBC WITH DIFFERENTIAL - Abnormal;  Notable for the following:    RBC 3.16 (*)    Hemoglobin 9.8 (*)    HCT 32.0 (*)    MCV 101.3 (*)    All other components within normal limits  COMPREHENSIVE METABOLIC PANEL - Abnormal; Notable for the following:    Glucose, Bld 104 (*)    Albumin 3.1 (*)    GFR calc non Af Amer 64 (*)    GFR calc Af Amer 74 (*)    All other components within normal limits  URINALYSIS, ROUTINE W REFLEX MICROSCOPIC - Abnormal; Notable for the following:    Color, Urine AMBER (*)    APPearance CLOUDY (*)    Bilirubin Urine SMALL (*)    Ketones, ur 15 (*)    Protein, ur 30 (*)    Leukocytes, UA TRACE (*)    All other components within normal limits  URINE MICROSCOPIC-ADD ON - Abnormal; Notable for the following:    Squamous Epithelial / LPF FEW (*)    Bacteria, UA FEW (*)    Casts GRANULAR CAST (*)    All other components within normal limits  URINE CULTURE   I-STAT TROPOININ, ED    Imaging Review Ct Head Wo Contrast  01/19/2014   CLINICAL DATA:  Syncope.  Seizure.  EXAM: CT HEAD WITHOUT CONTRAST  TECHNIQUE: Contiguous axial images were obtained from the base of the skull through the vertex without intravenous contrast.  COMPARISON:  06/28/2013  FINDINGS: Remote left thalamic infarct and remote lacunar infarct of the right globus pallidus nucleus.  Periventricular white matter and corona radiata hypodensities favor chronic ischemic microvascular white matter disease. Ex vacuo ventriculomegaly.  No intracranial hemorrhage, mass lesion, or acute CVA. Mild chronic ethmoid and right maxillary sinusitis. There is atherosclerotic calcification of the cavernous carotid arteries bilaterally.  IMPRESSION: 1. No acute intracranial findings. 2. Remote left thalamic infarct and remote right globus pallidus lacunar infarct. 3. Periventricular white matter and corona radiata hypodensities favor chronic ischemic microvascular white matter disease. 4. Mild chronic ethmoid and right maxillary sinusitis.   Electronically Signed   By: Herbie Baltimore M.D.   On: 01/19/2014 21:48     EKG Interpretation   Date/Time:  Monday January 19 2014 20:29:57 EST Ventricular Rate:  56 PR Interval:  264 QRS Duration: 164 QT Interval:  585 QTC Calculation: 565 R Axis:   -78 Text Interpretation:  Sinus rhythm Prolonged PR interval RBBB and LAFB  Abnormal T, consider ischemia, lateral leads Confirmed by Mable Lashley  MD,  Saron Vanorman ([id number]) on 01/19/2014 8:40:19 PM      MDM   Final diagnoses:  Seizure   Pt is a 73 y.o. male with Pmhx as above who presents with reported episode of unresponsiveness.  Patient appears alert and oriented at this time, but cannot give me details of the episode.  Other than that he felt sweaty and sleepy earlier today and that he thinks he "went out.  Once this afternoon."  He denies recent fever, chills, chest pain, shortness of breath, nausea, vomiting.   He has had some watery diarrhea.  Chart review, he has recently seen Dr. Ladona Ridgel with EP, and his device was working well.  He has felt no shocks from his defibrillator.  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  Given he cannot tell me is a history of seizures.  CT head was ordered and was negative for acute changes.  CBC, CMP noncontributory.  I-STAT troponin negative.  We'll wait  until patient's wife arrives to obtain additional history.   Wife has arrived and describes episodes as suddenly becoming weak with drooling and unresponsiveness.  She states the first episode today lasted 15-20 minutes and the second episode about 30 minutes.  He had no change in his breathing.  She states that he has a history of similar symptoms in the past that have been diagnosed as seizures.  For these, he has had EEGs and sees neurology. He takes Keppra.  After a chart review,  these episodes do indeed appear to be similar to prior episodes and he has had neurology consults with abnormal EEGs and were diagnosed as partial seizures. She also states he has had d/a for last several days, but no fever, vomiting, ab pain, or resp symptoms.    Pharmacy tech is also able to now do med rec and it appears that he is on Depakote, keppra.  Dr. Norlene Campbelltter will f/u on depakote level.  plan to load with depakote level was low, though I feel he can be d/c'd home as he is at his baseline mental status and episodes appears c/w prior episodes.    Toy CookeyMegan Deyanira Fesler, MD 01/20/14 [phone]

## 2014-01-19 NOTE — ED Notes (Signed)
[employer] called back and stated they were on the way to do defib interrogation.

## 2014-01-19 NOTE — ED Provider Notes (Signed)
Single chamber ICD check.  Normal device function.  No episodes.  Printout of check left in bin with stickers. Sensitivity:  15.0 Impedance:  700 Threshold:  0.6V@0 .4ms Shock impedance:  49 VVI 40 Therapy zones:  [address]  Joey Deakins [employer] [phone]   Erskine Emeryhris Jamyia Fortune, MD 01/19/14 [phone]

## 2014-01-19 NOTE — ED Notes (Signed)
Paged [employer] to interrogate pacemaker

## 2014-01-19 NOTE — ED Notes (Signed)
[employer] rep finished interrogation.

## 2014-01-19 NOTE — ED Notes (Signed)
EMS states pt's family called 911 after pt had several episodes of "going out". Family states pt will slumpover, drool and become unresponsive for 20 min at a time. This happened 3x today per family. Pt a/o x 4 on arrival to ED.EMS administered 324mg  aspirin and 250ml NS en route.

## 2014-01-19 NOTE — ED Notes (Signed)
Pt monitored by pulse ox, bp cuff, and 12-lead. 

## 2014-01-20 LAB — VALPROIC ACID LEVEL: VALPROIC ACID LVL: 48 ug/mL — AB (ref 50.0–100.0)

## 2014-01-20 MED ORDER — DIVALPROEX SODIUM 250 MG PO DR TAB
500.0000 mg | DELAYED_RELEASE_TABLET | Freq: Once | ORAL | Status: AC
  Administered 2014-01-20: 500 mg via ORAL
  Filled 2014-01-20: qty 2

## 2014-01-20 NOTE — Discharge Instructions (Signed)

## 2014-01-20 NOTE — ED Provider Notes (Signed)
Pt with altered mental status tonight, possible seizure, h/o prior seizures.  Recent diarrheal illness.  Needs depakote level.  Results for orders placed or performed during the hospital encounter of 01/19/14  CBC with Differential  Result Value Ref Range   WBC 7.9 4.0 - 10.5 K/uL   RBC 3.16 (L) 4.22 - 5.81 MIL/uL   Hemoglobin 9.8 (L) 13.0 - 17.0 g/dL   HCT 16.132.0 (L) 09.639.0 - 04.552.0 %   MCV 101.3 (H) 78.0 - 100.0 fL   MCH 31.0 26.0 - 34.0 pg   MCHC 30.6 30.0 - 36.0 g/dL   RDW 40.912.7 81.111.5 - 91.415.5 %   Platelets 213 150 - 400 K/uL   Neutrophils Relative % 70 43 - 77 %   Neutro Abs 5.5 1.7 - 7.7 K/uL   Lymphocytes Relative 19 12 - 46 %   Lymphs Abs 1.5 0.7 - 4.0 K/uL   Monocytes Relative 10 3 - 12 %   Monocytes Absolute 0.8 0.1 - 1.0 K/uL   Eosinophils Relative 1 0 - 5 %   Eosinophils Absolute 0.1 0.0 - 0.7 K/uL   Basophils Relative 0 0 - 1 %   Basophils Absolute 0.0 0.0 - 0.1 K/uL  Comprehensive metabolic panel  Result Value Ref Range   Sodium 143 137 - 147 mEq/L   Potassium 4.5 3.7 - 5.3 mEq/L   Chloride 104 96 - 112 mEq/L   CO2 27 19 - 32 mEq/L   Glucose, Bld 104 (H) 70 - 99 mg/dL   BUN 20 6 - 23 mg/dL   Creatinine, Ser 7.821.11 0.50 - 1.35 mg/dL   Calcium 8.7 8.4 - 95.610.5 mg/dL   Total Protein 7.1 6.0 - 8.3 g/dL   Albumin 3.1 (L) 3.5 - 5.2 g/dL   AST 10 0 - 37 U/L   ALT 6 0 - 53 U/L   Alkaline Phosphatase 57 39 - 117 U/L   Total Bilirubin 0.3 0.3 - 1.2 mg/dL   GFR calc non Af Amer 64 (L) >90 mL/min   GFR calc Af Amer 74 (L) >90 mL/min   Anion gap 12 5 - 15  Urinalysis, Routine w reflex microscopic  Result Value Ref Range   Color, Urine AMBER (A) YELLOW   APPearance CLOUDY (A) CLEAR   Specific Gravity, Urine 1.021 1.005 - 1.030   pH 5.0 5.0 - 8.0   Glucose, UA NEGATIVE NEGATIVE mg/dL   Hgb urine dipstick NEGATIVE NEGATIVE   Bilirubin Urine SMALL (A) NEGATIVE   Ketones, ur 15 (A) NEGATIVE mg/dL   Protein, ur 30 (A) NEGATIVE mg/dL   Urobilinogen, UA 1.0 0.0 - 1.0 mg/dL   Nitrite  NEGATIVE NEGATIVE   Leukocytes, UA TRACE (A) NEGATIVE  Urine microscopic-add on  Result Value Ref Range   Squamous Epithelial / LPF FEW (A) RARE   WBC, UA 0-2 <3 WBC/hpf   RBC / HPF 0-2 <3 RBC/hpf   Bacteria, UA FEW (A) RARE   Casts GRANULAR CAST (A) NEGATIVE  Valproic acid level  Result Value Ref Range   Valproic Acid Lvl 48.0 (L) 50.0 - 100.0 ug/mL  I-stat troponin, ED  Result Value Ref Range   Troponin i, poc 0.01 0.00 - 0.08 ng/mL   Comment 3           Ct Head Wo Contrast  01/19/2014   CLINICAL DATA:  Syncope.  Seizure.  EXAM: CT HEAD WITHOUT CONTRAST  TECHNIQUE: Contiguous axial images were obtained from the base of the skull through the vertex  without intravenous contrast.  COMPARISON:  06/28/2013  FINDINGS: Remote left thalamic infarct and remote lacunar infarct of the right globus pallidus nucleus.  Periventricular white matter and corona radiata hypodensities favor chronic ischemic microvascular white matter disease. Ex vacuo ventriculomegaly.  No intracranial hemorrhage, mass lesion, or acute CVA. Mild chronic ethmoid and right maxillary sinusitis. There is atherosclerotic calcification of the cavernous carotid arteries bilaterally.  IMPRESSION: 1. No acute intracranial findings. 2. Remote left thalamic infarct and remote right globus pallidus lacunar infarct. 3. Periventricular white matter and corona radiata hypodensities favor chronic ischemic microvascular white matter disease. 4. Mild chronic ethmoid and right maxillary sinusitis.   Electronically Signed   By: Herbie BaltimoreWalt  Liebkemann M.D.   On: 01/19/2014 21:48    Depakote level is low, patient to receive double dosing here tonight, family is comfortable with discharge home.  Olivia Mackielga M Neela Zecca, MD 01/20/14 [phone]

## 2014-01-20 NOTE — ED Notes (Signed)
Pt a/o x 4 on d/c in wheelchair with family. 

## 2014-01-21 LAB — URINE CULTURE
COLONY COUNT: NO GROWTH
CULTURE: NO GROWTH

## 2014-02-19 ENCOUNTER — Telehealth: Payer: Self-pay | Admitting: Internal Medicine

## 2014-02-19 MED ORDER — FINASTERIDE 5 MG PO TABS: 5.0000 mg | ORAL_TABLET | Freq: Every day | ORAL | Status: AC

## 2014-02-19 MED ORDER — CARVEDILOL 25 MG PO TABS: 12.5000 mg | ORAL_TABLET | Freq: Every evening | ORAL | Status: DC

## 2014-02-19 MED ORDER — TAMSULOSIN HCL 0.4 MG PO CAPS: 0.4000 mg | ORAL_CAPSULE | Freq: Every day | ORAL | Status: AC

## 2014-02-19 MED ORDER — POTASSIUM CHLORIDE CRYS ER 20 MEQ PO TBCR: 20.0000 meq | EXTENDED_RELEASE_TABLET | Freq: Two times a day (BID) | ORAL | Status: AC

## 2014-02-19 MED ORDER — SIMVASTATIN 40 MG PO TABS: 40.0000 mg | ORAL_TABLET | Freq: Every evening | ORAL | Status: AC

## 2014-02-19 MED ORDER — VALSARTAN 320 MG PO TABS: 160.0000 mg | ORAL_TABLET | Freq: Every day | ORAL | Status: DC

## 2014-02-19 MED ORDER — FUROSEMIDE 40 MG PO TABS: 40.0000 mg | ORAL_TABLET | Freq: Every day | ORAL | Status: DC

## 2014-02-19 MED ORDER — SERTRALINE HCL 50 MG PO TABS: 50.0000 mg | ORAL_TABLET | Freq: Every day | ORAL | Status: AC

## 2014-02-19 NOTE — Telephone Encounter (Signed)
Pt calling out of refills: Potassium, Valsartan, Tamsulosin, Simvastatin, Furosimide, Setrarline, Finasceride, [facility].

## 2014-02-19 NOTE — Telephone Encounter (Signed)
[facility]...Raechel Chute/lmb

## 2014-03-11 ENCOUNTER — Telehealth: Payer: Self-pay | Admitting: Internal Medicine

## 2014-03-11 NOTE — Telephone Encounter (Signed)
Done hardcopy to robin  

## 2014-03-11 NOTE — Telephone Encounter (Signed)
Is requesting script for patient for half rails and replacement on hospital bed mattress.  Is requesting call back in regards.

## 2014-03-11 NOTE — Telephone Encounter (Signed)
Informed the wife to pickup hardcopy at the front desk.. Also,  The patient was seen on 01/14/14 by Dr. Ladona Ridgelaylor and insurance will not pay due to there not being a referral for that appt..  The wife would like a referral.  Did inform was not sure this could be done since already had this appointment..Marland Kitchen

## 2014-03-11 NOTE — Telephone Encounter (Signed)
Office policy has always been for no retroactive referrals  Since this does not involve me, I will defer this to the office manager - Truddie HiddenLou R.

## 2014-04-15 ENCOUNTER — Ambulatory Visit (INDEPENDENT_AMBULATORY_CARE_PROVIDER_SITE_OTHER): Payer: Commercial Managed Care - HMO | Admitting: *Deleted

## 2014-04-15 DIAGNOSIS — I429 Cardiomyopathy, unspecified: Secondary | ICD-10-CM

## 2014-04-15 NOTE — Progress Notes (Signed)
Remote ICD transmission.   

## 2014-04-17 LAB — MDC_IDC_ENUM_SESS_TYPE_REMOTE
Date Time Interrogation Session: 20160309144600
HighPow Impedance: 53 Ohm
Implantable Pulse Generator Serial Number: 124263
Lead Channel Impedance Value: 755 Ohm
Lead Channel Pacing Threshold Amplitude: 0.6 V
Lead Channel Pacing Threshold Pulse Width: 0.4 ms
Lead Channel Sensing Intrinsic Amplitude: 14.8 mV
Lead Channel Setting Pacing Amplitude: 2.5 V
Lead Channel Setting Pacing Pulse Width: 0.4 ms
Lead Channel Setting Sensing Sensitivity: 0.4 mV
MDC IDC MSMT BATTERY REMAINING LONGEVITY: 144 mo
MDC IDC MSMT BATTERY REMAINING PERCENTAGE: 100 %
MDC IDC SET ZONE DETECTION INTERVAL: 286 ms
MDC IDC STAT BRADY RV PERCENT PACED: 9 %
Zone Setting Detection Interval: 333 ms

## 2014-04-20 ENCOUNTER — Emergency Department (HOSPITAL_COMMUNITY): Payer: Commercial Managed Care - HMO

## 2014-04-20 ENCOUNTER — Encounter (HOSPITAL_COMMUNITY): Payer: Self-pay

## 2014-04-20 ENCOUNTER — Emergency Department (HOSPITAL_COMMUNITY)
Admission: EM | Admit: 2014-04-20 | Discharge: 2014-04-21 | Disposition: A | Discharge status: Home / Self Care | Payer: Commercial Managed Care - HMO | Attending: Emergency Medicine | Admitting: Emergency Medicine

## 2014-04-20 DIAGNOSIS — Z87828 Personal history of other (healed) physical injury and trauma: Secondary | ICD-10-CM | POA: Insufficient documentation

## 2014-04-20 DIAGNOSIS — Z720 Tobacco use: Secondary | ICD-10-CM | POA: Diagnosis not present

## 2014-04-20 DIAGNOSIS — Z794 Long term (current) use of insulin: Secondary | ICD-10-CM | POA: Diagnosis not present

## 2014-04-20 DIAGNOSIS — Z7982 Long term (current) use of aspirin: Secondary | ICD-10-CM | POA: Insufficient documentation

## 2014-04-20 DIAGNOSIS — Z87438 Personal history of other diseases of male genital organs: Secondary | ICD-10-CM | POA: Diagnosis not present

## 2014-04-20 DIAGNOSIS — Z8709 Personal history of other diseases of the respiratory system: Secondary | ICD-10-CM | POA: Diagnosis not present

## 2014-04-20 DIAGNOSIS — R569 Unspecified convulsions: Secondary | ICD-10-CM | POA: Insufficient documentation

## 2014-04-20 DIAGNOSIS — M797 Fibromyalgia: Secondary | ICD-10-CM | POA: Insufficient documentation

## 2014-04-20 DIAGNOSIS — E785 Hyperlipidemia, unspecified: Secondary | ICD-10-CM | POA: Insufficient documentation

## 2014-04-20 DIAGNOSIS — I10 Essential (primary) hypertension: Secondary | ICD-10-CM | POA: Diagnosis not present

## 2014-04-20 DIAGNOSIS — E1065 Type 1 diabetes mellitus with hyperglycemia: Secondary | ICD-10-CM | POA: Insufficient documentation

## 2014-04-20 DIAGNOSIS — I2510 Atherosclerotic heart disease of native coronary artery without angina pectoris: Secondary | ICD-10-CM | POA: Diagnosis not present

## 2014-04-20 DIAGNOSIS — Z9981 Dependence on supplemental oxygen: Secondary | ICD-10-CM | POA: Diagnosis not present

## 2014-04-20 DIAGNOSIS — Z79899 Other long term (current) drug therapy: Secondary | ICD-10-CM | POA: Diagnosis not present

## 2014-04-20 DIAGNOSIS — I959 Hypotension, unspecified: Secondary | ICD-10-CM | POA: Diagnosis present

## 2014-04-20 DIAGNOSIS — Q2730 Arteriovenous malformation, site unspecified: Secondary | ICD-10-CM | POA: Insufficient documentation

## 2014-04-20 DIAGNOSIS — Z8701 Personal history of pneumonia (recurrent): Secondary | ICD-10-CM | POA: Diagnosis not present

## 2014-04-20 DIAGNOSIS — Z8719 Personal history of other diseases of the digestive system: Secondary | ICD-10-CM | POA: Insufficient documentation

## 2014-04-20 DIAGNOSIS — I509 Heart failure, unspecified: Secondary | ICD-10-CM | POA: Insufficient documentation

## 2014-04-20 DIAGNOSIS — Z8673 Personal history of transient ischemic attack (TIA), and cerebral infarction without residual deficits: Secondary | ICD-10-CM | POA: Insufficient documentation

## 2014-04-20 DIAGNOSIS — Z95810 Presence of automatic (implantable) cardiac defibrillator: Secondary | ICD-10-CM | POA: Insufficient documentation

## 2014-04-20 LAB — URINALYSIS, ROUTINE W REFLEX MICROSCOPIC
Bilirubin Urine: NEGATIVE
Glucose, UA: NEGATIVE mg/dL
HGB URINE DIPSTICK: NEGATIVE
Ketones, ur: NEGATIVE mg/dL
Leukocytes, UA: NEGATIVE
NITRITE: NEGATIVE
PH: 5 (ref 5.0–8.0)
Protein, ur: NEGATIVE mg/dL
SPECIFIC GRAVITY, URINE: 1.014 (ref 1.005–1.030)
UROBILINOGEN UA: 1 mg/dL (ref 0.0–1.0)

## 2014-04-20 LAB — BASIC METABOLIC PANEL
Anion gap: 8 (ref 5–15)
BUN: 22 mg/dL (ref 6–23)
CO2: 27 mmol/L (ref 19–32)
Calcium: 8 mg/dL — ABNORMAL LOW (ref 8.4–10.5)
Chloride: 102 mmol/L (ref 96–112)
Creatinine, Ser: 1.1 mg/dL (ref 0.50–1.35)
GFR calc Af Amer: 74 mL/min — ABNORMAL LOW (ref >90)
GFR calc non Af Amer: 64 mL/min — ABNORMAL LOW (ref >90)
Glucose, Bld: 77 mg/dL (ref 70–99)
POTASSIUM: 4.5 mmol/L (ref 3.5–5.1)
SODIUM: 137 mmol/L (ref 135–145)

## 2014-04-20 LAB — VALPROIC ACID LEVEL: Valproic Acid Lvl: 47.9 ug/mL — ABNORMAL LOW (ref 50.0–100.0)

## 2014-04-20 LAB — CBG MONITORING, ED: Glucose-Capillary: 71 mg/dL (ref 70–99)

## 2014-04-20 MED ORDER — DIVALPROEX SODIUM 250 MG PO DR TAB: 250.0000 mg | DELAYED_RELEASE_TABLET | Freq: Two times a day (BID) | ORAL | Status: DC

## 2014-04-20 MED ORDER — DIVALPROEX SODIUM 250 MG PO DR TAB: 750.0000 mg | DELAYED_RELEASE_TABLET | Freq: Two times a day (BID) | ORAL | Status: DC

## 2014-04-20 MED ORDER — VALPROATE SODIUM 500 MG/5ML IV SOLN
500.0000 mg | Freq: Once | INTRAVENOUS | Status: DC
  Filled 2014-04-20: qty 5

## 2014-04-20 MED ORDER — VALPROATE SODIUM 500 MG/5ML IV SOLN
250.0000 mg | Freq: Once | INTRAVENOUS | Status: AC
  Administered 2014-04-21: 250 mg via INTRAVENOUS
  Filled 2014-04-20: qty 2.5

## 2014-04-20 MED ORDER — DIVALPROEX SODIUM 500 MG PO DR TAB: 500.0000 mg | DELAYED_RELEASE_TABLET | Freq: Two times a day (BID) | ORAL | Status: DC

## 2014-04-20 NOTE — ED Notes (Signed)
Pt brought in EMS for seizure like activity x1 hr.  Pt has a hx of seizures that last for prolonged periods. Family reports this is not abnormal for him.  Pt was not responsive upon EMS arrival and was found to be hypotensive at 89 palpated.  Pt is alert and oriented at this time.

## 2014-04-20 NOTE — Discharge Instructions (Signed)
Please increase your valproic acid to 750mg  twice daily.  You will need to take one 250mg  tablet and one 500mg  tablet twice daily.  I have written you a new prescription.  Please follow up with your neurologist  Seizure, Adult A seizure is abnormal electrical activity in the brain. Seizures usually last from 30 seconds to 2 minutes. There are various types of seizures. Before a seizure, you may have a warning sensation (aura) that a seizure is about to occur. An aura may include the following symptoms:   Fear or anxiety.  Nausea.  Feeling like the room is spinning (vertigo).  Vision changes, such as seeing flashing lights or spots. Common symptoms during a seizure include:  A change in attention or behavior (altered mental status).  Convulsions with rhythmic jerking movements.  Drooling.  Rapid eye movements.  Grunting.  Loss of bladder and bowel control.  Bitter taste in the mouth.  Tongue biting. After a seizure, you may feel confused and sleepy. You may also have an injury resulting from convulsions during the seizure. HOME CARE INSTRUCTIONS   If you are given medicines, take them exactly as prescribed by your health care provider.  Keep all follow-up appointments as directed by your health care provider.  Do not swim or drive or engage in risky activity during which a seizure could cause further injury to you or others until your health care provider says it is OK.  Get adequate rest.  Teach friends and family what to do if you have a seizure. They should:  Lay you on the ground to prevent a fall.  Put a cushion under your head.  Loosen any tight clothing around your neck.  Turn you on your side. If vomiting occurs, this helps keep your airway clear.  Stay with you until you recover.  Know whether or not you need emergency care. SEEK IMMEDIATE MEDICAL CARE IF:  The seizure lasts longer than 5 minutes.  The seizure is severe or you do not wake up  immediately after the seizure.  You have an altered mental status after the seizure.  You are having more frequent or worsening seizures. Someone should drive you to the emergency department or call local emergency services (911 in [place] MAKE SURE YOU:  Understand these instructions.  Will watch your condition.  Will get help right away if you are not doing well or get worse. Document Released: 01/21/2000 Document Revised: 11/13/2012 Document Reviewed: 09/04/2012 [facility] Patient Information 2015 [employer] [place]. This information is not intended to replace advice given to you by your health care provider. Make sure you discuss any questions you have with your health care provider.

## 2014-04-20 NOTE — ED Provider Notes (Signed)
CSN: [id number]     Arrival date & time 04/20/14  1903 History   First MD Initiated Contact with Patient 04/20/14 1913     Chief Complaint  Patient presents with  . Hypotension     (Consider location/radiation/quality/duration/timing/severity/associated sxs/prior Treatment) Patient is a 74 y.o. male presenting with seizures.  Seizures Seizure type:  Petit mal Initial focality:  None Episode characteristics: confusion, limpness and unresponsiveness   Episode characteristics: no focal shaking   Return to baseline: yes   Severity:  Moderate Duration:  1 hour Timing:  Once Number of seizures this episode:  1 Progression:  Resolved Recent head injury:  No recent head injuries PTA treatment:  None History of seizures: yes     Past Medical History  Diagnosis Date  . 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 Automatic implantable cardiac defibrillator in situ   . Congestive heart failure, unspecified   . Retention of urine, unspecified   . Pneumonia, organism unspecified   . Cough   . Acute bronchitis   . Contusion of foot   . Other sign and symptom in breast   . Fibromyalgia   . Hyperlipidemia   . Coronary atherosclerosis of unspecified type of vessel, native or graft   . Esophageal reflux   . Type I (juvenile type) diabetes mellitus without mention of complication, uncontrolled   . Essential hypertension, benign   . Cocaine abuse   . Alcohol abuse   . Microalbuminuria   . GERD (gastroesophageal reflux disease)   . Arteriovenous malformation (AVM)     of colon  . Diverticulosis   . GI bleed     secondary to avm  . Umbilical hernia   . 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 History of oxygen administration     at bedtime uses oxygen 2l/m nasally.   Past Surgical History  Procedure Laterality Date  . Back surgery      X 2  . Tonsillectomy    . Cardiac  defibrillator placement    . Colonoscopy with propofol N/A 12/23/2013    Procedure: COLONOSCOPY WITH PROPOFOL;  Surgeon: Hart Carwinora M Brodie, MD;  Location: [facility];  Service: Endoscopy;  Laterality: N/A;  . Implantable cardioverter defibrillator (icd) generator change N/A 01/01/2012    Procedure: ICD GENERATOR CHANGE;  Surgeon: Marinus MawGregg W Taylor, MD;  [facility];  Service: Cardiovascular;  Laterality: N/A;   Family History  Problem Relation Age of Onset  . Heart failure Mother   . Diabetes Sister   . Colon cancer Brother     questionable  . Prostate cancer Neg Hx    History  Substance Use Topics  . Smoking status: Current Some Day Smoker -- 2.00 packs/day for 55 years    Types: Cigarettes  . Smokeless tobacco: Never Used     Comment: down to 6 cigarettes a day  . Alcohol Use: No     Comment: quit at the time of stroke    Review of Systems  Constitutional: Negative for fever and chills.  Eyes: Negative for redness.  Respiratory: Negative for cough and shortness of breath.   Cardiovascular: Negative for chest pain.  Gastrointestinal: Negative for nausea, vomiting, abdominal pain and diarrhea.  Genitourinary: Negative for dysuria.  Skin: Negative for rash.  Neurological: Positive for seizures. Negative for headaches.  All other systems reviewed and are negative.     Allergies  Metformin  Home Medications   Prior to Admission medications   Medication Sig Start Date End Date Taking? Authorizing Provider  aspirin EC 81 MG tablet Take 81 mg by mouth daily.   Yes Historical Provider, MD  carvedilol (COREG) 25 MG tablet Take 0.5 tablets (12.5 mg total) by mouth every evening. 02/19/14  Yes Corwin Levins, MD  divalproex (DEPAKOTE) 500 MG DR tablet Take 500 mg by mouth 2 (two) times daily.   Yes Historical Provider, MD  finasteride (PROSCAR) 5 MG tablet Take 1 tablet (5 mg total) by mouth daily with breakfast. 02/19/14  Yes Corwin Levins, MD  furosemide (LASIX) 40 MG tablet  Take 1 tablet (40 mg total) by mouth daily with breakfast. 02/19/14  Yes Corwin Levins, MD  insulin NPH Human (HUMULIN N,NOVOLIN N) 100 UNIT/ML injection Inject 10 Units into the skin daily before breakfast.   Yes Historical Provider, MD  potassium chloride SA (K-DUR,KLOR-CON) 20 MEQ tablet Take 1 tablet (20 mEq total) by mouth 2 (two) times daily. 02/19/14  Yes Corwin Levins, MD  sertraline (ZOLOFT) 50 MG tablet Take 1 tablet (50 mg total) by mouth daily with breakfast. 02/19/14  Yes Corwin Levins, MD  simvastatin (ZOCOR) 40 MG tablet Take 1 tablet (40 mg total) by mouth every evening. 02/19/14  Yes Corwin Levins, MD  tamsulosin (FLOMAX) 0.4 MG CAPS capsule Take 1 capsule (0.4 mg total) by mouth daily after supper. 02/19/14  Yes Corwin Levins, MD  valsartan (DIOVAN) 320 MG tablet Take 0.5 tablets (160 mg total) by mouth daily with breakfast. 02/19/14  Yes Corwin Levins, MD   BP 134/60 mmHg  Pulse 52  Temp(Src) 97.4 F (36.3 C) (Oral)  Resp 13  Ht  (1.727 m)  Wt 190 lb (86.183 kg)  BMI 28.90 kg/m2  SpO2 100% Physical Exam  Constitutional: He is oriented to person, place, and time. No distress.  HENT:  Head: Normocephalic and atraumatic.  Eyes: EOM are normal. Pupils are equal, round, and reactive to light.  Neck: Normal range of motion. Neck supple.  Cardiovascular: Normal rate.   Pulmonary/Chest: Effort normal. No respiratory distress.  Abdominal: Soft. There is no tenderness.  Musculoskeletal: Normal range of motion.  Neurological: He is alert and oriented to person, place, and time.  Skin: No rash noted. He is not diaphoretic.  Psychiatric: He has a normal mood and affect.    Cranial Nerves  II:  Visual fields Intact, pupillary equal round and reactive to light III, IV, VI: Full eye movement without nystagmus  V Facial Sensation: Normal. No weakness of masticatory muscles  VII: No facial weakness or asymmetry  VIII Auditory Acuity: Grossly normal  IX/X: The uvula is midline; the  palate elevates symmetrically  XI: Normal sternocleidomastoid and trapezius strength  XII: The tongue is midline. No atrophy or fasciculations.  Motor System: baseline right sided deficit  Sensation: Intact to light touch.     ED Course  Procedures (including critical care time) Labs Review Labs Reviewed  BASIC METABOLIC PANEL - Abnormal; Notable for the following:    Calcium 8.0 (*)    GFR calc non Af Amer 64 (*)    GFR calc Af Amer 74 (*)    All other components within normal limits  URINALYSIS, ROUTINE W REFLEX MICROSCOPIC - Abnormal; Notable for the following:    APPearance CLOUDY (*)    All other components within normal limits  VALPROIC ACID LEVEL - Abnormal; Notable for  the following:    Valproic Acid Lvl 47.9 (*)    All other components within normal limits  CBG MONITORING, ED  CBG MONITORING, ED    Imaging Review Ct Head Wo Contrast  04/20/2014   CLINICAL DATA:  Seizure activity for 1 atherosclerosis seizure activity for 1 hour. Hypertension.  EXAM: CT HEAD WITHOUT CONTRAST  TECHNIQUE: Contiguous axial images were obtained from the base of the skull through the vertex without intravenous contrast.  COMPARISON:  Head CT scan 01/19/2014.  FINDINGS: Cortical atrophy and chronic microvascular ischemic change are identified. Remote infarction in the left thalamus is noted. No evidence of acute abnormality including infarct, hemorrhage, mass lesion, mass effect, midline shift or abnormal extra-axial fluid collection is seen. No hydrocephalus or pneumocephalus. The calvarium is intact.  IMPRESSION: No acute abnormality.  Atrophy, chronic microvascular ischemic change remote left thalamic infarct.   Electronically Signed   By: Drusilla Kanner M.D.   On: 04/20/2014 20:32   Dg Chest Portable 1 View  04/20/2014   CLINICAL DATA:  Initial evaluation for seizure today with history of hypertension and congestive heart failure  EXAM: PORTABLE CHEST - 1 VIEW  COMPARISON:  09/19/2013   FINDINGS: Mild cardiac enlargement. Vascular pattern normal. Single lead defibrillator stable. Lungs clear.  IMPRESSION: No active disease.   Electronically Signed   By: Esperanza Heir M.D.   On: 04/20/2014 20:01     EKG Interpretation None      MDM   Final diagnoses:  None    74 y/o male w/ PMH seizures, prev CVA who presents after having a breakthrough seizure.  Exam as above, patient is back to his baseline per his wife who accompanies him.  He has baseline deficits 2/2 previous CVA's and is mobile by wheelchair.  Patient hasn't missed any medicines, no recent illnesses.  The seizure he had today was his typical seizure just longer.  They typically last 15 min or so but this one was approx 1 hour.  Have obtain electrolytes which were unremarkable.  Also obtained head CT which was unremarkable.  Valproic acid level which was just below therapeutic.  I discussed this pt with the neurologist on call.  We will load with depakote in the ED and increase his home depakote to 750 mg bid.  Will have  Them then follow up with neuro.  At this time, he is back to baseline w/o any ongoing seizure activity so I feel discharge w/ outpatient f/u and modification of medicines is appropriate.  I have discussed the results, Dx and Tx plan with the patient. They expressed understanding and agree with the plan and were told to return to ED with any worsening of condition or concern.    Disposition: Discharge  Condition: Good  Discharge Medication List as of 04/20/2014 11:55 PM      Follow Up: [facility] EMERGENCY DEPARTMENT [address] [id number] mc [place] [address] [phone] In 1 week    Pt seen in conjunction with Dr. Janice Coffin, MD 04/21/14 0217  Azalia Bilis, MD 04/21/14 [phone]

## 2014-04-21 NOTE — ED Notes (Signed)
Patient and family updated on plan of care. Patient to be D/C after receiving Valproate infusion.

## 2014-04-23 ENCOUNTER — Encounter: Payer: Self-pay | Admitting: Cardiology

## 2014-04-23 ENCOUNTER — Encounter (HOSPITAL_COMMUNITY): Payer: Self-pay

## 2014-04-23 ENCOUNTER — Emergency Department (HOSPITAL_COMMUNITY)
Admission: EM | Admit: 2014-04-23 | Discharge: 2014-04-24 | Disposition: A | Discharge status: Home / Self Care | Payer: Commercial Managed Care - HMO | Attending: Emergency Medicine | Admitting: Emergency Medicine

## 2014-04-23 DIAGNOSIS — Z95810 Presence of automatic (implantable) cardiac defibrillator: Secondary | ICD-10-CM | POA: Insufficient documentation

## 2014-04-23 DIAGNOSIS — Z8701 Personal history of pneumonia (recurrent): Secondary | ICD-10-CM | POA: Diagnosis not present

## 2014-04-23 DIAGNOSIS — E109 Type 1 diabetes mellitus without complications: Secondary | ICD-10-CM | POA: Diagnosis not present

## 2014-04-23 DIAGNOSIS — I10 Essential (primary) hypertension: Secondary | ICD-10-CM | POA: Insufficient documentation

## 2014-04-23 DIAGNOSIS — Z79899 Other long term (current) drug therapy: Secondary | ICD-10-CM | POA: Diagnosis not present

## 2014-04-23 DIAGNOSIS — I2510 Atherosclerotic heart disease of native coronary artery without angina pectoris: Secondary | ICD-10-CM | POA: Diagnosis not present

## 2014-04-23 DIAGNOSIS — Z9981 Dependence on supplemental oxygen: Secondary | ICD-10-CM | POA: Insufficient documentation

## 2014-04-23 DIAGNOSIS — Z8739 Personal history of other diseases of the musculoskeletal system and connective tissue: Secondary | ICD-10-CM | POA: Diagnosis not present

## 2014-04-23 DIAGNOSIS — Z87828 Personal history of other (healed) physical injury and trauma: Secondary | ICD-10-CM | POA: Diagnosis not present

## 2014-04-23 DIAGNOSIS — I509 Heart failure, unspecified: Secondary | ICD-10-CM | POA: Diagnosis not present

## 2014-04-23 DIAGNOSIS — Z720 Tobacco use: Secondary | ICD-10-CM | POA: Insufficient documentation

## 2014-04-23 DIAGNOSIS — Z794 Long term (current) use of insulin: Secondary | ICD-10-CM | POA: Diagnosis not present

## 2014-04-23 DIAGNOSIS — Z8719 Personal history of other diseases of the digestive system: Secondary | ICD-10-CM | POA: Diagnosis not present

## 2014-04-23 DIAGNOSIS — Z8673 Personal history of transient ischemic attack (TIA), and cerebral infarction without residual deficits: Secondary | ICD-10-CM | POA: Insufficient documentation

## 2014-04-23 DIAGNOSIS — Z7982 Long term (current) use of aspirin: Secondary | ICD-10-CM | POA: Diagnosis not present

## 2014-04-23 DIAGNOSIS — R569 Unspecified convulsions: Secondary | ICD-10-CM

## 2014-04-23 DIAGNOSIS — E785 Hyperlipidemia, unspecified: Secondary | ICD-10-CM | POA: Insufficient documentation

## 2014-04-23 LAB — VALPROIC ACID LEVEL: Valproic Acid Lvl: 55.9 ug/mL (ref 50.0–100.0)

## 2014-04-23 MED ORDER — DIVALPROEX SODIUM 250 MG PO DR TAB
750.0000 mg | DELAYED_RELEASE_TABLET | Freq: Once | ORAL | Status: AC
  Administered 2014-04-23: 750 mg via ORAL
  Filled 2014-04-23: qty 3

## 2014-04-23 MED ORDER — DEXTROSE 5 % IV SOLN
250.0000 mg | Freq: Once | INTRAVENOUS | Status: AC
  Administered 2014-04-23: 250 mg via INTRAVENOUS
  Filled 2014-04-23: qty 2.5

## 2014-04-23 MED ORDER — VALPROATE SODIUM 500 MG/5ML IV SOLN: 250.0000 mg | Freq: Once | INTRAVENOUS | Status: DC

## 2014-04-23 MED ORDER — DIVALPROEX SODIUM 500 MG PO DR TAB: 1000.0000 mg | DELAYED_RELEASE_TABLET | Freq: Two times a day (BID) | ORAL | Status: DC

## 2014-04-23 NOTE — ED Provider Notes (Signed)
CSN: [id number]     Arrival date & time 04/23/14  1949 History   First MD Initiated Contact with Patient 04/23/14 1950     Chief Complaint  Patient presents with  . Seizures     (Consider location/radiation/quality/duration/timing/severity/associated sxs/prior Treatment) Patient is a 74 y.o. male presenting with seizures. The history is provided by the patient and the EMS personnel.  Seizures Seizure activity on arrival: no   Seizure type:  Petit mal Initial focality:  None Episode characteristics: limpness and unresponsiveness   Episode characteristics: no combativeness, no confusion, no focal shaking and no stiffening   Return to baseline: yes   Severity:  Mild Timing:  Clustered Number of seizures this episode:  2 Progression:  Resolved Context: intracranial lesion (CVA) and previous head injury     Past Medical History  Diagnosis Date  . 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 Automatic implantable cardiac defibrillator in situ   . Congestive heart failure, unspecified   . Retention of urine, unspecified   . Pneumonia, organism unspecified   . Cough   . Acute bronchitis   . Contusion of foot   . Other sign and symptom in breast   . Fibromyalgia   . Hyperlipidemia   . Coronary atherosclerosis of unspecified type of vessel, native or graft   . Esophageal reflux   . Type I (juvenile type) diabetes mellitus without mention of complication, uncontrolled   . Essential hypertension, benign   . Cocaine abuse   . Alcohol abuse   . Microalbuminuria   . GERD (gastroesophageal reflux disease)   . Arteriovenous malformation (AVM)     of colon  . Diverticulosis   . GI bleed     secondary to avm  . Umbilical hernia   . 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 History of oxygen administration     at bedtime uses oxygen 2l/m nasally.   Past Surgical History  Procedure  Laterality Date  . Back surgery      X 2  . Tonsillectomy    . Cardiac defibrillator placement    . Colonoscopy with propofol N/A 12/23/2013    Procedure: COLONOSCOPY WITH PROPOFOL;  Surgeon: Hart Carwin, MD;  Location: [facility];  Service: Endoscopy;  Laterality: N/A;  . Implantable cardioverter defibrillator (icd) generator change N/A 01/01/2012    Procedure: ICD GENERATOR CHANGE;  Surgeon: Marinus Maw, MD;  Location: [facility] CATH LAB;  Service: Cardiovascular;  Laterality: N/A;   Family History  Problem Relation Age of Onset  . Heart failure Mother   . Diabetes Sister   . Colon cancer Brother     questionable  . Prostate cancer Neg Hx    History  Substance Use Topics  . Smoking status: Current Some Day Smoker -- 2.00 packs/day for 55 years    Types: Cigarettes  . Smokeless tobacco: Never Used     Comment: down to 6 cigarettes a day  . Alcohol Use: No     Comment: quit at the time of stroke    Review of Systems  Neurological: Positive for seizures.  All other systems reviewed and are negative.     Allergies  Metformin  Home Medications   Prior to Admission medications   Medication Sig Start Date End Date Taking? Authorizing Provider  aspirin EC 81 MG tablet Take 81 mg by mouth daily.   Yes Historical Provider,  MD  carvedilol (COREG) 25 MG tablet Take 0.5 tablets (12.5 mg total) by mouth every evening. 02/19/14  Yes Corwin LevinsJames W John, MD  finasteride (PROSCAR) 5 MG tablet Take 1 tablet (5 mg total) by mouth daily with breakfast. 02/19/14  Yes Corwin LevinsJames W John, MD  furosemide (LASIX) 40 MG tablet Take 1 tablet (40 mg total) by mouth daily with breakfast. 02/19/14  Yes Corwin LevinsJames W John, MD  insulin NPH Human (HUMULIN N,NOVOLIN N) 100 UNIT/ML injection Inject 10 Units into the skin daily before breakfast.   Yes Historical Provider, MD  loperamide (IMODIUM) 1 MG/5ML solution Take 6 mg by mouth as needed for diarrhea or loose stools (TAKES 30ML AS NEEDED).    Yes Historical Provider, MD   potassium chloride SA (K-DUR,KLOR-CON) 20 MEQ tablet Take 1 tablet (20 mEq total) by mouth 2 (two) times daily. 02/19/14  Yes Corwin LevinsJames W John, MD  sertraline (ZOLOFT) 50 MG tablet Take 1 tablet (50 mg total) by mouth daily with breakfast. 02/19/14  Yes Corwin LevinsJames W John, MD  simvastatin (ZOCOR) 40 MG tablet Take 1 tablet (40 mg total) by mouth every evening. 02/19/14  Yes Corwin LevinsJames W John, MD  tamsulosin (FLOMAX) 0.4 MG CAPS capsule Take 1 capsule (0.4 mg total) by mouth daily after supper. 02/19/14  Yes Corwin LevinsJames W John, MD  valsartan (DIOVAN) 320 MG tablet Take 0.5 tablets (160 mg total) by mouth daily with breakfast. 02/19/14  Yes Corwin LevinsJames W John, MD  divalproex (DEPAKOTE) 500 MG DR tablet Take 2 tablets (1,000 mg total) by mouth 2 (two) times daily. 04/23/14   Dorna LeitzAlex Berl Bonfanti, MD   BP 167/60 mmHg  Pulse 50  Temp(Src) 97.5 F (36.4 C) (Oral)  Resp 17  Ht 5\' 8"  (1.727 m)  Wt 190 lb (86.183 kg)  BMI 28.90 kg/m2  SpO2 98% Physical Exam  Constitutional: He is oriented to person, place, and time. He appears well-developed and well-nourished. No distress.  Alert, cooperative, neurologic baseline.  HENT:  Head: Normocephalic and atraumatic.  Mouth/Throat: Oropharynx is clear and moist. No oropharyngeal exudate.  Eyes: Conjunctivae and EOM are normal. Pupils are equal, round, and reactive to light.  Neck: Normal range of motion. Neck supple.  Cardiovascular: Normal rate, regular rhythm, normal heart sounds and intact distal pulses.  Exam reveals no gallop and no friction rub.   No murmur heard. Pulmonary/Chest: Effort normal and breath sounds normal. No respiratory distress. He has no wheezes. He has no rales.  Abdominal: Soft. He exhibits no distension and no mass. There is no tenderness. There is no rebound and no guarding.  Musculoskeletal: Normal range of motion. He exhibits no edema or tenderness.  Lymphadenopathy:    He has no cervical adenopathy.  Neurological: He is alert and oriented to person, place, and  time. No cranial nerve deficit or sensory deficit. He displays no seizure activity. Coordination normal. Abnormal gait: deferred. GCS eye subscore is 4. GCS verbal subscore is 5. GCS motor subscore is 6.  3 out of 5 strength in right upper extremity and right lower extremity. 5 out of 5 strength in left upper extremity and left lower extremity.  Skin: Skin is warm and dry. No rash noted. He is not diaphoretic.  Psychiatric: He has a normal mood and affect. His behavior is normal. Judgment and thought content normal.  Nursing note and vitals reviewed.   ED Course  Procedures (including critical care time) Labs Review Labs Reviewed  VALPROIC ACID LEVEL    Imaging Review No results found.  EKG Interpretation   Date/Time:  Thursday April 23 2014 20:05:19 EDT Ventricular Rate:  55 PR Interval:  243 QRS Duration: 162 QT Interval:  611 QTC Calculation: 584 R Axis:   -81 Text Interpretation:  Sinus rhythm Prolonged PR interval RBBB and LAFB  Abnormal T, consider ischemia, lateral leads Abnormal ekg since last  tracing no significant change Confirmed by Hyacinth Meeker  MD, BRIAN ([id number]) on  04/23/2014 8:07:55 PM      MDM   Final diagnoses:  Seizure    74 year old male presents with petite mall seizure. 2 episodes today that were back-to-back but did return to baseline between. No fevers. No other symptoms. He has his chronic right-sided weakness which her deficits from a previous CVA. He was seen here 2 days ago and had CT head, BMP, urinalysis all of which were within normal limits. Depakote level was low and he was loaded and increased his dose to 750 mg twice a day.  We will recheck a Depakote level. He has no other focal signs or symptoms concerning for reasons for increased seizure activity. I will get further history when the wife arrives. Following Depakote level, we will discuss with neurology. He is back to baseline on my exam.  11:30 PM Depakote level on the low end of normal.  Discussed with neurology and additional Depakote load given. We will increase Depakote to 1000 mg twice a day. Still to follow up with neurology as an outpatient. Has remained at neurologic baseline since arrival and without further seizure activity.  Dorna Leitz, MD 04/23/14 [id number]  Eber Hong, MD 04/26/14 [phone]

## 2014-04-23 NOTE — ED Notes (Signed)
MD at bedside. 

## 2014-04-23 NOTE — ED Provider Notes (Signed)
74 year old male, history of seizure disorder, history of stroke involving the right side of his body presents with recurrent focal seizures. The patient does have a history of this, he was seen in the emergency department 2 days ago and was subtherapeutic on his Depakote, at this time the patient has no complaints, he is not having any more seizures and is at baseline according to paramedics and family, on exam he has slight right upper and right lower extremity weakness compared to the left.  Medical screening examination/treatment/procedure(s) were conducted as a shared visit with non-physician practitioner(s) and myself.  I personally evaluated the patient during the encounter.  Clinical Impression:   Final diagnoses:  Seizure         Eber HongBrian Morse Brueggemann, MD 04/26/14 [phone]

## 2014-04-23 NOTE — ED Notes (Signed)
PER EMS: pt from home, wife reports pt had a focal seizure at 1730 and another seizure an hour later. He was sitting in wheelchair and suddenly "just stopped talking." hx of stroke with residual right side weakness. Pt was not responsive when EMS arrived and when he was placed in ambulance he started responding to his name and answering questions appropriately. Pt is now alert but still confused and slow to respond. BP initially 70/40, given 500cc NaCl and BP increased to 90/50. HR-60. CBG-91.

## 2014-04-23 NOTE — Discharge Instructions (Signed)
Epilepsy °People with epilepsy have times when they shake and jerk uncontrollably (seizures). This happens when there is a sudden change in brain function. Epilepsy may have many possible causes. Anything that disturbs the normal pattern of brain cell activity can lead to seizures. °HOME CARE  °· Follow your doctor's instructions about driving and safety during normal activities. °· Get enough sleep. °· Only take medicine as told by your doctor. °· Avoid things that you know can cause you to have seizures (triggers). °· Write down when your seizures happen and what you remember about each seizure. Write down anything you think may have caused the seizure to happen. °· Tell the people you live and work with that you have seizures. Make sure they know how to help you. They should: °¨ Cushion your head and body. °¨ Turn you on your side. °¨ Not restrain you. °¨ Not place anything inside your mouth. °¨ Call for local emergency medical help if there is any question about what has happened. °· Keep all follow-up visits with your doctor. This is very important. °GET HELP IF: °· You get an infection or start to feel sick. You may have more seizures when you are sick. °· You are having seizures more often. °· Your seizure pattern is changing. °GET HELP RIGHT AWAY IF:  °· A seizure does not stop after a few seconds or minutes. °· A seizure causes you to have trouble breathing. °· A seizure gives you a very bad headache. °· A seizure makes you unable to speak or use a part of your body. °Document Released: 11/20/2008 Document Revised: 11/13/2012 Document Reviewed: 09/04/2012 °[employer]® Patient Information ©2015 [employer]. This information is not intended to replace advice given to you by your health care provider. Make sure you discuss any questions you have with your health care provider. ° °

## 2014-04-29 ENCOUNTER — Encounter: Payer: Self-pay | Admitting: Internal Medicine

## 2014-05-06 ENCOUNTER — Encounter: Payer: Self-pay | Admitting: Internal Medicine

## 2014-05-06 ENCOUNTER — Ambulatory Visit (INDEPENDENT_AMBULATORY_CARE_PROVIDER_SITE_OTHER): Payer: Commercial Managed Care - HMO | Admitting: Internal Medicine

## 2014-05-06 ENCOUNTER — Telehealth: Payer: Self-pay | Admitting: Internal Medicine

## 2014-05-06 VITALS — BP 130/88 | HR 69 | Resp 18

## 2014-05-06 DIAGNOSIS — Z66 Do not resuscitate: Secondary | ICD-10-CM | POA: Diagnosis not present

## 2014-05-06 DIAGNOSIS — E1065 Type 1 diabetes mellitus with hyperglycemia: Secondary | ICD-10-CM | POA: Diagnosis not present

## 2014-05-06 DIAGNOSIS — R569 Unspecified convulsions: Secondary | ICD-10-CM

## 2014-05-06 DIAGNOSIS — IMO0002 Reserved for concepts with insufficient information to code with codable children: Secondary | ICD-10-CM

## 2014-05-06 MED ORDER — LEVETIRACETAM 500 MG PO TABS: 500.0000 mg | ORAL_TABLET | Freq: Two times a day (BID) | ORAL | Status: DC

## 2014-05-06 NOTE — Progress Notes (Signed)
Pre visit review using our clinic review tool, if applicable. No additional management support is needed unless otherwise documented below in the visit note. 

## 2014-05-06 NOTE — Patient Instructions (Signed)
OK to stop the valproid acid  Please take all new medication as prescribed - the keppra at starting dose  You will be contacted regarding the referral for: neurology  Please continue all other medications as before, and refills have been done if requested.  Please have the pharmacy call with any other refills you may need.  Please keep your appointments with your specialists as you may have planned  Please go to the LAB in the Basement (turn left off the elevator) for the tests to be done today  You will be contacted by phone if any changes need to be made immediately.  Otherwise, you will receive a letter about your results with an explanation, but please check with MyChart first.  Please remember to sign up for MyChart if you have not done so, as this will be important to you in the future with finding out test results, communicating by private email, and scheduling acute appointments online when needed.  Please return in 3 months, or sooner if needed

## 2014-05-06 NOTE — Assessment & Plan Note (Signed)
a1c has been drifiting down lately, cant r/o low sugars, will need repeat labs, check cbg's, consider d/c insulin as only taking 10 units, and has had recent wt loss Lab Results  Component Value Date   HGBA1C 7.1* 09/18/2013

## 2014-05-06 NOTE — Telephone Encounter (Signed)
emmi emailed °

## 2014-05-06 NOTE — Assessment & Plan Note (Signed)
Remain uncontrolled despite increased therapeutic dosing valproic acid; to d/c this, and start keppra 500 bid, with eye to increase to 1000 bid in 2 wks if not working well, also refer neurology as referral is required by insurance and has apparently been a limiting factor on neuro f/u.  Will hold on further imaging at this time.  Recent basic bmp neg for acute.  Will ask for other lab such as cbc, lfts, tsh, B12 as well.  Note:  Total time for pt hx, exam, review of record with pt in the room, determination of diagnoses and plan for further eval and tx is > 40 min, with over 50% spent in coordination and counseling of patient

## 2014-05-06 NOTE — Assessment & Plan Note (Signed)
Wife agrees, has been DNR in past as well.

## 2014-05-06 NOTE — Progress Notes (Signed)
Subjective:    Patient ID: [name], male    DOB: 1940-12-13, 74 y.o.   MRN: [id number]  HPI Here with wife, late for appt as pt had siezure along the way.  Seen in ED recently, has been having nearly daily siezures such that he becomes unresponsive, for prolonged periods of time up to 1 hr.  Wife has called EMS several times, tries to wait until siezure last for > 45 min.  Has been taking his meds -  Was on valproic acid 500 bid, then on 750 bid , now 1000 bid with recent incresaed due to breakthrough sz activity, the most recent increase.since Apr 23, 2014 ED visit, valproic acid level 55 at that time.   Pt currently sleepy but will open eyes and try to follow simple commands weakly, such as lifting the left hand, or taking deep breaths for ausculation.  Wife notes overall deterioration in last few wks with gradual worsening general weakness, some decreased po intake though will take pills.   Seems to be staying in bed more, lack of energy, fell twice last wk,  appetite lower , lost 8 lbs since dec. No wt done today , cannot stand well, was able to stand before with assist, though has had PT lastly done 2015, stopped due to nonprogression, wife feels not much sense to re-start at this time. Wt Readings from Last 3 Encounters:  04/23/14 190 lb (86.183 kg)  04/20/14 190 lb (86.183 kg)  01/19/14 198 lb (89.812 kg)  Also with ? Worsening dementia, cannot seem to recall how to use the controls on his power wheelchair, though has been weak as welll.  Recent Ct head neg for acute mar 14.  Has seen Dr Lin Landsman in past, but has been delayed in f/u apparently recent due to admin issue with getting a [employer] required referral. Has been on Keppra in the past per wife, not sure why changed, but she think tolerated ok.  Wife is not amenable to NH and remains ultimately supportive, though she does mentioned increased requirements in last few wks with cleaning up incont or urine and bowel.  She would be amenable  to Hospice if felt to that point, and is currently DNR.  No recent fever, recent UA neg.  Not checking cbg's recently, still taking insulin though low dose,. Past Medical History  Diagnosis Date  . 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 Automatic implantable cardiac defibrillator in situ   . Congestive heart failure, unspecified   . Retention of urine, unspecified   . Pneumonia, organism unspecified   . Cough   . Acute bronchitis   . Contusion of foot   . Other sign and symptom in breast   . Fibromyalgia   . Hyperlipidemia   . Coronary atherosclerosis of unspecified type of vessel, native or graft   . Esophageal reflux   . Type I (juvenile type) diabetes mellitus without mention of complication, uncontrolled   . Essential hypertension, benign   . Cocaine abuse   . Alcohol abuse   . Microalbuminuria   . GERD (gastroesophageal reflux disease)   . Arteriovenous malformation (AVM)     of colon  . Diverticulosis   . GI bleed     secondary to avm  . Umbilical hernia   . 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 History of oxygen administration  at bedtime uses oxygen 2l/m nasally.   Past Surgical History  Procedure Laterality Date  . Back surgery      X 2  . Tonsillectomy    . Cardiac defibrillator placement    . Colonoscopy with propofol N/A 12/23/2013    Procedure: COLONOSCOPY WITH PROPOFOL;  Surgeon: Hart Carwin, MD;  Location: [facility] ENDOS[facility];  Service: Endoscopy;  Laterality: N/A;  . Implantable cardioverter defibrillator (icd) generator change N/A 01/01/2012    Procedure: ICD GENERATOR CHANGE;  Surgeon: Marinus Maw, MD;  Location: [facility] CATH LAB;  Service: Cardiovascular;  Laterality: N/A;    reports that he has been smoking Cigarettes.  He has a 110 pack-year smoking history. He has never used smokeless tobacco. He reports that he does not drink alcohol or use illicit  drugs. family history includes Colon cancer in his brother; Diabetes in his sister; Heart failure in his mother. There is no history of Prostate cancer. Allergies  Allergen Reactions  . Metformin Diarrhea   Current Outpatient Prescriptions on File Prior to Visit  Medication Sig Dispense Refill  . aspirin EC 81 MG tablet Take 81 mg by mouth daily.    . carvedilol (COREG) 25 MG tablet Take 0.5 tablets (12.5 mg total) by mouth every evening. 45 tablet 2  . finasteride (PROSCAR) 5 MG tablet Take 1 tablet (5 mg total) by mouth daily with breakfast. 90 tablet 2  . furosemide (LASIX) 40 MG tablet Take 1 tablet (40 mg total) by mouth daily with breakfast. 90 tablet 2  . insulin NPH Human (HUMULIN N,NOVOLIN N) 100 UNIT/ML injection Inject 10 Units into the skin daily before breakfast.    . loperamide (IMODIUM) 1 MG/5ML solution Take 6 mg by mouth as needed for diarrhea or loose stools (TAKES AS NEEDED).     Marland Kitchen potassium chloride SA (K-DUR,KLOR-CON) 20 MEQ tablet Take 1 tablet (20 mEq total) by mouth 2 (two) times daily. 180 tablet 2  . sertraline (ZOLOFT) 50 MG tablet Take 1 tablet (50 mg total) by mouth daily with breakfast. 90 tablet 2  . simvastatin (ZOCOR) 40 MG tablet Take 1 tablet (40 mg total) by mouth every evening. 90 tablet 2  . tamsulosin (FLOMAX) 0.4 MG CAPS capsule Take 1 capsule (0.4 mg total) by mouth daily after supper. 90 capsule 2  . valsartan (DIOVAN) 320 MG tablet Take 0.5 tablets (160 mg total) by mouth daily with breakfast. 45 tablet 2   No current facility-administered medications on file prior to visit.    Review of Systems Unable due to dementia    Objective:   Physical Exam BP 130/88 mmHg  Pulse 69  Resp 18  SpO2 97% VS noted, ? thinner Constitutional: Pt appears in no significant distress, though somnolent, easily aroused, opens eyes, tracks briefly, tries to weakly follow commands, then back to sleep sitting up in power wheelchair with some swaying in the seat    HENT: Head: NCAT.  Right Ear: External ear normal.  Left Ear: External ear normal.  Eyes: . Pupils are equal, round, and reactive to light. Conjunctivae and EOM are normal Neck: Normal range of motion. Neck supple.  Cardiovascular: Normal rate and regular rhythm.   Pulmonary/Chest: Effort normal and breath sounds without rales or wheezing but poor effort.  Abd:  Soft, NT, ND, + BS Neurological: Pt as above,.+ confused , motor with persistent known right sided weakness Skin: Skin is warm. No rash, no LE edema Psychiatric:  No agitation. no overt sz activity  noted    Assessment & Plan:

## 2014-05-11 ENCOUNTER — Emergency Department (HOSPITAL_COMMUNITY): Payer: Commercial Managed Care - HMO

## 2014-05-11 ENCOUNTER — Encounter: Payer: Self-pay | Admitting: Neurology

## 2014-05-11 ENCOUNTER — Encounter (HOSPITAL_COMMUNITY): Payer: Self-pay | Admitting: *Deleted

## 2014-05-11 ENCOUNTER — Ambulatory Visit (INDEPENDENT_AMBULATORY_CARE_PROVIDER_SITE_OTHER): Payer: Commercial Managed Care - HMO | Admitting: Neurology

## 2014-05-11 ENCOUNTER — Inpatient Hospital Stay (HOSPITAL_COMMUNITY)
Admission: EM | Admit: 2014-05-11 | Discharge: 2014-05-13 | DRG: 315 | Disposition: A | Discharge status: Home / Self Care | Payer: Commercial Managed Care - HMO | Attending: Internal Medicine | Admitting: Internal Medicine

## 2014-05-11 VITALS — BP 70/40 | HR 61 | Resp 20 | Ht 66.0 in

## 2014-05-11 DIAGNOSIS — R569 Unspecified convulsions: Secondary | ICD-10-CM

## 2014-05-11 DIAGNOSIS — I10 Essential (primary) hypertension: Secondary | ICD-10-CM | POA: Diagnosis present

## 2014-05-11 DIAGNOSIS — Z7982 Long term (current) use of aspirin: Secondary | ICD-10-CM | POA: Diagnosis not present

## 2014-05-11 DIAGNOSIS — E119 Type 2 diabetes mellitus without complications: Secondary | ICD-10-CM

## 2014-05-11 DIAGNOSIS — D539 Nutritional anemia, unspecified: Secondary | ICD-10-CM | POA: Diagnosis present

## 2014-05-11 DIAGNOSIS — R4182 Altered mental status, unspecified: Secondary | ICD-10-CM | POA: Insufficient documentation

## 2014-05-11 DIAGNOSIS — I69351 Hemiplegia and hemiparesis following cerebral infarction affecting right dominant side: Secondary | ICD-10-CM

## 2014-05-11 DIAGNOSIS — Z794 Long term (current) use of insulin: Secondary | ICD-10-CM | POA: Diagnosis not present

## 2014-05-11 DIAGNOSIS — Z66 Do not resuscitate: Secondary | ICD-10-CM | POA: Diagnosis present

## 2014-05-11 DIAGNOSIS — K219 Gastro-esophageal reflux disease without esophagitis: Secondary | ICD-10-CM | POA: Diagnosis present

## 2014-05-11 DIAGNOSIS — D696 Thrombocytopenia, unspecified: Secondary | ICD-10-CM | POA: Diagnosis present

## 2014-05-11 DIAGNOSIS — E039 Hypothyroidism, unspecified: Secondary | ICD-10-CM | POA: Diagnosis present

## 2014-05-11 DIAGNOSIS — R5383 Other fatigue: Secondary | ICD-10-CM

## 2014-05-11 DIAGNOSIS — E785 Hyperlipidemia, unspecified: Secondary | ICD-10-CM | POA: Diagnosis present

## 2014-05-11 DIAGNOSIS — Z800 Family history of malignant neoplasm of digestive organs: Secondary | ICD-10-CM

## 2014-05-11 DIAGNOSIS — Z993 Dependence on wheelchair: Secondary | ICD-10-CM | POA: Diagnosis not present

## 2014-05-11 DIAGNOSIS — D649 Anemia, unspecified: Secondary | ICD-10-CM | POA: Diagnosis present

## 2014-05-11 DIAGNOSIS — R627 Adult failure to thrive: Secondary | ICD-10-CM | POA: Diagnosis present

## 2014-05-11 DIAGNOSIS — M797 Fibromyalgia: Secondary | ICD-10-CM | POA: Diagnosis present

## 2014-05-11 DIAGNOSIS — G40211 Localization-related (focal) (partial) symptomatic epilepsy and epileptic syndromes with complex partial seizures, intractable, with status epilepticus: Secondary | ICD-10-CM | POA: Insufficient documentation

## 2014-05-11 DIAGNOSIS — G40909 Epilepsy, unspecified, not intractable, without status epilepticus: Secondary | ICD-10-CM

## 2014-05-11 DIAGNOSIS — R7989 Other specified abnormal findings of blood chemistry: Secondary | ICD-10-CM

## 2014-05-11 DIAGNOSIS — Z95810 Presence of automatic (implantable) cardiac defibrillator: Secondary | ICD-10-CM | POA: Diagnosis not present

## 2014-05-11 DIAGNOSIS — I255 Ischemic cardiomyopathy: Secondary | ICD-10-CM | POA: Diagnosis present

## 2014-05-11 DIAGNOSIS — I5022 Chronic systolic (congestive) heart failure: Secondary | ICD-10-CM

## 2014-05-11 DIAGNOSIS — R001 Bradycardia, unspecified: Secondary | ICD-10-CM

## 2014-05-11 DIAGNOSIS — Z833 Family history of diabetes mellitus: Secondary | ICD-10-CM | POA: Diagnosis not present

## 2014-05-11 DIAGNOSIS — I509 Heart failure, unspecified: Secondary | ICD-10-CM | POA: Diagnosis not present

## 2014-05-11 DIAGNOSIS — I952 Hypotension due to drugs: Secondary | ICD-10-CM

## 2014-05-11 DIAGNOSIS — Z8249 Family history of ischemic heart disease and other diseases of the circulatory system: Secondary | ICD-10-CM

## 2014-05-11 DIAGNOSIS — I959 Hypotension, unspecified: Secondary | ICD-10-CM | POA: Diagnosis present

## 2014-05-11 DIAGNOSIS — I2510 Atherosclerotic heart disease of native coronary artery without angina pectoris: Secondary | ICD-10-CM | POA: Diagnosis present

## 2014-05-11 DIAGNOSIS — E860 Dehydration: Secondary | ICD-10-CM | POA: Diagnosis present

## 2014-05-11 DIAGNOSIS — D6489 Other specified anemias: Secondary | ICD-10-CM | POA: Diagnosis not present

## 2014-05-11 DIAGNOSIS — E118 Type 2 diabetes mellitus with unspecified complications: Secondary | ICD-10-CM

## 2014-05-11 DIAGNOSIS — R404 Transient alteration of awareness: Secondary | ICD-10-CM | POA: Diagnosis not present

## 2014-05-11 DIAGNOSIS — Z87891 Personal history of nicotine dependence: Secondary | ICD-10-CM | POA: Diagnosis not present

## 2014-05-11 DIAGNOSIS — R531 Weakness: Secondary | ICD-10-CM

## 2014-05-11 LAB — CBC WITH DIFFERENTIAL/PLATELET
BASOS PCT: 0 % (ref 0–1)
Basophils Absolute: 0 10*3/uL (ref 0.0–0.1)
Eosinophils Absolute: 0.1 10*3/uL (ref 0.0–0.7)
Eosinophils Relative: 1 % (ref 0–5)
HEMATOCRIT: 31.5 % — AB (ref 39.0–52.0)
HEMOGLOBIN: 9.8 g/dL — AB (ref 13.0–17.0)
LYMPHS PCT: 15 % (ref 12–46)
Lymphs Abs: 1.6 10*3/uL (ref 0.7–4.0)
MCH: 32 pg (ref 26.0–34.0)
MCHC: 31.1 g/dL (ref 30.0–36.0)
MCV: 102.9 fL — ABNORMAL HIGH (ref 78.0–100.0)
Monocytes Absolute: 1.6 10*3/uL — ABNORMAL HIGH (ref 0.1–1.0)
Monocytes Relative: 14 % — ABNORMAL HIGH (ref 3–12)
Neutro Abs: 7.8 10*3/uL — ABNORMAL HIGH (ref 1.7–7.7)
Neutrophils Relative %: 71 % (ref 43–77)
Platelets: 100 10*3/uL — ABNORMAL LOW (ref 150–400)
RBC: 3.06 MIL/uL — AB (ref 4.22–5.81)
RDW: 13.4 % (ref 11.5–15.5)
WBC: 11 10*3/uL — AB (ref 4.0–10.5)

## 2014-05-11 LAB — COMPREHENSIVE METABOLIC PANEL
ALBUMIN: 2.7 g/dL — AB (ref 3.5–5.2)
ALK PHOS: 64 U/L (ref 39–117)
ALT: 10 U/L (ref 0–53)
AST: 18 U/L (ref 0–37)
Anion gap: 9 (ref 5–15)
BUN: 33 mg/dL — ABNORMAL HIGH (ref 6–23)
CHLORIDE: 103 mmol/L (ref 96–112)
CO2: 29 mmol/L (ref 19–32)
Calcium: 8.6 mg/dL (ref 8.4–10.5)
Creatinine, Ser: 1.32 mg/dL (ref 0.50–1.35)
GFR calc non Af Amer: 51 mL/min — ABNORMAL LOW (ref >90)
GFR, EST AFRICAN AMERICAN: 60 mL/min — AB (ref >90)
Glucose, Bld: 127 mg/dL — ABNORMAL HIGH (ref 70–99)
POTASSIUM: 5 mmol/L (ref 3.5–5.1)
Sodium: 141 mmol/L (ref 135–145)
Total Bilirubin: 0.7 mg/dL (ref 0.3–1.2)
Total Protein: 7.4 g/dL (ref 6.0–8.3)

## 2014-05-11 LAB — I-STAT TROPONIN, ED
TROPONIN I, POC: 0 ng/mL (ref 0.00–0.08)
Troponin i, poc: 0.01 ng/mL (ref 0.00–0.08)
Troponin i, poc: 0 ng/mL (ref 0.00–0.08)

## 2014-05-11 LAB — I-STAT CG4 LACTIC ACID, ED
LACTIC ACID, VENOUS: 1.64 mmol/L (ref 0.5–2.0)
LACTIC ACID, VENOUS: 1.48 mmol/L (ref 0.5–2.0)
Lactic Acid, Venous: 2.6 mmol/L (ref 0.5–2.0)

## 2014-05-11 LAB — CBG MONITORING, ED
GLUCOSE-CAPILLARY: 106 mg/dL — AB (ref 70–99)
Glucose-Capillary: 131 mg/dL — ABNORMAL HIGH (ref 70–99)

## 2014-05-11 LAB — URINALYSIS, ROUTINE W REFLEX MICROSCOPIC
Bilirubin Urine: NEGATIVE
Glucose, UA: NEGATIVE mg/dL
Hgb urine dipstick: NEGATIVE
Ketones, ur: NEGATIVE mg/dL
LEUKOCYTES UA: NEGATIVE
NITRITE: NEGATIVE
PH: 5 (ref 5.0–8.0)
Protein, ur: NEGATIVE mg/dL
SPECIFIC GRAVITY, URINE: 1.014 (ref 1.005–1.030)
Urobilinogen, UA: 1 mg/dL (ref 0.0–1.0)

## 2014-05-11 LAB — I-STAT VENOUS BLOOD GAS, ED
Bicarbonate: 26.1 mEq/L — ABNORMAL HIGH (ref 20.0–24.0)
O2 SAT: 61 %
PCO2 VEN: 48.2 mmHg (ref 45.0–50.0)
TCO2: 28 mmol/L (ref 0–100)
pH, Ven: 7.342 — ABNORMAL HIGH (ref 7.250–7.300)
pO2, Ven: 34 mmHg (ref 30.0–45.0)

## 2014-05-11 LAB — GLUCOSE, CAPILLARY
Glucose-Capillary: 59 mg/dL — ABNORMAL LOW (ref 70–99)
Glucose-Capillary: 65 mg/dL — ABNORMAL LOW (ref 70–99)
Glucose-Capillary: 156 mg/dL — ABNORMAL HIGH (ref 70–99)

## 2014-05-11 LAB — RETICULOCYTES
RBC.: 2.84 MIL/uL — AB (ref 4.22–5.81)
RETIC CT PCT: 2.6 % (ref 0.4–3.1)
Retic Count, Absolute: 73.8 10*3/uL (ref 19.0–186.0)

## 2014-05-11 LAB — VALPROIC ACID LEVEL: VALPROIC ACID LVL: 75.9 ug/mL (ref 50.0–100.0)

## 2014-05-11 LAB — TSH: TSH: 5.248 u[IU]/mL — ABNORMAL HIGH (ref 0.350–4.500)

## 2014-05-11 LAB — BRAIN NATRIURETIC PEPTIDE: B Natriuretic Peptide: 173.2 pg/mL — ABNORMAL HIGH (ref 0.0–100.0)

## 2014-05-11 MED ORDER — ONDANSETRON HCL 4 MG/2ML IJ SOLN: 4.0000 mg | Freq: Four times a day (QID) | INTRAMUSCULAR | Status: DC | PRN

## 2014-05-11 MED ORDER — LEVETIRACETAM 500 MG PO TABS
500.0000 mg | ORAL_TABLET | Freq: Two times a day (BID) | ORAL | Status: DC
  Administered 2014-05-11: 500 mg via ORAL
  Filled 2014-05-11: qty 1

## 2014-05-11 MED ORDER — ONDANSETRON HCL 4 MG PO TABS: 4.0000 mg | ORAL_TABLET | Freq: Four times a day (QID) | ORAL | Status: DC | PRN

## 2014-05-11 MED ORDER — SODIUM CHLORIDE 0.9 % IV BOLUS (SEPSIS)
1000.0000 mL | Freq: Once | INTRAVENOUS | Status: AC
Start: 2014-05-11 — End: 2014-05-11
  Administered 2014-05-11: 1000 mL via INTRAVENOUS

## 2014-05-11 MED ORDER — SODIUM CHLORIDE 0.9 % IV SOLN
INTRAVENOUS | Status: DC
  Administered 2014-05-11 – 2014-05-13 (×3): via INTRAVENOUS

## 2014-05-11 MED ORDER — GLUCOSE-VITAMIN C 4-6 GM-MG PO CHEW
CHEWABLE_TABLET | ORAL | Status: AC
  Administered 2014-05-11: 4 g
  Filled 2014-05-11: qty 1

## 2014-05-11 MED ORDER — ASPIRIN EC 81 MG PO TBEC
81.0000 mg | DELAYED_RELEASE_TABLET | Freq: Every day | ORAL | Status: DC
  Administered 2014-05-12 – 2014-05-13 (×2): 81 mg via ORAL
  Filled 2014-05-11 (×2): qty 1

## 2014-05-11 MED ORDER — FINASTERIDE 5 MG PO TABS
5.0000 mg | ORAL_TABLET | Freq: Every day | ORAL | Status: DC
  Administered 2014-05-12 – 2014-05-13 (×2): 5 mg via ORAL
  Filled 2014-05-11 (×2): qty 1

## 2014-05-11 MED ORDER — SODIUM CHLORIDE 0.9 % IV SOLN
1.0000 mg/h | Freq: Once | INTRAVENOUS | Status: DC
  Filled 2014-05-11: qty 10

## 2014-05-11 MED ORDER — LOPERAMIDE HCL 1 MG/5ML PO LIQD
6.0000 mg | ORAL | Status: DC | PRN
  Filled 2014-05-11: qty 30

## 2014-05-11 MED ORDER — TAMSULOSIN HCL 0.4 MG PO CAPS
0.4000 mg | ORAL_CAPSULE | Freq: Every day | ORAL | Status: DC
Start: 2014-05-11 — End: 2014-05-13
  Administered 2014-05-11 – 2014-05-12 (×2): 0.4 mg via ORAL
  Filled 2014-05-11 (×2): qty 1

## 2014-05-11 MED ORDER — HEPARIN SODIUM (PORCINE) 5000 UNIT/ML IJ SOLN
5000.0000 [IU] | Freq: Three times a day (TID) | INTRAMUSCULAR | Status: DC
  Administered 2014-05-11 – 2014-05-13 (×6): 5000 [IU] via SUBCUTANEOUS
  Filled 2014-05-11 (×5): qty 1

## 2014-05-11 MED ORDER — SIMVASTATIN 40 MG PO TABS
40.0000 mg | ORAL_TABLET | Freq: Every evening | ORAL | Status: DC
  Administered 2014-05-11 – 2014-05-12 (×2): 40 mg via ORAL
  Filled 2014-05-11 (×2): qty 1

## 2014-05-11 MED ORDER — SODIUM CHLORIDE 0.9 % IV BOLUS (SEPSIS)
1000.0000 mL | Freq: Once | INTRAVENOUS | Status: AC
  Administered 2014-05-11: 1000 mL via INTRAVENOUS

## 2014-05-11 MED ORDER — INSULIN ASPART 100 UNIT/ML ~~LOC~~ SOLN
0.0000 [IU] | Freq: Three times a day (TID) | SUBCUTANEOUS | Status: DC
  Administered 2014-05-12: 1 [IU] via SUBCUTANEOUS
  Administered 2014-05-12: 2 [IU] via SUBCUTANEOUS
  Administered 2014-05-13: 3 [IU] via SUBCUTANEOUS

## 2014-05-11 MED ORDER — SODIUM CHLORIDE 0.9 % IV SOLN
10.0000 ug/h | Freq: Once | INTRAVENOUS | Status: DC
  Filled 2014-05-11: qty 50

## 2014-05-11 MED ORDER — SERTRALINE HCL 50 MG PO TABS
50.0000 mg | ORAL_TABLET | Freq: Every day | ORAL | Status: DC
  Administered 2014-05-12 – 2014-05-13 (×2): 50 mg via ORAL
  Filled 2014-05-11 (×2): qty 1

## 2014-05-11 MED ORDER — DEXTROSE 50 % IV SOLN
INTRAVENOUS | Status: AC
  Administered 2014-05-11: 50 mL
  Filled 2014-05-11: qty 50

## 2014-05-11 MED ORDER — INSULIN ASPART 100 UNIT/ML ~~LOC~~ SOLN: 0.0000 [IU] | Freq: Every day | SUBCUTANEOUS | Status: DC

## 2014-05-11 NOTE — H&P (Signed)
[facility] History and Physical                                                                                    [name], is a 74 y.o. male  MRN: [id number]   DOB - [birth year]  Admit Date - 05/11/2014  Outpatient Primary MD for the patient is Oliver Barre, MD  With History of -  Past Medical History  Diagnosis Date  . 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 Automatic implantable cardiac defibrillator in situ   . Congestive heart failure, unspecified   . Retention of urine, unspecified   . Pneumonia, organism unspecified   . Cough   . Acute bronchitis   . Contusion of foot   . Other sign and symptom in breast   . Fibromyalgia   . Hyperlipidemia   . Coronary atherosclerosis of unspecified type of vessel, native or graft   . Esophageal reflux   . Type I (juvenile type) diabetes mellitus without mention of complication, uncontrolled   . Essential hypertension, benign   . Cocaine abuse   . Alcohol abuse   . Microalbuminuria   . GERD (gastroesophageal reflux disease)   . Arteriovenous malformation (AVM)     of colon  . Diverticulosis   . GI bleed     secondary to avm  . Umbilical hernia   . 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 History of oxygen administration     at bedtime uses oxygen 2l/m nasally.      Past Surgical History  Procedure Laterality Date  . Back surgery      X 2  . Tonsillectomy    . Cardiac defibrillator placement    . Colonoscopy with propofol N/A 12/23/2013    Procedure: COLONOSCOPY WITH PROPOFOL;  Surgeon: Hart Carwin, MD;  Location: [facility]OS[facility];  Service: Endoscopy;  Laterality: N/A;  . Implantable cardioverter defibrillator (icd) generator change N/A 01/01/2012    Procedure: ICD GENERATOR CHANGE;  Surgeon: Marinus Maw, MD;  Location: [facility] CATH LAB;  Service: Cardiovascular;  Laterality: N/A;    in for   Chief Complaint    Patient presents with  . Seizures  . Altered Mental Status     HPI This is a 74 year old male patient past history of stroke in 2010 and has been nonambulatory since. He also has mild systolic heart failure, hypertension and recently diagnosed with seizures last November. According to his wife was at the bedside providing history the patient had progressive decline since November 2015 when he was admitted for new onset seizures. Noted with variable oral intake since that time. He's had issues with chronic diarrhea which currently are not affecting him. He has been evaluated by Dr. Dickie La for this in the past. Because of patient's variable oral intake the wife has had to adjust how frequently she administers his long-acting insulin because of recurrent hypoglycemia. Also last year because of lowered blood pressure they've had to just his cardiac medications. Patient does have known dysphagia but doesn't do well with thickened liquids. The wife  reports that her grandson and other family members help with the patient at home and she has a hospital bed and other appropriate equipment at home. The wife reports that the patient has requested to not ever be placed in a nursing facility. She confirms that the patient is a DO NOT RESUSCITATE noting that he already has poor quality of life. Prior to most recent changes patient had been able to operate his motorized wheelchair on good days.  Today during evaluation at the neurology office the patient's systolic blood pressure was 70/40 with a heart rate of 61 and he was lethargic. He was subsequently sent to the ER for further evaluation. His arrival to the ER the Patient has been given 1500 mL of fluid and current blood pressure is 130/50 with a heart rate of 50. His rectal temperature was low at 96.5. Initially he was somewhat drowsy and lethargic but with improvement in blood pressure patient's mentation is also improved. Initial lactic acid was 2.6 but after fluid  hydration restoration of appropriate blood pressure lactic acid has decreased to 1.64. BNP was 173, and troponin 2 has been negative. BUN was slightly elevated at 33 with a creatinine of 1.3. Glucose was 127. White count was 11,000 with a hemoglobin of 9.8, MCV 102.9 and platelet count 100,000. Urinalysis was unremarkable and does not appear consistent with infection. Because of presentation with elevated lactic acid and hypotension a septic workup was initiated and blood cultures and urine culture are pending. Chest x-ray demonstrated no acute findings.  Review of Systems   In addition to the HPI above,  No Fever-chills, myalgias or other constitutional symptoms No Headache, changes with Vision or hearing, new weakness, tingling, numbness in any extremity, No Chest pain, Cough or Shortness of Breath, palpitations, orthopnea or DOE No Abdominal pain, N/V; no melena or hematochezia, no dark tarry stools, Bowel movements are regular, No dysuria, hematuria or flank pain No new skin rashes, lesions, masses or bruises, No new joints pains-aches No polyuria, polydypsia or polyphagia,  *A full 10 point Review of Systems was done, except as stated above, all other Review of Systems were negative.  Social History History  Substance Use Topics  . Smoking status: Former Smoker -- 2.00 packs/day for 55 years    Types: Cigarettes  . Smokeless tobacco: Never Used  . Alcohol Use: No     Comment: quit at the time of stroke    Family History Family History  Problem Relation Age of Onset  . Heart failure Mother   . Diabetes Sister   . Colon cancer Brother     questionable  . Prostate cancer Neg Hx     Prior to Admission medications   Medication Sig Start Date End Date Taking? Authorizing Provider  aspirin EC 81 MG tablet Take 81 mg by mouth daily.    Historical Provider, MD  carvedilol (COREG) 25 MG tablet Take 0.5 tablets (12.5 mg total) by mouth every evening. 02/19/14   Corwin LevinsJames W John, MD    divalproex (DEPAKOTE) 250 MG DR tablet 250 mg. Take 2 tablets twice daily 04/21/14   Historical Provider, MD  divalproex (DEPAKOTE) 500 MG DR tablet 500 mg. Take 1 tablet twice daily 04/15/14   Historical Provider, MD  finasteride (PROSCAR) 5 MG tablet Take 1 tablet (5 mg total) by mouth daily with breakfast. 02/19/14   Corwin LevinsJames W John, MD  furosemide (LASIX) 40 MG tablet Take 1 tablet (40 mg total) by mouth daily with breakfast. 02/19/14  Corwin Levins, MD  insulin NPH Human (HUMULIN N,NOVOLIN N) 100 UNIT/ML injection Inject 10 Units into the skin daily before breakfast.    Historical Provider, MD  levETIRAcetam (KEPPRA) 500 MG tablet Take 1 tablet (500 mg total) by mouth 2 (two) times daily. Patient not taking: Reported on 05/11/2014 05/06/14   Corwin Levins, MD  loperamide (IMODIUM) 1 MG/5ML solution Take 6 mg by mouth as needed for diarrhea or loose stools (TAKES AS NEEDED).     Historical Provider, MD  potassium chloride SA (K-DUR,KLOR-CON) 20 MEQ tablet Take 1 tablet (20 mEq total) by mouth 2 (two) times daily. 02/19/14   Corwin Levins, MD  sertraline (ZOLOFT) 50 MG tablet Take 1 tablet (50 mg total) by mouth daily with breakfast. 02/19/14   Corwin Levins, MD  simvastatin (ZOCOR) 40 MG tablet Take 1 tablet (40 mg total) by mouth every evening. 02/19/14   Corwin Levins, MD  tamsulosin (FLOMAX) 0.4 MG CAPS capsule Take 1 capsule (0.4 mg total) by mouth daily after supper. 02/19/14   Corwin Levins, MD  valsartan (DIOVAN) 320 MG tablet Take 0.5 tablets (160 mg total) by mouth daily with breakfast. 02/19/14   Corwin Levins, MD    Allergies  Allergen Reactions  . Metformin Diarrhea    Physical Exam  Vitals  Blood pressure 101/68, pulse 34, temperature 96.5 F (35.8 C), temperature source Rectal, resp. rate 14, SpO2 95 %.   General:  In no acute distress, appears chronically ill  Psych:  Normal affect, Denies Suicidal or Homicidal ideations, Awake Alert, Oriented X name and place. Speech and thought  patterns are clear and appropriate  Neuro: CN II through XII are grossly intact, chronic right side deficits/hemiplegia from prior stroke, chronic bilateral foot drop  ENT:  Ears and Eyes appear Normal, Conjunctivae clear, PER. Moist oral mucosa without erythema or exudates.  Neck:  Supple, No lymphadenopathy appreciated  Respiratory:  Symmetrical chest wall movement, Good air movement bilaterally, CTAB. Room Air  Cardiac:  RRR, No Murmurs, no LE edema noted, no JVD, No carotid bruits, peripheral pulses palpable at 2+  Abdomen:  Positive bowel sounds, Soft, Non tender, Non distended,  No masses appreciated, no obvious hepatosplenomegaly  Skin:  No Cyanosis, Normal Skin Turgor, No Skin Rash, diffuse chronic ecchymosis on extremities especially arms  Extremities: Symmetrical without obvious trauma or injury,  no effusions.  Data Review  CBC  Recent Labs Lab 05/11/14 1350  WBC 11.0*  HGB 9.8*  HCT 31.5*  PLT 100*  MCV 102.9*  MCH 32.0  MCHC 31.1  RDW 13.4  LYMPHSABS 1.6  MONOABS 1.6*  EOSABS 0.1  BASOSABS 0.0    Chemistries   Recent Labs Lab 05/11/14 1350  NA 141  K 5.0  CL 103  CO2 29  GLUCOSE 127*  BUN 33*  CREATININE 1.32  CALCIUM 8.6  AST 18  ALT 10  ALKPHOS 64  BILITOT 0.7    CrCl cannot be calculated (Unknown ideal weight.).  No results for input(s): TSH, T4TOTAL, T3FREE, THYROIDAB in the last 72 hours.  Invalid input(s): FREET3  Coagulation profile No results for input(s): INR, PROTIME in the last 168 hours.  No results for input(s): DDIMER in the last 72 hours.  Cardiac Enzymes No results for input(s): CKMB, TROPONINI, MYOGLOBIN in the last 168 hours.  Invalid input(s): CK  Invalid input(s): POCBNP  Urinalysis    Component Value Date/Time   COLORURINE YELLOW 05/11/2014 1435   APPEARANCEUR  CLEAR 05/11/2014 1435   LABSPEC 1.014 05/11/2014 1435   PHURINE 5.0 05/11/2014 1435   GLUCOSEU NEGATIVE 05/11/2014 1435   GLUCOSEU NEGATIVE  08/18/2011 1511   HGBUR NEGATIVE 05/11/2014 1435   BILIRUBINUR NEGATIVE 05/11/2014 1435   KETONESUR NEGATIVE 05/11/2014 1435   PROTEINUR NEGATIVE 05/11/2014 1435   UROBILINOGEN 1.0 05/11/2014 1435   NITRITE NEGATIVE 05/11/2014 1435   LEUKOCYTESUR NEGATIVE 05/11/2014 1435    Imaging results:   Dg Chest 1 View  05/11/2014   CLINICAL DATA:  Seizures, altered mental status.  EXAM: CHEST  1 VIEW  COMPARISON:  04/20/2014.  FINDINGS: Trachea is midline. Heart size normal. Lungs are clear. No pleural fluid. ICD lead tip projects over the right ventricle.  IMPRESSION: No acute findings.   Electronically Signed   By: Leanna Battles M.D.   On: 05/11/2014 15:08   Ct Head Wo Contrast  04/20/2014   CLINICAL DATA:  Seizure activity for 1 atherosclerosis seizure activity for 1 hour. Hypertension.  EXAM: CT HEAD WITHOUT CONTRAST  TECHNIQUE: Contiguous axial images were obtained from the base of the skull through the vertex without intravenous contrast.  COMPARISON:  Head CT scan 01/19/2014.  FINDINGS: Cortical atrophy and chronic microvascular ischemic change are identified. Remote infarction in the left thalamus is noted. No evidence of acute abnormality including infarct, hemorrhage, mass lesion, mass effect, midline shift or abnormal extra-axial fluid collection is seen. No hydrocephalus or pneumocephalus. The calvarium is intact.  IMPRESSION: No acute abnormality.  Atrophy, chronic microvascular ischemic change remote left thalamic infarct.   Electronically Signed   By: Drusilla Kanner M.D.   On: 04/20/2014 20:32   Dg Chest Portable 1 View  04/20/2014   CLINICAL DATA:  Initial evaluation for seizure today with history of hypertension and congestive heart failure  EXAM: PORTABLE CHEST - 1 VIEW  COMPARISON:  09/19/2013  FINDINGS: Mild cardiac enlargement. Vascular pattern normal. Single lead defibrillator stable. Lungs clear.  IMPRESSION: No active disease.   Electronically Signed   By: Esperanza Heir M.D.    On: 04/20/2014 20:01     EKG: Sinus bradycardia with left axis deviation right bundle branch block unchanged from previous EKG   Assessment & Plan  Principal Problem:   Hypotension -Admit to telemetry -Currently has resolved after appropriate fluid challenges -Suspect combination of volume depletion in setting of ongoing use of antihypertensive and diuretics has contributed -Hold offending medications -Initial lactic acid was 2.6 and now down to 1.64 which is suggestive of volume depletional now that lactic acid has corrected with hydration and improved blood pressure -Patient did have mild leukocytosis but likely a reactant related to low perfusion from hypotension at presentation -Urinalysis is negative as is chest x-ray so no source of infection at this point -Old blood and urine cultures obtained in the ER  Active Problems:   Seizure disorder -If reports patient had been presenting on a regular basis to the ER because of apparent seizure activity and since 3/17 he has been having a seizure almost daily; reported to the neurologist last seizure was 2 days prior -Hip and seen by his primary care physician on 3/30 and Depakote was switched to Keppra which his wife has not done -Will proceed with discontinuing Depakote in favor of Keppra as was previously recommended -Check serum levels of both    FTT (failure to thrive) in adult/volume depletion -Multifactorial in etiology related to recurrent seizure activity, likely recurrent hypotension from antihypertensive medications and diuretics, recurrent hypoglycemia and bradycardia noted today -  Discussed with wife and she wishes to continue to care for the patient at home reporting he did not wish to ever be placed in a nursing facility; wife has appropriate equipment at home and help at the house -She expresses HE is a DO NOT RESUSCITATE and reports that he does have poor quality of life -Continuous IV fluids -Patient with known dysphagia  but intolerance to thickened liquids-SLP evaluation -Dysphagia 2 diet    Diabetes mellitus, type 2 -Wife reports patient with variable oral intake with associated recurrent hypoglycemia -Have stopped long-acting insulin -We'll utilize sliding scale insulin while here-suggested to wife this may be appropriate glycemic control after discharge as well    HYPERTENSION, BENIGN ESSENTIAL -Current blood pressure soft in all antihypertensive medications have been stopped    Anemia -Hemoglobin stable and at baseline    Bradycardia/hypothermia -Discontinue carvedilol -Suspect hypothermia from hypotension -Blood pressure has rebounded after fluids -If hypotension and/or hypothermia recur recommend check cortisol level    CAD (coronary artery disease) -No apparent symptoms -Up on and has been negative 2 since arrival    Chronic systolic congestive heart failure, NYHA class 1 -Currently compensated -Repeat echocardiogram since last done 2010-this information may be helpful regarding adjustment of antihypertensive medications and diuretics and in making decisions about end-of-life care    Thrombocytopenia -Suspect related to recent low perfusion and hypotension -Repeat CBC in a.m. -If platelets continue to drop discontinue heparin for DVT prophylaxis    DVT Prophylaxis: Subcutaneous heparin  Family Communication:  Wife at bedside   Code Status:  DO NOT RESUSCITATE  Condition:  Stable  Time spent in minutes : 60   Tosha Belgarde L. ANP on 05/11/2014 at 4:22 PM  Between 7am to 7pm - Pager - [phone]  After 7pm go to [email or web address] - password [email or web address]  And look for the night coverage person covering me after hours  [facility]

## 2014-05-11 NOTE — ED Notes (Signed)
Pt was sent here from MD office with hypotension 70/40, report seizure like activity and has history of seizures.  Pt has been sleepy for almost a week.  Pt has had depakote increased.  Pt is very weak adn pale.  Pt is usually one person assist.

## 2014-05-11 NOTE — Progress Notes (Signed)
Pt on room air spo2 98%. RT will continue to monitor.

## 2014-05-11 NOTE — ED Notes (Signed)
NOTIFIED DR. Denton LankSTEINL FOR PATIENTS LAB RESULTS OF CG4+LACTIC ACID@15 :00PM.

## 2014-05-11 NOTE — Patient Instructions (Signed)
1. Stat EEG 2. ER evaluation for significant lethargy 3. Follow-up in 2 months

## 2014-05-11 NOTE — ED Notes (Signed)
Attempted report 

## 2014-05-11 NOTE — Progress Notes (Signed)
Hypoglycemic Event  CBG: 59  Treatment: 3 glucose tabs  Symptoms: None  Follow-up CBG: Time:10:12pm CBG Result:65  Possible Reasons for Event: Inadequate meal intake   Treatment: Dextrose/D50  CBG result: 156  Comments/MD notified:    Texie Tupou M  Remember to initiate Hypoglycemia Order Set & complete

## 2014-05-11 NOTE — ED Provider Notes (Signed)
CSN: [id number]     Arrival date & time 05/11/14  1306 History   First MD Initiated Contact with Patient 05/11/14 1347     Chief Complaint  Patient presents with  . Seizures  . 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  Per the patients wife he hasn't been eating as well for the past two weeks.  Drinking approx 30ml of fluid daily, on lasix.  He has also been more tired and less active for the past two weeks.  No infectious sx, no fever/cough/diarrhea/vomiting.  He went to clinic today for a reg appointment and was noted to be hypotensive in the 60-70's systolic so he was sent to the ED.  He denies CP/SOB/abdominal pain/headache.  Past Medical History  Diagnosis Date  . 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 Automatic implantable cardiac defibrillator in situ   . Congestive heart failure, unspecified   . Retention of urine, unspecified   . Pneumonia, organism unspecified   . Cough   . Acute bronchitis   . Contusion of foot   . Other sign and symptom in breast   . Fibromyalgia   . Hyperlipidemia   . Coronary atherosclerosis of unspecified type of vessel, native or graft   . Esophageal reflux   . Type I (juvenile type) diabetes mellitus without mention of complication, uncontrolled   . Essential hypertension, benign   . Cocaine abuse   . Alcohol abuse   . Microalbuminuria   . GERD (gastroesophageal reflux disease)   . Arteriovenous malformation (AVM)     of colon  . Diverticulosis   . GI bleed     secondary to avm  . Umbilical hernia   . 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 History of oxygen administration     at bedtime uses oxygen 2l/m  nasally.   Past Surgical History  Procedure Laterality Date  . Back surgery      X 2  . Tonsillectomy    . Cardiac defibrillator placement    . Colonoscopy with propofol N/A 12/23/2013    Procedure: COLONOSCOPY WITH PROPOFOL;  Surgeon: Hart Carwinora M Brodie, MD;  Location: [facility];  Service: Endoscopy;  Laterality: N/A;  . Implantable cardioverter defibrillator (icd) generator change N/A 01/01/2012    Procedure: ICD GENERATOR CHANGE;  Surgeon: Marinus MawGregg W Taylor, MD;  [facility];  Service: Cardiovascular;  Laterality: N/A;   Family History  Problem Relation Age of Onset  . Heart failure Mother   . Diabetes Sister   . Colon cancer Brother     questionable  . Prostate cancer Neg Hx    History  Substance Use Topics  . Smoking status: Former Smoker -- 2.00 packs/day for 55 years    Types: Cigarettes  . Smokeless tobacco: Never Used  . Alcohol Use: No     Comment: quit at the time of stroke    Review of Systems  Constitutional: Negative for fever and chills.  Eyes: Negative for redness.  Respiratory: Negative for cough and shortness of breath.   Cardiovascular: Negative for chest pain.  Gastrointestinal: Negative for nausea, vomiting, abdominal pain and diarrhea.  Genitourinary: Negative for dysuria.  Skin: Negative for rash.  Neurological: Positive  for weakness. Negative for headaches.  All other systems reviewed and are negative.     Allergies  Metformin  Home Medications   Prior to Admission medications   Medication Sig Start Date End Date Taking? Authorizing Provider  aspirin EC 81 MG tablet Take 81 mg by mouth daily.    Historical Provider, MD  carvedilol (COREG) 25 MG tablet Take 0.5 tablets (12.5 mg total) by mouth every evening. 02/19/14   Corwin Levins, MD  divalproex (DEPAKOTE) 250 MG DR tablet 250 mg. Take 2 tablets twice daily 04/21/14   Historical Provider, MD  divalproex (DEPAKOTE) 500 MG DR tablet 500 mg. Take 1 tablet twice daily 04/15/14   Historical  Provider, MD  finasteride (PROSCAR) 5 MG tablet Take 1 tablet (5 mg total) by mouth daily with breakfast. 02/19/14   Corwin Levins, MD  furosemide (LASIX) 40 MG tablet Take 1 tablet (40 mg total) by mouth daily with breakfast. 02/19/14   Corwin Levins, MD  insulin NPH Human (HUMULIN N,NOVOLIN N) 100 UNIT/ML injection Inject 10 Units into the skin daily before breakfast.    Historical Provider, MD  levETIRAcetam (KEPPRA) 500 MG tablet Take 1 tablet (500 mg total) by mouth 2 (two) times daily. Patient not taking: Reported on 05/11/2014 05/06/14   Corwin Levins, MD  loperamide (IMODIUM) 1 MG/5ML solution Take 6 mg by mouth as needed for diarrhea or loose stools (TAKES AS NEEDED).     Historical Provider, MD  potassium chloride SA (K-DUR,KLOR-CON) 20 MEQ tablet Take 1 tablet (20 mEq total) by mouth 2 (two) times daily. 02/19/14   Corwin Levins, MD  sertraline (ZOLOFT) 50 MG tablet Take 1 tablet (50 mg total) by mouth daily with breakfast. 02/19/14   Corwin Levins, MD  simvastatin (ZOCOR) 40 MG tablet Take 1 tablet (40 mg total) by mouth every evening. 02/19/14   Corwin Levins, MD  tamsulosin (FLOMAX) 0.4 MG CAPS capsule Take 1 capsule (0.4 mg total) by mouth daily after supper. 02/19/14   Corwin Levins, MD  valsartan (DIOVAN) 320 MG tablet Take 0.5 tablets (160 mg total) by mouth daily with breakfast. 02/19/14   Corwin Levins, MD   BP 158/54 mmHg  Pulse 51  Temp(Src) 96.5 F (35.8 C) (Rectal)  Resp 14  SpO2 94% Physical Exam  Constitutional: He is oriented to person, place, and time. No distress.  HENT:  Head: Normocephalic and atraumatic.  Eyes: EOM are normal. Pupils are equal, round, and reactive to light.  Neck: Normal range of motion. Neck supple.  Cardiovascular: Normal rate.   Pulmonary/Chest: Effort normal. No respiratory distress.  Abdominal: Soft. There is no tenderness.  Musculoskeletal: Normal range of motion.  Neurological: He is alert and oriented to person, place, and time. He is not  disoriented. No cranial nerve deficit or sensory deficit.  R sided weakness.  Skin: No rash noted. He is not diaphoretic.  Psychiatric: He has a normal mood and affect.    ED Course  Procedures (including critical care time) Labs Review Labs Reviewed  CBC WITH DIFFERENTIAL/PLATELET - Abnormal; Notable for the following:    WBC 11.0 (*)    RBC 3.06 (*)    Hemoglobin 9.8 (*)    HCT 31.5 (*)    MCV 102.9 (*)    Platelets 100 (*)    Neutro Abs 7.8 (*)    Monocytes Relative 14 (*)    Monocytes Absolute 1.6 (*)    All other components  within normal limits  COMPREHENSIVE METABOLIC PANEL - Abnormal; Notable for the following:    Glucose, Bld 127 (*)    BUN 33 (*)    Albumin 2.7 (*)    GFR calc non Af Amer 51 (*)    GFR calc Af Amer 60 (*)    All other components within normal limits  BRAIN NATRIURETIC PEPTIDE - Abnormal; Notable for the following:    B Natriuretic Peptide 173.2 (*)    All other components within normal limits  RETICULOCYTES - Abnormal; Notable for the following:    RBC. 2.84 (*)    All other components within normal limits  CBG MONITORING, ED - Abnormal; Notable for the following:    Glucose-Capillary 131 (*)    All other components within normal limits  CBG MONITORING, ED - Abnormal; Notable for the following:    Glucose-Capillary 106 (*)    All other components within normal limits  I-STAT CG4 LACTIC ACID, ED - Abnormal; Notable for the following:    Lactic Acid, Venous 2.60 (*)    All other components within normal limits  I-STAT VENOUS BLOOD GAS, ED - Abnormal; Notable for the following:    pH, Ven 7.342 (*)    Bicarbonate 26.1 (*)    All other components within normal limits  URINE CULTURE  CULTURE, BLOOD (ROUTINE X 2)  CULTURE, BLOOD (ROUTINE X 2)  URINALYSIS, ROUTINE W REFLEX MICROSCOPIC  VALPROIC ACID LEVEL  LEVETIRACETAM LEVEL  TSH  VITAMIN B12  FOLATE  IRON AND TIBC  FERRITIN  HEMOGLOBIN A1C  I-STAT TROPOININ, ED  I-STAT CG4 LACTIC ACID,  ED  I-STAT TROPOININ, ED  I-STAT CG4 LACTIC ACID, ED  I-STAT TROPOININ, ED  I-STAT CG4 LACTIC ACID, ED  I-STAT CG4 LACTIC ACID, ED    Imaging Review Dg Chest 1 View  05/11/2014   CLINICAL DATA:  Seizures, altered mental status.  EXAM: CHEST  1 VIEW  COMPARISON:  04/20/2014.  FINDINGS: Trachea is midline. Heart size normal. Lungs are clear. No pleural fluid. ICD lead tip projects over the right ventricle.  IMPRESSION: No acute findings.   Electronically Signed   By: Leanna Battles M.D.   On: 05/11/2014 15:08     EKG Interpretation   Date/Time:  Monday May 11 2014 13:34:21 EDT Ventricular Rate:  64 PR Interval:  202 QRS Duration: 134 QT Interval:  436 QTC Calculation: 449 R Axis:   -75 Text Interpretation:  Sinus rhythm with occasional ventricular-paced  complexes and Premature atrial complexes Left axis deviation Right bundle  branch block T wave abnormality, consider lateral ischemia `st dep/t inv  inf and lat more pronounced than prior Confirmed by Denton Lank  MD, Caryn Bee  ([id number]) on 05/11/2014 3:16:38 PM      MDM   Final diagnoses:  Altered mental status, unspecified altered mental status type  Weakness  Elevated lactic acid level  Dehydration  Bradycardia  Hypotension, unspecified hypotension type    74 y/o male with PMH seizures, CAD, cocaine abuse, ethanol abuse, 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  Sent from clinic today for hypotension.  Hx sig for decreased PO intake (30oz fluid daily)  Exam as above, patient hypotensive in the 70's.  Will start fluids.  bp responded nicely to 1.5L fluid with normal bp's now. Exam otherwise unremarkable.  No fever/tachycardia/infectious sx.  Doubt sepsis.  Feel dehydration more likely, especially since he had such profound improvement with small amount of fluid.  If he had sepsis I  would expect more persistent hypotension.  Basic labs obtained, trop, ua/ucx/bloodcx/cxr/ekg.  Will hold off on abx for  now.  Patient continues to do well.  UA clean, CXR w/o infiltrate.  Patient mentating at his baseline for me w/ baseline neuro exam.  Will admit to medicine for hypotension.     Silas Flood, MD 05/11/14 1911  Cathren Laine, MD 05/12/14 [phone]
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
## 2014-05-11 NOTE — ED Notes (Signed)
PT placed in gown and in bed. Pt monitored by pulse ox, bp cuff, and 12-lead. 

## 2014-05-11 NOTE — Procedures (Signed)
ELECTROENCEPHALOGRAM REPORT  Date of Study: 05/11/2014  Patient's Name: [name]: [id number] Date of Birth: April 02, 1940  Referring Provider: Dr. Patrcia DollyKaren Yaritsa Savarino  Clinical History: This is a 74 year old man with seizures, stat EEG done in the office due to significant lethargy. Wife reports near-daily seizures, last seizure staring and drooling was 2 days ago. EEG to assess for subclinical seizures.   Medications: Depakote, aspirin, Diovan, Coreg, Lasix, Proscar  Technical Summary: A multichannel digital EEG recording measured by the international 10-20 system with electrodes applied with paste and impedances below 5000 ohms performed as portable with EKG monitoring in a predominantly drowsy and asleep patient.  Hyperventilation was not performed. Photic stimulation was performed.  The digital EEG was referentially recorded, reformatted, and digitally filtered in a variety of bipolar and referential montages for optimal display.   Description: The patient is predominantly drowsy and asleep during the recording.  He is noted to be lethargic and only follows commands intermittently. There is no clear posterior dominant rhythm. The background consists of a large amount of diffuse 5-6 Hz theta slowing and occasional 2-3 Hz delta slowing. There is a single broad sharp transient seen over the left temporal region, of unclear clinical significance. Normal sleep architecture is not seen. Photic stimulation did not elicit any abnormalities.  There were no clear epileptiform discharges or electrographic seizures seen.    EKG lead showed irregular rhythm.  Impression: This predominantly drowsy and asleep EEG is abnormal due to moderate diffuse slowing of the background. There is a single broad sharp transient over the left temporal region, of unclear clinical significance. There were no electrographic seizures in this study.  Clinical Correlation of the above findings indicates diffuse cerebral  dysfunction that is non-specific in etiology and can be seen with hypoxic/ischemic injury, toxic/metabolic encephalopathies, or medication effect.  The absence of epileptiform discharges does not rule out a clinical diagnosis of epilepsy.  Clinical correlation is advised.   Patrcia DollyKaren Skylor Schnapp, M.D.

## 2014-05-11 NOTE — ED Notes (Signed)
IN and out cath performed by EMT Apolinar JunesBrandon. Sterile technique observed by RN GrenadaBrittany.

## 2014-05-12 DIAGNOSIS — R404 Transient alteration of awareness: Secondary | ICD-10-CM

## 2014-05-12 DIAGNOSIS — E860 Dehydration: Secondary | ICD-10-CM

## 2014-05-12 DIAGNOSIS — R4182 Altered mental status, unspecified: Secondary | ICD-10-CM | POA: Insufficient documentation

## 2014-05-12 DIAGNOSIS — I509 Heart failure, unspecified: Secondary | ICD-10-CM

## 2014-05-12 DIAGNOSIS — I959 Hypotension, unspecified: Principal | ICD-10-CM

## 2014-05-12 LAB — CBC
HCT: 28.4 % — ABNORMAL LOW (ref 39.0–52.0)
Hemoglobin: 9.1 g/dL — ABNORMAL LOW (ref 13.0–17.0)
MCH: 32.5 pg (ref 26.0–34.0)
MCHC: 32 g/dL (ref 30.0–36.0)
MCV: 101.4 fL — AB (ref 78.0–100.0)
Platelets: 96 10*3/uL — ABNORMAL LOW (ref 150–400)
RBC: 2.8 MIL/uL — AB (ref 4.22–5.81)
RDW: 13.4 % (ref 11.5–15.5)
WBC: 11.7 10*3/uL — ABNORMAL HIGH (ref 4.0–10.5)

## 2014-05-12 LAB — IRON AND TIBC
Iron: 58 ug/dL (ref 42–165)
Saturation Ratios: 22 % (ref 20–55)
TIBC: 269 ug/dL (ref 215–435)
UIBC: 211 ug/dL (ref 125–400)

## 2014-05-12 LAB — BASIC METABOLIC PANEL
ANION GAP: 5 (ref 5–15)
BUN: 26 mg/dL — ABNORMAL HIGH (ref 6–23)
CALCIUM: 8.1 mg/dL — AB (ref 8.4–10.5)
CO2: 29 mmol/L (ref 19–32)
Chloride: 110 mmol/L (ref 96–112)
Creatinine, Ser: 0.82 mg/dL (ref 0.50–1.35)
GFR calc Af Amer: >90 mL/min (ref >90)
GFR, EST NON AFRICAN AMERICAN: 85 mL/min — AB (ref >90)
Glucose, Bld: 108 mg/dL — ABNORMAL HIGH (ref 70–99)
POTASSIUM: 4.7 mmol/L (ref 3.5–5.1)
SODIUM: 144 mmol/L (ref 135–145)

## 2014-05-12 LAB — URINE CULTURE
CULTURE: NO GROWTH
Colony Count: NO GROWTH

## 2014-05-12 LAB — HEPATITIS C ANTIBODY: HCV Ab: NEGATIVE

## 2014-05-12 LAB — VITAMIN B12
VITAMIN B 12: 1751 pg/mL — AB (ref 211–911)
Vitamin B-12: >2000 pg/mL — ABNORMAL HIGH (ref 211–911)

## 2014-05-12 LAB — FOLATE: Folate: 11.1 ng/mL

## 2014-05-12 LAB — T4, FREE: Free T4: 0.42 ng/dL — ABNORMAL LOW (ref 0.80–1.80)

## 2014-05-12 LAB — PROTIME-INR
INR: 1.23 (ref 0.00–1.49)
Prothrombin Time: 15.6 seconds — ABNORMAL HIGH (ref 11.6–15.2)

## 2014-05-12 LAB — GLUCOSE, CAPILLARY
Glucose-Capillary: 118 mg/dL — ABNORMAL HIGH (ref 70–99)
Glucose-Capillary: 141 mg/dL — ABNORMAL HIGH (ref 70–99)
Glucose-Capillary: 160 mg/dL — ABNORMAL HIGH (ref 70–99)
Glucose-Capillary: 110 mg/dL — ABNORMAL HIGH (ref 70–99)

## 2014-05-12 LAB — FERRITIN: Ferritin: 540 ng/mL — ABNORMAL HIGH (ref 22–322)

## 2014-05-12 LAB — AMMONIA: Ammonia: 29 umol/L (ref 11–32)

## 2014-05-12 LAB — APTT: aPTT: 32 seconds (ref 24–37)

## 2014-05-12 LAB — HEPATITIS B SURFACE ANTIGEN: Hepatitis B Surface Ag: NEGATIVE

## 2014-05-12 MED ORDER — PERFLUTREN LIPID MICROSPHERE
1.0000 mL | INTRAVENOUS | Status: AC | PRN
  Administered 2014-05-12: 2 mL via INTRAVENOUS
  Filled 2014-05-12: qty 10

## 2014-05-12 MED ORDER — SODIUM CHLORIDE 0.9 % IV SOLN
1500.0000 mg | Freq: Once | INTRAVENOUS | Status: AC
  Administered 2014-05-12: 1500 mg via INTRAVENOUS
  Filled 2014-05-12: qty 1500

## 2014-05-12 MED ORDER — VANCOMYCIN HCL IN DEXTROSE 1-5 GM/200ML-% IV SOLN
1000.0000 mg | Freq: Two times a day (BID) | INTRAVENOUS | Status: DC
  Administered 2014-05-13: 1000 mg via INTRAVENOUS
  Filled 2014-05-12 (×2): qty 200

## 2014-05-12 MED ORDER — HYDRALAZINE HCL 20 MG/ML IJ SOLN: 5.0000 mg | Freq: Four times a day (QID) | INTRAMUSCULAR | Status: DC | PRN

## 2014-05-12 MED ORDER — OXCARBAZEPINE 150 MG PO TABS
150.0000 mg | ORAL_TABLET | Freq: Two times a day (BID) | ORAL | Status: DC
  Administered 2014-05-12 – 2014-05-13 (×3): 150 mg via ORAL
  Filled 2014-05-12 (×5): qty 1

## 2014-05-12 MED ORDER — OXCARBAZEPINE 300 MG PO TABS: 300.0000 mg | ORAL_TABLET | Freq: Two times a day (BID) | ORAL | Status: DC

## 2014-05-12 NOTE — Progress Notes (Addendum)
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 Apparently the patient also had a heart rate of 61 and he was lethargic. He was sent to the emergency department where he received intravenous fluids with improvement of his blood pressure. The patient has been to the emergency department on 04/21/2014 and 04/26/2014 for seizures. Head CT did not show any acute changes. On both visits, he was noted to return to baseline, awake, alert, and cooperative, discharged home on higher dose Depakote  BID. The patient's wife reports that the patient has been having a seizure almost daily, last seizure 2 days prior to this admission. According to the medical record, the patient has an intolerance to Keppra which caused him to be drowsy.  His wife reports that he has had a functional decline since his seizures in November 2014. His seizures are usually characterized by drooling, staring and unresponsive with his eyes open "in a dead stare" for 10-15 minutes.  EEG in office yesterday showed moderate slowing with a single temporal sharp wave. In addition, the patient has had reported variable oral intake resulting in hypoglycemia. The patient's wife has had to adjust patient's insulin to avoid further episodes Assessment/Plan: Hypotension -Likely due to volume depletion -Responded nicely to IV fluids -Follow up blood cultures -Urinalysis negative for pyuria -Chest x-ray negative for infiltrates -Lactic acid 2.6---> 1.48 -Remain off of antibiotics and observe -Continue IV fluids Seizure disorder -VPA level was 75.9 on 05/11/14 -consult  neurology--spoke with Dr. Amada Jupiter -VPA was stopped 4/4 -continue keppra until seen by Dr. Amada Jupiter who plans to start trileptal -Last seizure 2 days prior to admission Failure to thrive -Nutrition consult -Wife does not want patient to be placed in nursing facility -She expresses HE is a DO NOT RESUSCITATE and reports that he does have poor quality of life -Continuous IV fluids -Patient with known dysphagia but intolerance to thickened liquids-SLP evaluation -Dysphagia 2 diet Diabetes mellitus type 2 -Check hemoglobin A1c -Sensitive NovoLog sliding scale -Hold NPH which pt was receiving at home Bradycardia -Discontinue carvedilol and monitor on telemetry Hypothermia -am cortisol -seems to have improved -TSH--5.248 -check free T4 Coronary artery disease -Continue aspirin 81 mg daily -No chest pain at this time Ischemic cardiomyopathy -07/07/2009 echocardiogram shows EF 40-45 percent, diffuse HK -clinically compensated -Repeat echocardiogram since last done 2010-this information may be helpful regarding adjustment of antihypertensive medications and diuretics and in making decisions about end-of-life care -s/p AICD Thrombocytopenia -Serum B12 -HIV -Check PT/INR/PTT -Hepatitis B surface antigen, E antibody   Family Communication:   No family at beside Disposition Plan:   Home when medically stable       Procedures/Studies: Dg Chest 1 View  05/11/2014   CLINICAL DATA:  Seizures, altered mental status.  EXAM: CHEST  1 VIEW  COMPARISON:  04/20/2014.  FINDINGS: Trachea is midline. Heart size normal. Lungs are clear. No pleural fluid. ICD lead tip projects over the right ventricle.  IMPRESSION: No acute findings.   Electronically Signed   By: Leanna Battles M.D.   On: 05/11/2014 15:08   Ct Head Wo Contrast  04/20/2014   CLINICAL DATA:  Seizure activity for  1 atherosclerosis seizure activity for 1 hour. Hypertension.  EXAM: CT HEAD WITHOUT CONTRAST  TECHNIQUE:  Contiguous axial images were obtained from the base of the skull through the vertex without intravenous contrast.  COMPARISON:  Head CT scan 01/19/2014.  FINDINGS: Cortical atrophy and chronic microvascular ischemic change are identified. Remote infarction in the left thalamus is noted. No evidence of acute abnormality including infarct, hemorrhage, mass lesion, mass effect, midline shift or abnormal extra-axial fluid collection is seen. No hydrocephalus or pneumocephalus. The calvarium is intact.  IMPRESSION: No acute abnormality.  Atrophy, chronic microvascular ischemic change remote left thalamic infarct.   Electronically Signed   By: Drusilla Kanner M.D.   On: 04/20/2014 20:32   Dg Chest Portable 1 View  04/20/2014   CLINICAL DATA:  Initial evaluation for seizure today with history of hypertension and congestive heart failure  EXAM: PORTABLE CHEST - 1 VIEW  COMPARISON:  09/19/2013  FINDINGS: Mild cardiac enlargement. Vascular pattern normal. Single lead defibrillator stable. Lungs clear.  IMPRESSION: No active disease.   Electronically Signed   By: Esperanza Heir M.D.   On: 04/20/2014 20:01         Subjective: Patient denies fevers, chills, headache, chest pain, dyspnea, nausea, vomiting, diarrhea, abdominal pain, dysuria   Objective: Filed Vitals:   05/11/14 1825 05/11/14 1835 05/11/14 1919 05/12/14 0503  BP: 143/82 158/54 154/56   Pulse: 138 51 62   Temp:   97.5 F (36.4 C)   TempSrc:   Axillary   Resp: 20 14    Weight:    80.377 kg (177 lb 3.2 oz)  SpO2: 97% 94% 99%     Intake/Output Summary (Last 24 hours) at 05/12/14 0929 Last data filed at 05/12/14 0524  Gross per 24 hour  Intake 2663.33 ml  Output    300 ml  Net 2363.33 ml   Weight change:  Exam:   General:  Pt is alert, follows commands appropriately, not in acute distress  HEENT: No icterus, No thrush, [place]/AT  Cardiovascular: RRR, S1/S2, no rubs, no gallops  Respiratory: Poor inspiratory effort but clear to  auscultation  Abdomen: Soft/+BS, non tender, non distended, no guarding  Extremities: 1+LE edema, No lymphangitis, No petechiae, No rashes, no synovitis  Data Reviewed: Basic Metabolic Panel:  Recent Labs Lab 05/11/14 1350  NA 141  K 5.0  CL 103  CO2 29  GLUCOSE 127*  BUN 33*  CREATININE 1.32  CALCIUM 8.6   Liver Function Tests:  Recent Labs Lab 05/11/14 1350  AST 18  ALT 10  ALKPHOS 64  BILITOT 0.7  PROT 7.4  ALBUMIN 2.7*   No results for input(s): LIPASE, AMYLASE in the last 168 hours. No results for input(s): AMMONIA in the last 168 hours. CBC:  Recent Labs Lab 05/11/14 1350 05/12/14 0743  WBC 11.0* 11.7*  NEUTROABS 7.8*  --   HGB 9.8* 9.1*  HCT 31.5* 28.4*  MCV 102.9* 101.4*  PLT 100* 96*   Cardiac Enzymes: No results for input(s): CKTOTAL, CKMB, CKMBINDEX, TROPONINI in the last 168 hours. BNP: Invalid input(s): POCBNP CBG:  Recent Labs Lab 05/11/14 1619 05/11/14 2152 05/11/14 2227 05/11/14 2254 05/12/14 0644  GLUCAP 106* 59* 65* 156* 118*    Recent Results (from the past 240 hour(s))  Blood culture (routine x 2)     Status: None (Preliminary result)   Collection Time: 05/11/14  2:22 PM  Result Value Ref Range Status   Specimen Description BLOOD LEFT HAND  Final   Special  Requests BOTTLES DRAWN AEROBIC ONLY 5ML  Final   Culture   Final           BLOOD CULTURE RECEIVED NO GROWTH TO DATE CULTURE WILL BE HELD FOR 5 DAYS BEFORE ISSUING A FINAL NEGATIVE REPORT Performed at [facility]    Report Status PENDING  Incomplete     Scheduled Meds: . aspirin EC  81 mg Oral Daily  . finasteride  5 mg Oral Q breakfast  . heparin  5,000 Units Subcutaneous 3 times per day  . insulin aspart  0-5 Units Subcutaneous QHS  . insulin aspart  0-9 Units Subcutaneous TID WC  . levETIRAcetam  500 mg Oral BID  . sertraline  50 mg Oral Q breakfast  . simvastatin  40 mg Oral QPM  . tamsulosin  0.4 mg Oral QPC supper   Continuous Infusions: .  sodium chloride 100 mL/hr at 05/12/14 0321     Caoimhe Damron, DO  [facility] Pager [phone]  If 7PM-7AM, please contact night-coverage [email or web address] Password [email or web address] 05/12/2014, 9:29 AM   LOS: 1 day

## 2014-05-12 NOTE — Progress Notes (Signed)
ANTIBIOTIC CONSULT NOTE - INITIAL  Pharmacy Consult for vancomycin Indication: bacteremia  Allergies  Allergen Reactions  . Metformin Diarrhea  . Keppra [Levetiracetam] Other (See Comments)    Makes him sleep all the time    Patient Measurements: Weight: 177 lb 3.2 oz (80.377 kg) Adjusted Body Weight:   Vital Signs: Temp: 98.7 F (37.1 C) (04/05 2021) Temp Source: Oral (04/05 2021) BP: 173/61 mmHg (04/05 2021) Pulse Rate: 70 (04/05 2021) Intake/Output from previous day: 04/04 0701 - 04/05 0700 In: 2663.3 [I.V.:2663.3] Out: 300 [Urine:300] Intake/Output from this shift:    Labs:  Recent Labs  05/11/14 1350 05/12/14 0743  WBC 11.0* 11.7*  HGB 9.8* 9.1*  PLT 100* 96*  CREATININE 1.32 0.82   Estimated Creatinine Clearance: 78.7 mL/min (by C-G formula based on Cr of 0.82). No results for input(s): VANCOTROUGH, VANCOPEAK, VANCORANDOM, GENTTROUGH, GENTPEAK, GENTRANDOM, TOBRATROUGH, TOBRAPEAK, TOBRARND, AMIKACINPEAK, AMIKACINTROU, AMIKACIN in the last 72 hours.   Microbiology: Recent Results (from the past 720 hour(s))  Blood culture (routine x 2)     Status: None (Preliminary result)   Collection Time: 05/11/14  2:22 PM  Result Value Ref Range Status   Specimen Description BLOOD LEFT HAND  Final   Special Requests BOTTLES DRAWN AEROBIC ONLY 5ML  Final   Culture   Final    GRAM POSITIVE COCCI IN CLUSTERS Note: Gram Stain Report Called to,Read Back By and Verified With: ANTONIQUE BOWEN AT 9:53 P.M. ON 05/12/2014 WARRB Performed at [facility]    Report Status PENDING  Incomplete  Urine culture     Status: None   Collection Time: 05/11/14  2:35 PM  Result Value Ref Range Status   Specimen Description URINE, RANDOM  Final   Special Requests NONE  Final   Colony Count NO GROWTH Performed at [facility]   Final   Culture NO GROWTH Performed at [facility]   Final   Report Status 05/12/2014 FINAL  Final    Medical History: Past Medical  History  Diagnosis Date  . 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 Automatic implantable cardiac defibrillator in situ   . Congestive heart failure, unspecified   . Retention of urine, unspecified   . Pneumonia, organism unspecified   . Cough   . Acute bronchitis   . Contusion of foot   . Other sign and symptom in breast   . Fibromyalgia   . Hyperlipidemia   . Coronary atherosclerosis of unspecified type of vessel, native or graft   . Esophageal reflux   . Type I (juvenile type) diabetes mellitus without mention of complication, uncontrolled   . Essential hypertension, benign   . Cocaine abuse   . Alcohol abuse   . Microalbuminuria   . GERD (gastroesophageal reflux disease)   . Arteriovenous malformation (AVM)     of colon  . Diverticulosis   . GI bleed     secondary to avm  . Umbilical hernia   . 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 History of oxygen administration     at bedtime uses oxygen 2l/m nasally.    Medications:  Anti-infectives    Start     Dose/Rate Route Frequency Ordered Stop   05/13/14 1100  vancomycin (VANCOCIN) IVPB 1000 mg/200 mL premix     1,000 mg 200 mL/hr over 60 Minutes Intravenous Every 12 hours 05/12/14 2219  05/12/14 2300  vancomycin (VANCOCIN) 1,500 mg in sodium chloride 0.9 % 500 mL IVPB     1,500 mg 250 mL/hr over 120 Minutes Intravenous  Once 05/12/14 2219       Assessment: Pt admitted with seizure disorder, lethargy and hypotension. 1/2 blood cultures are growing GPC - will start vancomycin. Scr < 1, eCrCl 75-80 ml/min, mild leukocytosis.   Goal of Therapy:  Vancomycin trough level 15-20 mcg/ml  Plan:  Vancomycin 1500 mg IV x1, then 1 g IV q12h Monitor renal fx, cultures + susceptibilities, duration of therapy, VT as indicated   Agapito Games, PharmD, BCPS Clinical Pharmacist Pager: [phone] 05/12/2014 10:20 PM

## 2014-05-12 NOTE — Progress Notes (Signed)
Echocardiogram 2D Echocardiogram with Definity has been performed.  Darragh Nay 05/12/2014, 11:18 AM

## 2014-05-12 NOTE — Evaluation (Signed)
Clinical/Bedside Swallow Evaluation Patient Details  Name: [name] MRN: [id number] Date of Birth: [birth year]  Today's Date: 05/12/2014 Time: SLP Start Time (ACUTE ONLY): 1424 SLP Stop Time (ACUTE ONLY): 1440 SLP Time Calculation (min) (ACUTE ONLY): 16 min  Past Medical History:  Past Medical History  Diagnosis Date  . 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 Automatic implantable cardiac defibrillator in situ   . Congestive heart failure, unspecified   . Retention of urine, unspecified   . Pneumonia, organism unspecified   . Cough   . Acute bronchitis   . Contusion of foot   . Other sign and symptom in breast   . Fibromyalgia   . Hyperlipidemia   . Coronary atherosclerosis of unspecified type of vessel, native or graft   . Esophageal reflux   . Type I (juvenile type) diabetes mellitus without mention of complication, uncontrolled   . Essential hypertension, benign   . Cocaine abuse   . Alcohol abuse   . Microalbuminuria   . GERD (gastroesophageal reflux disease)   . Arteriovenous malformation (AVM)     of colon  . Diverticulosis   . GI bleed     secondary to avm  . Umbilical hernia   . 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 History of oxygen administration     at bedtime uses oxygen 2l/m nasally.   Past Surgical History:  Past Surgical History  Procedure Laterality Date  . Back surgery      X 2  . Tonsillectomy    . Cardiac defibrillator placement    . Colonoscopy with propofol N/A 12/23/2013    Procedure: COLONOSCOPY WITH PROPOFOL;  Surgeon: Hart Carwin, MD;  Location: [facility] ENDOSCOPY;  Service: Endoscopy;  Laterality: N/A;  . 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 Previous clinical swallow evaluation 12/2012 recommending regular diet and thin liquids.   Assessment / Plan / Recommendation Clinical Impression  Pt presents without overt signs of aspiration across consistencies tested. Mastication is slow and likely baseline, given condition of dentition. Recommend Dys 3 diet and thin liquids with no acute SLP needs identified.     Aspiration Risk  Mild    Diet Recommendation Dysphagia 3 (Mechanical Soft);Thin liquid   Liquid Administration via: Cup;Straw Medication Administration: Whole meds with liquid Supervision: Patient able to self feed;Intermittent supervision to cue for compensatory strategies Compensations: Slow rate;Small sips/bites Postural Changes and/or Swallow Maneuvers: Seated upright 90 degrees;Upright 30-60 min after meal    Other  Recommendations Oral Care Recommendations: Oral care BID   Follow Up Recommendations  None    Frequency and Duration        Pertinent Vitals/Pain n/a    SLP Swallow Goals     Swallow Study Prior Functional Status       General HPI: 74 year old male with a history of hypertension, diabetes mellitus, stroke, seizure disorder, ischemic cardiomyopathy with ICD, and 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 Previous clinical swallow evaluation 12/2012 recommending regular diet and thin liquids. Type of Study: Bedside swallow evaluation Previous Swallow Assessment: see HPI Diet Prior to this Study: Dysphagia 2 (chopped);Thin liquids Temperature Spikes Noted: No Respiratory Status: Room  air History of Recent Intubation: No Behavior/Cognition: Alert;Cooperative;Pleasant mood Oral Cavity - Dentition: Missing dentition;Poor condition (edentulous on top, poor condition on bottom) Self-Feeding Abilities: Able to feed self;Needs assist Patient Positioning: Upright in bed Baseline Vocal Quality: Other (comment) (rough)    Oral/Motor/Sensory Function     Ice Chips Ice chips: Not tested   Thin Liquid Thin Liquid: Within functional limits Presentation: Self Fed;Straw    Nectar Thick Nectar Thick Liquid: Not tested   Honey Thick Honey Thick Liquid: Not tested   Puree Puree: Within functional limits Presentation: Self Fed;Spoon   Solid  Solid: Within functional limits (given condition of dentition)      Maxcine HamLaura Paiewonsky, M.A. CCC-SLP [phone]  Maxcine Hamaiewonsky, Ulas Zuercher 05/12/2014,2:58 PM

## 2014-05-12 NOTE — Consult Note (Signed)
NEURO HOSPITALIST CONSULT NOTE    Reason for Consult: seizure  HPI:                                                                                                                                          [name] is an 74 y.o. male with seizure disorder.  Patient has been seen in hospital multiple times prior to this visit for seizures. HE was initially started on Keppra.  Wife states the Keppra was working well BUT he would become very sedated to the point he would not eat or take part in daily activities. HE was again seen in the hospital and The Keppra was stopped and he was started on Depakote. Although he seemed more alert per wife she noted he was continuing to have seizures (staring spells) almost every day). The seizures are describes as periods of staring and non reponsive. She does not note any jerking movements.  When talking with the patient he states he "feels like he is going out--anxious--but able to hear others and not able to respond.   Wife has noted that over the past week he has been having these episodes every day--She has noted that his BP is usually systolic 40-50's and if his BP rises he will come out of it.  Many of those BP have been taken by EMS personal called to the house.   Patient was seen in Dr. Rosalyn Gess office and EEG was obtained showing no epileptiform activity.  She sent patient to ED to evaluate and change him to either Trileptal or topamax.   Past Medical History  Diagnosis Date  . 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 Automatic implantable cardiac defibrillator in situ   . Congestive heart failure, unspecified   . Retention of urine, unspecified   . Pneumonia, organism unspecified   . Cough   . Acute bronchitis   . Contusion of foot   . Other sign and symptom in breast   . Fibromyalgia   . Hyperlipidemia   . Coronary atherosclerosis of unspecified type of vessel, native or graft   . Esophageal reflux    . Type I (juvenile type) diabetes mellitus without mention of complication, uncontrolled   . Essential hypertension, benign   . Cocaine abuse   . Alcohol abuse   . Microalbuminuria   . GERD (gastroesophageal reflux disease)   . Arteriovenous malformation (AVM)     of colon  . Diverticulosis   . GI bleed     secondary to avm  . Umbilical hernia   . 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 History of oxygen administration     at bedtime uses  oxygen 2l/m nasally.    Past Surgical History  Procedure Laterality Date  . Back surgery      X 2  . Tonsillectomy    . Cardiac defibrillator placement    . Colonoscopy with propofol N/A 12/23/2013    Procedure: COLONOSCOPY WITH PROPOFOL;  Surgeon: Hart Carwin, MD;  Location: [facility] ENDOSCOPY;  Service: Endoscopy;  Laterality: N/A;  . Implantable cardioverter defibrillator (icd) generator change N/A 01/01/2012    Procedure: ICD GENERATOR CHANGE;  Surgeon: Marinus Maw, MD;  Location: [facility] CATH LAB;  Service: Cardiovascular;  Laterality: N/A;    Family History  Problem Relation Age of Onset  . Heart failure Mother   . Diabetes Sister   . Colon cancer Brother     questionable  . Prostate cancer Neg Hx      Social History:  reports that he has quit smoking. His smoking use included Cigarettes. He has a 110 pack-year smoking history. He has never used smokeless tobacco. He reports that he does not drink alcohol or use illicit drugs.  Allergies  Allergen Reactions  . Metformin Diarrhea    MEDICATIONS:                                                                                                                     Scheduled: . aspirin EC  81 mg Oral Daily  . finasteride  5 mg Oral Q breakfast  . heparin  5,000 Units Subcutaneous 3 times per day  . insulin aspart  0-5 Units Subcutaneous QHS  . insulin aspart  0-9 Units Subcutaneous TID WC  .  levETIRAcetam  500 mg Oral BID  . sertraline  50 mg Oral Q breakfast  . simvastatin  40 mg Oral QPM  . tamsulosin  0.4 mg Oral QPC supper   Continuous: . sodium chloride 100 mL/hr at 05/12/14 0321     ROS:                                                                                                                                       History obtained from the patient  General ROS: negative for - chills, fatigue, fever, night sweats, weight gain or weight loss Psychological ROS: negative for - behavioral disorder, hallucinations, memory difficulties, mood swings or suicidal ideation Ophthalmic ROS: negative for - blurry vision, double vision, eye pain or loss of vision ENT ROS: negative for -  epistaxis, nasal discharge, oral lesions, sore throat, tinnitus or vertigo Allergy and Immunology ROS: negative for - hives or itchy/watery eyes Hematological and Lymphatic ROS: negative for - bleeding problems, bruising or swollen lymph nodes Endocrine ROS: negative for - galactorrhea, hair pattern changes, polydipsia/polyuria or temperature intolerance Respiratory ROS: negative for - cough, hemoptysis, shortness of breath or wheezing Cardiovascular ROS: negative for - chest pain, dyspnea on exertion, edema or irregular heartbeat Gastrointestinal ROS: negative for - abdominal pain, diarrhea, hematemesis, nausea/vomiting or stool incontinence Genito-Urinary ROS: negative for - dysuria, hematuria, incontinence or urinary frequency/urgency Musculoskeletal ROS: negative for - joint swelling or muscular weakness Neurological ROS: as noted in HPI Dermatological ROS: negative for rash and skin lesion changes   Blood pressure 154/56, pulse 62, temperature 97.5 F (36.4 C), temperature source Axillary, resp. rate 14, weight 80.377 kg (177 lb 3.2 oz), SpO2 99 %.   Neurologic Examination:                                                                                                      HEENT-   Normocephalic, no lesions, without obvious abnormality.  Normal external eye and conjunctiva.  Normal TM's bilaterally.  Normal auditory canals and external ears. Normal external nose, mucus membranes and septum.  Normal pharynx. Cardiovascular- S1, S2 normal, pulses palpable throughout   Lungs- chest clear, no wheezing, rales, normal symmetric air entry Abdomen- normal findings: bowel sounds normal Extremities- no skin discoloration Lymph-no adenopathy palpable Musculoskeletal-no joint tenderness, deformity or swelling Skin-warm and dry, no hyperpigmentation, vitiligo, or suspicious lesions  Neurological Examination Mental Status: Alert, not oriented to place or year, thought content appropriate.  Speech low but fluent without evidence of aphasia.  Able to follow 3 step commands without difficulty. Cranial Nerves: II: Discs flat bilaterally; Visual fields grossly normal, pupils equal, round, reactive to light and accommodation III,IV, VI: ptosis not present, extra-ocular motions intact bilaterally V,VII: smile symmetric, facial light touch sensation normal bilaterally VIII: hearing normal bilaterally IX,X: uvula rises symmetrically XI: bilateral shoulder shrug XII: midline tongue extension Motor: Right and left UE has increased tone.  He is able to lift his left arm off the bed 2 feet.  Weak grip bilaterally.  Unable to move bilateral LE and wheelchair bound at baseline.  Tone and bulk:normal tone throughout; no atrophy noted Sensory: Pinprick  intact throughout, bilaterally Deep Tendon Reflexes: 2+ on the left UE and 3+ in the right UE. 2+ bilateral KJ no AJ  Plantars: Right: up going   Left: downgoing Cerebellar: Unable to assess Gait: unable to assess.       Lab Results: Basic Metabolic Panel:  Recent Labs Lab 05/11/14 1350 05/12/14 0743  NA 141 144  K 5.0 4.7  CL 103 110  CO2 29 29  GLUCOSE 127* 108*  BUN 33* 26*  CREATININE 1.32 0.82  CALCIUM 8.6 8.1*     Liver Function Tests:  Recent Labs Lab 05/11/14 1350  AST 18  ALT 10  ALKPHOS 64  BILITOT 0.7  PROT 7.4  ALBUMIN 2.7*   No results for input(s):  LIPASE, AMYLASE in the last 168 hours. No results for input(s): AMMONIA in the last 168 hours.  CBC:  Recent Labs Lab 05/11/14 1350 05/12/14 0743  WBC 11.0* 11.7*  NEUTROABS 7.8*  --   HGB 9.8* 9.1*  HCT 31.5* 28.4*  MCV 102.9* 101.4*  PLT 100* 96*    Cardiac Enzymes: No results for input(s): CKTOTAL, CKMB, CKMBINDEX, TROPONINI in the last 168 hours.  Lipid Panel: No results for input(s): CHOL, TRIG, HDL, CHOLHDL, VLDL, LDLCALC in the last 168 hours.  CBG:  Recent Labs Lab 05/11/14 1619 05/11/14 2152 05/11/14 2227 05/11/14 2254 05/12/14 0644  GLUCAP 106* 59* 65* 156* 118*    Microbiology: Results for orders placed or performed during the hospital encounter of 05/11/14  Blood culture (routine x 2)     Status: None (Preliminary result)   Collection Time: 05/11/14  2:22 PM  Result Value Ref Range Status   Specimen Description BLOOD LEFT HAND  Final   Special Requests BOTTLES DRAWN AEROBIC ONLY 5ML  Final   Culture   Final           BLOOD CULTURE RECEIVED NO GROWTH TO DATE CULTURE WILL BE HELD FOR 5 DAYS BEFORE ISSUING A FINAL NEGATIVE REPORT [facility]    Report Status PENDING  Incomplete    Coagulation Studies: No results for input(s): LABPROT, INR in the last 72 hours.  Imaging: Dg Chest 1 View  05/11/2014   CLINICAL DATA:  Seizures, altered mental status.  EXAM: CHEST  1 VIEW  COMPARISON:  04/20/2014.  FINDINGS: Trachea is midline. Heart size normal. Lungs are clear. No pleural fluid. ICD lead tip projects over the right ventricle.  IMPRESSION: No acute findings.   Electronically Signed   By: Leanna BattlesMelinda  Blietz M.D.   On: 05/11/2014 15:08    EEG: 4.4.16 Impression: This predominantly drowsy and asleep EEG is abnormal due to moderate diffuse slowing of the background. There is a  single broad sharp transient over the left temporal region, of unclear clinical significance. There were no electrographic seizures in this study.  Clinical Correlation of the above findings indicates diffuse cerebral dysfunction that is non-specific in etiology and can be seen with hypoxic/ischemic injury, toxic/metabolic encephalopathies, or medication effect. The absence of epileptiform discharges does not rule out a clinical diagnosis of epilepsy. Clinical correlation is advised.   Assessment and plan per attending neurologist  Felicie Mornavid Slezak PA-C [facility] [phone]  05/12/2014, 11:07 AM   Assessment/Plan: 74 yo M with staring spells. Of note, when he has these at home, his systolics are consistently low per his wife. I do wonder if this represents presyncope rather than seizures. Especially given his abnormal EEGs, I would not favor stopping AEDs at this time. I wonder if ambulatory EEG may be helpful in determining the true nature of these events.   1) stop keppra(h/o severe sedation on it) and depakote(not controllign spells) 2) Start trileptal 150mg  BID x 1 week, then 300mg  BID 3) Will need outpatient sodium check as outpatient in 1 - 2 weeks.  4) hypotension/hypoglycemia per IM 5) will continue to follow.   Ritta SlotMcNeill Carlynn Leduc, MD [facility] [phone]  If 7pm- 7am, please page neurology on call as listed in [facility].

## 2014-05-12 NOTE — Progress Notes (Signed)
SLP Cancellation Note  Patient Details Name: [name] MRN: [id number] DOB: 05/20/1940   Cancelled treatment:       Reason Eval/Treat Not Completed: Patient having testing done in room. Will return as able.   Frank HamLaura [name], M.A. CCC-SLP [phone]  Frank Hamaiewonsky, [name] 05/12/2014, 11:08 AM

## 2014-05-13 DIAGNOSIS — G40909 Epilepsy, unspecified, not intractable, without status epilepticus: Secondary | ICD-10-CM

## 2014-05-13 LAB — CULTURE, BLOOD (ROUTINE X 2)

## 2014-05-13 LAB — CBC
HEMATOCRIT: 26.5 % — AB (ref 39.0–52.0)
Hemoglobin: 8.1 g/dL — ABNORMAL LOW (ref 13.0–17.0)
MCH: 31.6 pg (ref 26.0–34.0)
MCHC: 30.6 g/dL (ref 30.0–36.0)
MCV: 103.5 fL — AB (ref 78.0–100.0)
Platelets: 103 10*3/uL — ABNORMAL LOW (ref 150–400)
RBC: 2.56 MIL/uL — ABNORMAL LOW (ref 4.22–5.81)
RDW: 13.3 % (ref 11.5–15.5)
WBC: 10 10*3/uL (ref 4.0–10.5)

## 2014-05-13 LAB — GLUCOSE, CAPILLARY
GLUCOSE-CAPILLARY: 135 mg/dL — AB (ref 70–99)
GLUCOSE-CAPILLARY: 151 mg/dL — AB (ref 70–99)
Glucose-Capillary: 76 mg/dL (ref 70–99)
Glucose-Capillary: 201 mg/dL — ABNORMAL HIGH (ref 70–99)

## 2014-05-13 LAB — BASIC METABOLIC PANEL
Anion gap: 10 (ref 5–15)
BUN: 11 mg/dL (ref 6–23)
CHLORIDE: 107 mmol/L (ref 96–112)
CO2: 24 mmol/L (ref 19–32)
CREATININE: 0.63 mg/dL (ref 0.50–1.35)
Calcium: 7.8 mg/dL — ABNORMAL LOW (ref 8.4–10.5)
GFR calc Af Amer: >90 mL/min (ref >90)
GFR calc non Af Amer: >90 mL/min (ref >90)
GLUCOSE: 76 mg/dL (ref 70–99)
Potassium: 3.8 mmol/L (ref 3.5–5.1)
Sodium: 141 mmol/L (ref 135–145)

## 2014-05-13 LAB — HEMOGLOBIN A1C
HEMOGLOBIN A1C: 5.6 % (ref 4.8–5.6)
Hgb A1c MFr Bld: 5.5 % (ref 4.8–5.6)
Mean Plasma Glucose: 114 mg/dL
Mean Plasma Glucose: 111 mg/dL

## 2014-05-13 LAB — HIV ANTIBODY (ROUTINE TESTING W REFLEX): HIV Screen 4th Generation wRfx: NONREACTIVE

## 2014-05-13 LAB — LEVETIRACETAM LEVEL: LEVETIRACETAM: NOT DETECTED ug/mL (ref 10.0–40.0)

## 2014-05-13 MED ORDER — VALSARTAN 80 MG PO TABS: 80.0000 mg | ORAL_TABLET | Freq: Every day | ORAL | Status: DC

## 2014-05-13 MED ORDER — OXCARBAZEPINE 300 MG PO TABS: 300.0000 mg | ORAL_TABLET | Freq: Two times a day (BID) | ORAL | Status: AC

## 2014-05-13 MED ORDER — LEVOTHYROXINE SODIUM 25 MCG PO TABS: 25.0000 ug | ORAL_TABLET | Freq: Every day | ORAL | Status: DC

## 2014-05-13 MED ORDER — FUROSEMIDE 40 MG PO TABS: 20.0000 mg | ORAL_TABLET | Freq: Every day | ORAL | Status: AC

## 2014-05-13 MED ORDER — OXCARBAZEPINE 150 MG PO TABS: 150.0000 mg | ORAL_TABLET | Freq: Two times a day (BID) | ORAL | Status: AC

## 2014-05-13 MED ORDER — LEVOTHYROXINE SODIUM 25 MCG PO TABS: 25.0000 ug | ORAL_TABLET | Freq: Every day | ORAL | Status: AC

## 2014-05-13 NOTE — Discharge Summary (Signed)
Physician Discharge Summary  [name] ZOX:[id number] DOB: 1940-11-05 DOA: 05/11/2014  PCP: Oliver Barre, MD  Admit date: 05/11/2014 Discharge date: 05/13/2014  Time spent: 45 minutes  Recommendations for Outpatient Follow-up:  Patient will be discharged to home.  He will need to follow up with his primary care physician within one week of discharge. Patient will need to have repeat CBC at that time. Patient to continue a dysphagia 3 diet. Patient should also follow up with Dr. Karel Jarvis, neurology, as an outpatient.    Discharge Diagnoses:  Principal Problem:   Hypotension Active Problems:   Diabetes mellitus, type 2   HYPERTENSION, BENIGN ESSENTIAL   CAD (coronary artery disease)   Chronic systolic congestive heart failure, NYHA class 1   Seizure disorder   Anemia   FTT (failure to thrive) in adult   Bradycardia   Altered mental status   Dehydration   Arterial hypotension   Discharge Condition: Stable  Diet recommendation: Dysphagia 3  Filed Weights   05/12/14 0503 05/13/14 0500  Weight: 80.377 kg (177 lb 3.2 oz) 82.6 kg (182 lb 1.6 oz)    History of present illness:  On 05/11/2014 by Ms Junious Silk with Dr. Clydia Llano This is a 74 year old male patient past history of stroke in 2010 and has been nonambulatory since. He also has mild systolic heart failure, hypertension and recently diagnosed with seizures last November. According to his wife was at the bedside providing history the patient had progressive decline since November 2015 when he was admitted for new onset seizures. Noted with variable oral intake since that time. He's had issues with chronic diarrhea which currently are not affecting him. He has been evaluated by Dr. Dickie La for this in the past. Because of patient's variable oral intake the wife has had to adjust how frequently she administers his long-acting insulin because of recurrent hypoglycemia. Also last year because of lowered blood pressure they've had to  just his cardiac medications. Patient does have known dysphagia but doesn't do well with thickened liquids. The wife reports that her grandson and other family members help with the patient at home and she has a hospital bed and other appropriate equipment at home. The wife reports that the patient has requested to not ever be placed in a nursing facility. She confirms that the patient is a DO NOT RESUSCITATE noting that he already has poor quality of life. Prior to most recent changes patient had been able to operate his motorized wheelchair on good days.  Today during evaluation at the neurology office the patient's systolic blood pressure was 70/40 with a heart rate of 61 and he was lethargic. He was subsequently sent to the ER for further evaluation. His arrival to the ER the Patient has been given 1500 mL of fluid and current blood pressure is 130/50 with a heart rate of 50. His rectal temperature was low at 96.5. Initially he was somewhat drowsy and lethargic but with improvement in blood pressure patient's mentation is also improved. Initial lactic acid was 2.6 but after fluid hydration restoration of appropriate blood pressure lactic acid has decreased to 1.64. BNP was 173, and troponin 2 has been negative. BUN was slightly elevated at 33 with a creatinine of 1.3. Glucose was 127. White count was 11,000 with a hemoglobin of 9.8, MCV 102.9 and platelet count 100,000. Urinalysis was unremarkable and does not appear consistent with infection. Because of presentation with elevated lactic acid and hypotension a septic workup was initiated and blood  cultures and urine culture are pending. Chest x-ray demonstrated no acute findings.  Hospital Course:  Hypotension -Likely due to volume depletion and BP medications -Responded nicely to IV fluids -Blood cultures 1/2 SCN- likely contaminant -Urinalysis negative for pyuria -Chest x-ray negative for infiltrates -Lactic acid 2.6---> 1.48 -Remain off of  antibiotics and observe  Seizure disorder -VPA level was 75.9 on 05/11/14 -consult neurology--spoke with Dr. Amada Jupiter- possible due to hypotension vs true seizure -VPA was stopped 4/4 -Last seizure 2 days prior to admission -Patient placed on Trileptal, will need to continue this hard 50 mg twice a day for 7 days, then increase to 300 mg BID -Patient will need to follow-up with his neurologist as an outpatient -This was discussed with patient's wife  Failure to thrive -Nutrition consult -Wife does not want patient to be placed in nursing facility -She expresses HE is a DO NOT RESUSCITATE and reports that he does have poor quality of life -Patient with known dysphagia but intolerance to thickened liquids-SLP evaluation -Dysphagia 3 diet  Diabetes mellitus type 2 -Hemoglobin A1c 5.5 -Continue home regimen upon discharge  Bradycardia -Discontinued carvedilol and monitor on telemetry -Resolved, restarting medication should be discussed with PCP upon discharge  Hypothermia -am cortisol pending -seems to have improved  Hypothyroidism  -TSH--5.248, FT4 0.42 - will start low dose synthroid -Patient will need to follow up with PCP within 4-6 weeks regarding dosing  Coronary artery disease -Continue aspirin 81 mg daily -No chest pain at this time  Ischemic cardiomyopathy -07/07/2009 echocardiogram shows EF 40-45 percent, diffuse HK -clinically compensated -Repeat echocardiogram since last done 2010-this information may be helpful regarding adjustment of antihypertensive medications and diuretics and in making decisions about end-of-life care -s/p AICD -Continue lasix, reduced dose  Thrombocytopenia -Serum B12 >2000 -HIV, Heb C, and Hep B surface Ag neg -plts improving   Chronic macrocytic anemia -Baseline appears to be between 8-9 -Currently 8.1 (drop from admission likely dilutional)  Code status:  DNR  Procedures: Echocardiogram  Consultations: Neurology  Discharge Exam: Filed Vitals:   05/13/14 1253  BP: 142/48  Pulse: 83  Temp: 97.8 F (36.6 C)  Resp: 18     General: Well developed, well nourished  HEENT: NCAT, mucous membranes moist.  Cardiovascular: S1 S2 auscultated, no rubs, murmurs or gallops. Regular rate and rhythm.  Respiratory: Clear to auscultation bilaterally with equal chest rise  Abdomen: Soft, nontender, nondistended, + bowel sounds  Extremities: warm dry without cyanosis clubbing.  +1 edema    Discharge Instructions      Discharge Instructions    Discharge instructions    Complete by:  As directed   Patient will be discharged to home.  He will need to follow up with his primary care physician within one week of discharge. Patient will need to have repeat CBC at that time. Patient to continue a dysphagia 3 diet. Patient should also follow up with Dr. Karel Jarvis, neurology, as an outpatient.            Medication List    STOP taking these medications        carvedilol 25 MG tablet  Commonly known as:  COREG     divalproex 250 MG DR tablet  Commonly known as:  DEPAKOTE     divalproex 500 MG DR tablet  Commonly known as:  DEPAKOTE     levETIRAcetam 500 MG tablet  Commonly known as:  KEPPRA      TAKE these medications  aspirin EC 81 MG tablet  Take 81 mg by mouth daily.     finasteride 5 MG tablet  Commonly known as:  PROSCAR  Take 1 tablet (5 mg total) by mouth daily with breakfast.     furosemide 40 MG tablet  Commonly known as:  LASIX  Take 0.5 tablets (20 mg total) by mouth daily with breakfast.     insulin NPH Human 100 UNIT/ML injection  Commonly known as:  HUMULIN N,NOVOLIN N  Inject 10 Units into the skin daily before breakfast.     levothyroxine 25 MCG tablet  Commonly known as:  SYNTHROID, LEVOTHROID  Take 1 tablet (25 mcg total) by mouth daily before breakfast.     liver oil-zinc oxide 40 % ointment   Commonly known as:  DESITIN  Apply 1 application topically as needed for irritation.     loperamide 1 MG/5ML solution  Commonly known as:  IMODIUM  Take 6 mg by mouth as needed for diarrhea or loose stools (TAKES AS NEEDED).     OXcarbazepine 150 MG tablet  Commonly known as:  TRILEPTAL  Take 1 tablet (150 mg total) by mouth 2 (two) times daily.     Oxcarbazepine 300 MG tablet  Commonly known as:  TRILEPTAL  Take 1 tablet (300 mg total) by mouth 2 (two) times daily.  Start taking on:  05/19/2014     potassium chloride SA 20 MEQ tablet  Commonly known as:  K-DUR,KLOR-CON  Take 1 tablet (20 mEq total) by mouth 2 (two) times daily.     sertraline 50 MG tablet  Commonly known as:  ZOLOFT  Take 1 tablet (50 mg total) by mouth daily with breakfast.     simvastatin 40 MG tablet  Commonly known as:  ZOCOR  Take 1 tablet (40 mg total) by mouth every evening.     tamsulosin 0.4 MG Caps capsule  Commonly known as:  FLOMAX  Take 1 capsule (0.4 mg total) by mouth daily after supper.     valsartan 80 MG tablet  Commonly known as:  DIOVAN  Take 1 tablet (80 mg total) by mouth daily.       Allergies  Allergen Reactions  . Metformin Diarrhea  . Keppra [Levetiracetam] Other (See Comments)    Makes him sleep all the time   Follow-up Information    Follow up with Oliver Barre, MD. Schedule an appointment as soon as possible for a visit in 1 week.   Specialties:  Internal Medicine, Radiology   Why:  Hospital follow up   Contact information:   [address] [place] [facility] [place] [address] [phone]       Follow up with Van Clines, MD. Schedule an appointment as soon as possible for a visit in 1 week.   Specialty:  Neurology   Why:  hospital follow up   Contact information:   [address] [place] [address] [phone]        The results of significant diagnostics from this hospitalization (including imaging, microbiology, ancillary and laboratory)  are listed below for reference.    Significant Diagnostic Studies: Dg Chest 1 View  05/11/2014   CLINICAL DATA:  Seizures, altered mental status.  EXAM: CHEST  1 VIEW  COMPARISON:  04/20/2014.  FINDINGS: Trachea is midline. Heart size normal. Lungs are clear. No pleural fluid. ICD lead tip projects over the right ventricle.  IMPRESSION: No acute findings.   Electronically Signed   By: Juliette Alcide  Blietz M.D.   On: 05/11/2014 15:08   Ct Head Wo Contrast  04/20/2014   CLINICAL DATA:  Seizure activity for 1 atherosclerosis seizure activity for 1 hour. Hypertension.  EXAM: CT HEAD WITHOUT CONTRAST  TECHNIQUE: Contiguous axial images were obtained from the base of the skull through the vertex without intravenous contrast.  COMPARISON:  Head CT scan 01/19/2014.  FINDINGS: Cortical atrophy and chronic microvascular ischemic change are identified. Remote infarction in the left thalamus is noted. No evidence of acute abnormality including infarct, hemorrhage, mass lesion, mass effect, midline shift or abnormal extra-axial fluid collection is seen. No hydrocephalus or pneumocephalus. The calvarium is intact.  IMPRESSION: No acute abnormality.  Atrophy, chronic microvascular ischemic change remote left thalamic infarct.   Electronically Signed   By: Drusilla Kanner M.D.   On: 04/20/2014 20:32   Dg Chest Portable 1 View  04/20/2014   CLINICAL DATA:  Initial evaluation for seizure today with history of hypertension and congestive heart failure  EXAM: PORTABLE CHEST - 1 VIEW  COMPARISON:  09/19/2013  FINDINGS: Mild cardiac enlargement. Vascular pattern normal. Single lead defibrillator stable. Lungs clear.  IMPRESSION: No active disease.   Electronically Signed   By: Esperanza Heir M.D.   On: 04/20/2014 20:01    Microbiology: Recent Results (from the past 240 hour(s))  Blood culture (routine x 2)     Status: None   Collection Time: 05/11/14  2:22 PM  Result Value Ref Range Status   Specimen Description BLOOD LEFT  HAND  Final   Special Requests BOTTLES DRAWN AEROBIC ONLY  Final   Culture   Final    STAPHYLOCOCCUS SPECIES (COAGULASE NEGATIVE) Note: THE SIGNIFICANCE OF ISOLATING THIS ORGANISM FROM A SINGLE SET OF BLOOD CULTURES WHEN MULTIPLE SETS ARE DRAWN IS UNCERTAIN. PLEASE NOTIFY THE MICROBIOLOGY DEPARTMENT WITHIN ONE WEEK IF SPECIATION AND SENSITIVITIES ARE REQUIRED. Note: Gram Stain Report Called to,Read Back By and Verified With: ANTONIQUE BOWEN AT 9:53 P.M. ON 05/12/2014 WARRB Performed at [facility]    Report Status 05/13/2014 FINAL  Final  Urine culture     Status: None   Collection Time: 05/11/14  2:35 PM  Result Value Ref Range Status   Specimen Description URINE, RANDOM  Final   Special Requests NONE  Final   Colony Count NO GROWTH Performed at [facility]   Final   Culture NO GROWTH Performed at [facility]   Final   Report Status 05/12/2014 FINAL  Final  Blood culture (routine x 2)     Status: None (Preliminary result)   Collection Time: 05/11/14  6:16 PM  Result Value Ref Range Status   Specimen Description BLOOD RIGHT ANTECUBITAL  Final   Special Requests BOTTLES DRAWN AEROBIC AND ANAEROBIC 5CC  Final   Culture   Final           BLOOD CULTURE RECEIVED NO GROWTH TO DATE CULTURE WILL BE HELD FOR 5 DAYS BEFORE ISSUING A FINAL NEGATIVE REPORT Performed at [facility]    Report Status PENDING  Incomplete     Labs: Basic Metabolic Panel:  Recent Labs Lab 05/11/14 1350 05/12/14 0743 05/13/14 0512  NA 141 144 141  K 5.0 4.7 3.8  CL 103 110 107  CO2 29 29 24   GLUCOSE 127* 108* 76  BUN 33* 26* 11  CREATININE 1.32 0.82 0.63  CALCIUM 8.6 8.1* 7.8*   Liver Function Tests:  Recent Labs Lab 05/11/14 1350  AST 18  ALT 10  ALKPHOS 64  BILITOT 0.7  PROT 7.4  ALBUMIN 2.7*   No results for input(s): LIPASE, AMYLASE in the last 168 hours.  Recent Labs Lab 05/12/14 1235  AMMONIA 29   CBC:  Recent Labs Lab 05/11/14 1350  05/12/14 0743 05/13/14 0512  WBC 11.0* 11.7* 10.0  NEUTROABS 7.8*  --   --   HGB 9.8* 9.1* 8.1*  HCT 31.5* 28.4* 26.5*  MCV 102.9* 101.4* 103.5*  PLT 100* 96* 103*   Cardiac Enzymes: No results for input(s): CKTOTAL, CKMB, CKMBINDEX, TROPONINI in the last 168 hours. BNP: BNP (last 3 results)  Recent Labs  05/11/14 1350  BNP 173.2*    ProBNP (last 3 results)  Recent Labs  06/28/13 1440 09/19/13 1707  PROBNP 1400.0* 920.2*    CBG:  Recent Labs Lab 05/12/14 1612 05/12/14 2140 05/13/14 0630 05/13/14 0848 05/13/14 1057  GLUCAP 160* 110* 76 135* 201*       Signed:  Telena Peyser  [facility] 05/13/2014, 3:03 PM

## 2014-05-13 NOTE — Discharge Instructions (Signed)
Hypotension As your heart beats, it forces blood through your arteries. This force is your blood pressure. If your blood pressure is too low for you to go about your normal activities or to support the organs of your body, you have hypotension. Hypotension is also referred to as low blood pressure. When your blood pressure becomes too low, you may not get enough blood to your brain. As a result, you may feel weak, feel lightheaded, or develop a rapid heart rate. In a more severe case, you may faint. CAUSES Various conditions can cause hypotension. These include:  Blood loss.  Dehydration.  Heart or endocrine problems.  Pregnancy.  Severe infection.  Not having a well-balanced diet filled with needed nutrients.  Severe allergic reactions (anaphylaxis). Some medicines, such as blood pressure medicine or water pills (diuretics), may lower your blood pressure below normal. Sometimes taking too much medicine or taking medicine not as directed can cause hypotension. TREATMENT  Hospitalization is sometimes required for hypotension if fluid or blood replacement is needed, if time is needed for medicines to wear off, or if further monitoring is needed. Treatment might include changing your diet, changing your medicines (including medicines aimed at raising your blood pressure), and use of support stockings. HOME CARE INSTRUCTIONS   Drink enough fluids to keep your urine clear or pale yellow.  Take your medicines as directed by your health care provider.  Get up slowly from reclining or sitting positions. This gives your blood pressure a chance to adjust.  Wear support stockings as directed by your health care provider.  Maintain a healthy diet by including nutritious food, such as fruits, vegetables, nuts, whole grains, and lean meats. SEEK MEDICAL CARE IF:  You have vomiting or diarrhea.  You have a fever for more than 2-3 days.  You feel more thirsty than usual.  You feel weak and  tired. SEEK IMMEDIATE MEDICAL CARE IF:   You have chest pain or a fast or irregular heartbeat.  You have a loss of feeling in some part of your body, or you lose movement in your arms or legs.  You have trouble speaking.  You become sweaty or feel lightheaded.  You faint. MAKE SURE YOU:   Understand these instructions.  Will watch your condition.  Will get help right away if you are not doing well or get worse. Document Released: 01/23/2005 Document Revised: 11/13/2012 Document Reviewed: 07/26/2012 [facility] Patient Information 2015 [place] [place]. This information is not intended to replace advice given to you by your health care provider. Make sure you discuss any questions you have with your health care provider. Seizure, Adult A seizure is abnormal electrical activity in the brain. Seizures usually last from 30 seconds to 2 minutes. There are various types of seizures. Before a seizure, you may have a warning sensation (aura) that a seizure is about to occur. An aura may include the following symptoms:   Fear or anxiety.  Nausea.  Feeling like the room is spinning (vertigo).  Vision changes, such as seeing flashing lights or spots. Common symptoms during a seizure include:  A change in attention or behavior (altered mental status).  Convulsions with rhythmic jerking movements.  Drooling.  Rapid eye movements.  Grunting.  Loss of bladder and bowel control.  Bitter taste in the mouth.  Tongue biting. After a seizure, you may feel confused and sleepy. You may also have an injury resulting from convulsions during the seizure. HOME CARE INSTRUCTIONS   If you are given medicines,  take them exactly as prescribed by your health care provider.  Keep all follow-up appointments as directed by your health care provider.  Do not swim or drive or engage in risky activity during which a seizure could cause further injury to you or others until your health care provider  says it is OK.  Get adequate rest.  Teach friends and family what to do if you have a seizure. They should:  Lay you on the ground to prevent a fall.  Put a cushion under your head.  Loosen any tight clothing around your neck.  Turn you on your side. If vomiting occurs, this helps keep your airway clear.  Stay with you until you recover.  Know whether or not you need emergency care. SEEK IMMEDIATE MEDICAL CARE IF:  The seizure lasts longer than 5 minutes.  The seizure is severe or you do not wake up immediately after the seizure.  You have an altered mental status after the seizure.  You are having more frequent or worsening seizures. Someone should drive you to the emergency department or call local emergency services (911 in [place] MAKE SURE YOU:  Understand these instructions.  Will watch your condition.  Will get help right away if you are not doing well or get worse. Document Released: 01/21/2000 Document Revised: 11/13/2012 Document Reviewed: 09/04/2012 [facility] Patient Information 2015 [place], [place]. This information is not intended to replace advice given to you by your health care provider. Make sure you discuss any questions you have with your health care provider.

## 2014-05-13 NOTE — Progress Notes (Signed)
Subjective: No further episodes while in hospital and no Complaints of SE.   Exam: Filed Vitals:   05/13/14 0829  BP: 144/61  Pulse: 85  Temp: 98.1 F (36.7 C)  Resp: 18   Gen: In bed, NAD MS: Alert and oriented to hospital with no aphasia.  ZO:XWRUEACN:PERRLA, EOMI, TML, FACE equal symmetrical Motor: Right and left UE has increased tone. He is able to lift his left arm off the bed 2 feet. Weak grip bilaterally. Unable to move bilateral LE and wheelchair bound at baseline Sensory: intact throughout DTR: 2+ on the left UE and 3+ in the right UE. 2+ bilateral KJ no AJ  Pertinent Labs: NA 141 WBC 10  Felicie MornDavid Mcgurn PA-C [facility] [phone]  Impression: 74 yo M with staring spells. Of note, when he has these at home, his systolics are consistently low per his wife.  Question if this represents presyncope rather than seizures. Started Trileptal 150 mg BID for one week and then increase to 300 mg BID.  No further spells while in hospital and tolerating Trileptal well.    Recommendations: 1) continue trileptal 2) follow up with Dr. Karel JarvisAquino as outpatient.   Ritta SlotMcNeill Shivonne Schwartzman, MD [facility] [phone]  If 7pm- 7am, please page neurology on call as listed in AMION.   05/13/2014, 10:03 AM

## 2014-05-14 ENCOUNTER — Telehealth: Payer: Self-pay | Admitting: *Deleted

## 2014-05-14 LAB — CORTISOL-AM, BLOOD: Cortisol - AM: 13.7 ug/dL (ref 4.3–22.4)

## 2014-05-14 NOTE — Telephone Encounter (Signed)
Transition Care Management Follow-up Telephone Call D/C 05/13/14  How have you been since you were released from the hospital? Called pt spoke with wife she states he is doing fairly well   Do you understand why you were in the hospital? YES   Do you understand the discharge instrcutions? YES, went over completely with wife since she had questions concerning medications  Items Reviewed:  Medications reviewed: YES  Allergies reviewed: YES  Dietary changes reviewed: YES, wife states he doesn't want to eat, has no appetite she has been giving him boost. Inform wife boost or ensure can be nutritional replacement because he can still get his nutrients but he does need to try to eat food to gain his strength  Referrals reviewed: NO referrals needed   Functional Questionnaire:   Activities of Daily Living (ADLs):   She states he are independent in the following: feeding States he require a lot assistance  She helps him get around, bathe him ect with the following: ambulation, bathing and hygiene, grooming and dressing   Any transportation issues/concerns?: NO   Any patient concerns? NO   Confirmed importance and date/time of follow-up visits scheduled: Yes, made appt for 05/19/14 w/Dr. Jonny RuizJohn   Confirmed with patient if condition begins to worsen call PCP or go to the ER.

## 2014-05-18 LAB — CULTURE, BLOOD (ROUTINE X 2): Culture: NO GROWTH

## 2014-05-19 ENCOUNTER — Other Ambulatory Visit (INDEPENDENT_AMBULATORY_CARE_PROVIDER_SITE_OTHER): Payer: Commercial Managed Care - HMO

## 2014-05-19 ENCOUNTER — Ambulatory Visit (INDEPENDENT_AMBULATORY_CARE_PROVIDER_SITE_OTHER): Payer: Commercial Managed Care - HMO | Admitting: Internal Medicine

## 2014-05-19 ENCOUNTER — Encounter: Payer: Self-pay | Admitting: Internal Medicine

## 2014-05-19 VITALS — BP 118/60 | HR 51 | Temp 98.7°F | Resp 16

## 2014-05-19 DIAGNOSIS — I5022 Chronic systolic (congestive) heart failure: Secondary | ICD-10-CM | POA: Diagnosis not present

## 2014-05-19 DIAGNOSIS — R197 Diarrhea, unspecified: Secondary | ICD-10-CM

## 2014-05-19 DIAGNOSIS — I10 Essential (primary) hypertension: Secondary | ICD-10-CM

## 2014-05-19 DIAGNOSIS — D6489 Other specified anemias: Secondary | ICD-10-CM | POA: Diagnosis not present

## 2014-05-19 DIAGNOSIS — R5381 Other malaise: Secondary | ICD-10-CM

## 2014-05-19 LAB — CBC WITH DIFFERENTIAL/PLATELET
BASOS ABS: 0 10*3/uL (ref 0.0–0.1)
Basophils Relative: 0.5 % (ref 0.0–3.0)
Eosinophils Absolute: 0.1 10*3/uL (ref 0.0–0.7)
Eosinophils Relative: 0.8 % (ref 0.0–5.0)
HCT: 24.4 % — ABNORMAL LOW (ref 39.0–52.0)
Hemoglobin: 8.1 g/dL — ABNORMAL LOW (ref 13.0–17.0)
LYMPHS PCT: 8.8 % — AB (ref 12.0–46.0)
Lymphs Abs: 0.8 10*3/uL (ref 0.7–4.0)
MCHC: 33.2 g/dL (ref 30.0–36.0)
MCV: 96.5 fl (ref 78.0–100.0)
MONOS PCT: 13.9 % — AB (ref 3.0–12.0)
Monocytes Absolute: 1.3 10*3/uL — ABNORMAL HIGH (ref 0.1–1.0)
NEUTROS PCT: 76 % (ref 43.0–77.0)
Neutro Abs: 7.1 10*3/uL (ref 1.4–7.7)
PLATELETS: 461 10*3/uL — AB (ref 150.0–400.0)
RBC: 2.53 Mil/uL — ABNORMAL LOW (ref 4.22–5.81)
RDW: 14.1 % (ref 11.5–15.5)
WBC: 9.4 10*3/uL (ref 4.0–10.5)

## 2014-05-19 LAB — BASIC METABOLIC PANEL
BUN: 14 mg/dL (ref 6–23)
CALCIUM: 8.4 mg/dL (ref 8.4–10.5)
CHLORIDE: 103 meq/L (ref 96–112)
CO2: 27 mEq/L (ref 19–32)
CREATININE: 0.86 mg/dL (ref 0.40–1.50)
GFR: 111.72 mL/min (ref >60.00)
Glucose, Bld: 172 mg/dL — ABNORMAL HIGH (ref 70–99)
Potassium: 4.5 mEq/L (ref 3.5–5.1)
Sodium: 133 mEq/L — ABNORMAL LOW (ref 135–145)

## 2014-05-19 MED ORDER — VALSARTAN 80 MG PO TABS: 80.0000 mg | ORAL_TABLET | Freq: Every day | ORAL | Status: AC

## 2014-05-19 NOTE — Assessment & Plan Note (Signed)
No overt bleeding, not on anticoag, likely anemia of chronic dz, for f/u hgb today, consider transfuse for < 7

## 2014-05-19 NOTE — Assessment & Plan Note (Signed)
Compensated on reduced lasix,  to f/u any worsening symptoms or concerns

## 2014-05-19 NOTE — Patient Instructions (Signed)
Please continue all other medications as before, and refills have been done if requested.  Please have the pharmacy call with any other refills you may need.  Please keep your appointments with your specialists as you may have planned  You will be contacted regarding the referral for: Home Health, with RN and Physical therapy  You are given the prescription for the hoyer lift to give to Home Health  Please go to the LAB in the Basement (turn left off the elevator) for the tests to be done today  You will be contacted by phone if any changes need to be made immediately.  Otherwise, you will receive a letter about your results with an explanation, but please check with MyChart first.  Please remember to sign up for MyChart if you have not done so, as this will be important to you in the future with finding out test results, communicating by private email, and scheduling acute appointments online when needed.  Please return in 3 months, or sooner if needed

## 2014-05-19 NOTE — Progress Notes (Signed)
Subjective:    Patient ID: [name], male    DOB: 08/10/1940, 74 y.o.   MRN: [id number]  HPI  Here to f/u recent hospn with hypotension, likely due to reduced volume and anti-BP meds; tx with IVFs, also valsartan decr to 80, and lasix now half previous.  Hgb also noted decresaed to 8.1 post IVF's with recomnedation for f/u today, Iron/B12 normal.  No transfusion needed.  Pt now much improved with alertness, able to operate his power scooter by himself with direction today, but overall still weak and unable to help pull himself up in bed such as he did after PT last yr.  Lives with wife, has no other help , and [facility], as he has difficulty with helping now with transfers out of bed  No fever and no new complaints except did have apparently an episode of diarrhea prior to exam today, no blood, no fever, abd pain post recent hospn. No prior hx of c diff.  Has hx of recurrent short episodes of diarrhea in past Past Medical History  Diagnosis Date  . 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 Automatic implantable cardiac defibrillator in situ   . Congestive heart failure, unspecified   . Retention of urine, unspecified   . Pneumonia, organism unspecified   . Cough   . Acute bronchitis   . Contusion of foot   . Other sign and symptom in breast   . Fibromyalgia   . Hyperlipidemia   . Coronary atherosclerosis of unspecified type of vessel, native or graft   . Esophageal reflux   . Type I (juvenile type) diabetes mellitus without mention of complication, uncontrolled   . Essential hypertension, benign   . Cocaine abuse   . Alcohol abuse   . Microalbuminuria   . GERD (gastroesophageal reflux disease)   . Arteriovenous malformation (AVM)     of colon  . Diverticulosis   . GI bleed     secondary to avm  . Umbilical hernia   . 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 History of oxygen administration     at bedtime uses oxygen 2l/m nasally.   Past Surgical History  Procedure Laterality Date  . Back surgery      X 2  . Tonsillectomy    . Cardiac defibrillator placement    . Colonoscopy with propofol N/A 12/23/2013    Procedure: COLONOSCOPY WITH PROPOFOL;  Surgeon: Hart Carwinora M Brodie, MD;  Location: [facility];  Service: Endoscopy;  Laterality: N/A;  . Implantable cardioverter defibrillator (icd) generator change N/A 01/01/2012    Procedure: ICD GENERATOR CHANGE;  Surgeon: Marinus MawGregg W Taylor, MD;  [facility];  Service: Cardiovascular;  Laterality: N/A;    reports that he has quit smoking. His smoking use included Cigarettes. He has a 110 pack-year smoking history. He has never used smokeless tobacco. He reports that he does not drink alcohol or use illicit drugs. family history includes Colon cancer in his brother; Diabetes in his sister; Heart failure in his mother. There is no history of Prostate cancer. Allergies  Allergen Reactions  . Metformin Diarrhea  . Keppra [Levetiracetam] Other (See Comments)    Makes him sleep all the time   Review of Systems Unable due to pt mental status, noncommunicative orally    Objective:   Physical Exam BP  118/60 mmHg  Pulse 51  Temp(Src) 98.7 F (37.1 C)  Resp 16  SpO2 93% VS noted, more alert , sitting up well in wheelchair today Constitutional: Pt appears in no significant distress HENT: Head: NCAT.  Right Ear: External ear normal.  Left Ear: External ear normal.  Eyes: . Pupils are equal, round, and reactive to light. Conjunctivae and EOM are normal Neck: Normal range of motion. Neck supple.  Cardiovascular: Normal rate and regular rhythm.   Pulmonary/Chest: Effort normal and breath sounds without rales or wheezing.  Abd:  Soft, NT, ND, + BS Neurological: Pt is alert. As above, at baseline per wife Skin: Skin is warm. No rash, no LE edema Psychiatric: Pt behavior is normal. No  agitation.   BP Readings from Last 3 Encounters:  05/19/14 118/60  05/13/14 142/48  05/11/14 70/40       Assessment & Plan:

## 2014-05-19 NOTE — Assessment & Plan Note (Signed)
Now more able to participate in PT - [facility] and PT, as well as rx given for hoyer lift

## 2014-05-19 NOTE — Assessment & Plan Note (Signed)
Chronic recurrent , for immodium prn, exam benign, checking wbc with above labs

## 2014-05-19 NOTE — Addendum Note (Signed)
Addended by: Corwin LevinsJOHN, Nikita W on: 05/19/2014 12:25 PM   Modules accepted: Orders

## 2014-05-19 NOTE — Assessment & Plan Note (Signed)
BP's per home cuff 130-140's, here on the lower side.  Will cont same tx for now,  to f/u any worsening symptoms or concerns BP Readings from Last 3 Encounters:  05/19/14 118/60  05/13/14 142/48  05/11/14 70/40   For f/u bmp

## 2014-05-20 ENCOUNTER — Other Ambulatory Visit: Payer: Self-pay | Admitting: Internal Medicine

## 2014-05-20 DIAGNOSIS — D649 Anemia, unspecified: Secondary | ICD-10-CM

## 2014-05-20 NOTE — Progress Notes (Signed)
Called and left voicemail, also advising pt to return in 2 weeks only for labs

## 2014-05-25 ENCOUNTER — Emergency Department (HOSPITAL_COMMUNITY): Payer: Commercial Managed Care - HMO

## 2014-05-25 ENCOUNTER — Encounter (HOSPITAL_COMMUNITY): Payer: Self-pay | Admitting: *Deleted

## 2014-05-25 ENCOUNTER — Emergency Department (HOSPITAL_COMMUNITY)
Admission: EM | Admit: 2014-05-25 | Discharge: 2014-05-25 | Disposition: A | Discharge status: Home / Self Care | Payer: Commercial Managed Care - HMO | Attending: Emergency Medicine | Admitting: Emergency Medicine

## 2014-05-25 DIAGNOSIS — Z8709 Personal history of other diseases of the respiratory system: Secondary | ICD-10-CM | POA: Insufficient documentation

## 2014-05-25 DIAGNOSIS — Z87738 Personal history of other specified (corrected) congenital malformations of digestive system: Secondary | ICD-10-CM | POA: Diagnosis not present

## 2014-05-25 DIAGNOSIS — I10 Essential (primary) hypertension: Secondary | ICD-10-CM | POA: Insufficient documentation

## 2014-05-25 DIAGNOSIS — I509 Heart failure, unspecified: Secondary | ICD-10-CM | POA: Insufficient documentation

## 2014-05-25 DIAGNOSIS — Z95810 Presence of automatic (implantable) cardiac defibrillator: Secondary | ICD-10-CM | POA: Diagnosis not present

## 2014-05-25 DIAGNOSIS — Z79899 Other long term (current) drug therapy: Secondary | ICD-10-CM | POA: Diagnosis not present

## 2014-05-25 DIAGNOSIS — Z8673 Personal history of transient ischemic attack (TIA), and cerebral infarction without residual deficits: Secondary | ICD-10-CM | POA: Diagnosis not present

## 2014-05-25 DIAGNOSIS — I959 Hypotension, unspecified: Secondary | ICD-10-CM | POA: Insufficient documentation

## 2014-05-25 DIAGNOSIS — Z9981 Dependence on supplemental oxygen: Secondary | ICD-10-CM | POA: Insufficient documentation

## 2014-05-25 DIAGNOSIS — Z87891 Personal history of nicotine dependence: Secondary | ICD-10-CM | POA: Insufficient documentation

## 2014-05-25 DIAGNOSIS — E785 Hyperlipidemia, unspecified: Secondary | ICD-10-CM | POA: Diagnosis not present

## 2014-05-25 DIAGNOSIS — I2510 Atherosclerotic heart disease of native coronary artery without angina pectoris: Secondary | ICD-10-CM | POA: Diagnosis not present

## 2014-05-25 DIAGNOSIS — Z8719 Personal history of other diseases of the digestive system: Secondary | ICD-10-CM | POA: Diagnosis not present

## 2014-05-25 DIAGNOSIS — Z8701 Personal history of pneumonia (recurrent): Secondary | ICD-10-CM | POA: Diagnosis not present

## 2014-05-25 DIAGNOSIS — Z794 Long term (current) use of insulin: Secondary | ICD-10-CM | POA: Insufficient documentation

## 2014-05-25 DIAGNOSIS — Z87828 Personal history of other (healed) physical injury and trauma: Secondary | ICD-10-CM | POA: Insufficient documentation

## 2014-05-25 DIAGNOSIS — R918 Other nonspecific abnormal finding of lung field: Secondary | ICD-10-CM | POA: Diagnosis not present

## 2014-05-25 DIAGNOSIS — Z7982 Long term (current) use of aspirin: Secondary | ICD-10-CM | POA: Insufficient documentation

## 2014-05-25 DIAGNOSIS — E109 Type 1 diabetes mellitus without complications: Secondary | ICD-10-CM | POA: Insufficient documentation

## 2014-05-25 LAB — URINALYSIS, ROUTINE W REFLEX MICROSCOPIC
Bilirubin Urine: NEGATIVE
Glucose, UA: NEGATIVE mg/dL
HGB URINE DIPSTICK: NEGATIVE
Ketones, ur: NEGATIVE mg/dL
Leukocytes, UA: NEGATIVE
Nitrite: NEGATIVE
Protein, ur: NEGATIVE mg/dL
Specific Gravity, Urine: 1.014 (ref 1.005–1.030)
UROBILINOGEN UA: 0.2 mg/dL (ref 0.0–1.0)
pH: 5 (ref 5.0–8.0)

## 2014-05-25 LAB — COMPREHENSIVE METABOLIC PANEL
ALT: 21 U/L (ref 0–53)
AST: 26 U/L (ref 0–37)
Albumin: 2.8 g/dL — ABNORMAL LOW (ref 3.5–5.2)
Alkaline Phosphatase: 75 U/L (ref 39–117)
Anion gap: 11 (ref 5–15)
BUN: 18 mg/dL (ref 6–23)
CALCIUM: 8.4 mg/dL (ref 8.4–10.5)
CHLORIDE: 107 mmol/L (ref 96–112)
CO2: 24 mmol/L (ref 19–32)
Creatinine, Ser: 0.94 mg/dL (ref 0.50–1.35)
GFR calc Af Amer: >90 mL/min (ref >90)
GFR, EST NON AFRICAN AMERICAN: 80 mL/min — AB (ref >90)
Glucose, Bld: 183 mg/dL — ABNORMAL HIGH (ref 70–99)
Potassium: 4.3 mmol/L (ref 3.5–5.1)
SODIUM: 142 mmol/L (ref 135–145)
Total Bilirubin: 0.4 mg/dL (ref 0.3–1.2)
Total Protein: 7.6 g/dL (ref 6.0–8.3)

## 2014-05-25 LAB — CBC WITH DIFFERENTIAL/PLATELET
BASOS PCT: 1 % (ref 0–1)
Basophils Absolute: 0.1 10*3/uL (ref 0.0–0.1)
Eosinophils Absolute: 0.1 10*3/uL (ref 0.0–0.7)
Eosinophils Relative: 1 % (ref 0–5)
HCT: 28.1 % — ABNORMAL LOW (ref 39.0–52.0)
Hemoglobin: 8.5 g/dL — ABNORMAL LOW (ref 13.0–17.0)
LYMPHS PCT: 10 % — AB (ref 12–46)
Lymphs Abs: 0.9 10*3/uL (ref 0.7–4.0)
MCH: 32.2 pg (ref 26.0–34.0)
MCHC: 30.2 g/dL (ref 30.0–36.0)
MCV: 106.4 fL — AB (ref 78.0–100.0)
Monocytes Absolute: 0.9 10*3/uL (ref 0.1–1.0)
Monocytes Relative: 10 % (ref 3–12)
NEUTROS PCT: 78 % — AB (ref 43–77)
Neutro Abs: 7.1 10*3/uL (ref 1.7–7.7)
Platelets: 445 10*3/uL — ABNORMAL HIGH (ref 150–400)
RBC: 2.64 MIL/uL — AB (ref 4.22–5.81)
RDW: 14.8 % (ref 11.5–15.5)
WBC: 9.1 10*3/uL (ref 4.0–10.5)

## 2014-05-25 LAB — BRAIN NATRIURETIC PEPTIDE: B Natriuretic Peptide: 108.8 pg/mL — ABNORMAL HIGH (ref 0.0–100.0)

## 2014-05-25 LAB — I-STAT CG4 LACTIC ACID, ED: Lactic Acid, Venous: 1.36 mmol/L (ref 0.5–2.0)

## 2014-05-25 MED ORDER — IOHEXOL 300 MG/ML  SOLN
80.0000 mL | Freq: Once | INTRAMUSCULAR | Status: AC | PRN
  Administered 2014-05-25: 80 mL via INTRAVENOUS

## 2014-05-25 MED ORDER — SODIUM CHLORIDE 0.9 % IV BOLUS (SEPSIS)
500.0000 mL | Freq: Once | INTRAVENOUS | Status: AC
  Administered 2014-05-25: 500 mL via INTRAVENOUS

## 2014-05-25 NOTE — ED Provider Notes (Addendum)
CSN: [id number]     Arrival date & time 05/25/14  1221 History   First MD Initiated Contact with Patient 05/25/14 1249     Chief Complaint  Patient presents with  . Hypotension    HPI  Patient presents from home with concern for hypotension, altered mental status, weakness. Patient has known cancer, was last seen by psychologist last week. Patient and wife report that the patient had a transient change in consciousness in the hours prior to ED arrival. Patient was also weaker than usual, though he has ongoing weakness. No focal new pain, no new syncope. Patient called EMS, was found hypotensive on their arrival. Blood pressure improved with initial fluid bolus.   Past Medical History  Diagnosis Date  . 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 Automatic implantable cardiac defibrillator in situ   . Congestive heart failure, unspecified   . Retention of urine, unspecified   . Pneumonia, organism unspecified   . Cough   . Acute bronchitis   . Contusion of foot   . Other sign and symptom in breast   . Fibromyalgia   . Hyperlipidemia   . Coronary atherosclerosis of unspecified type of vessel, native or graft   . Esophageal reflux   . Type I (juvenile type) diabetes mellitus without mention of complication, uncontrolled   . Essential hypertension, benign   . Cocaine abuse   . Alcohol abuse   . Microalbuminuria   . GERD (gastroesophageal reflux disease)   . Arteriovenous malformation (AVM)     of colon  . Diverticulosis   . GI bleed     secondary to avm  . Umbilical hernia   . 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 History of oxygen administration     at bedtime uses oxygen 2l/m nasally.   Past Surgical History  Procedure Laterality Date  . Back surgery      X 2  . Tonsillectomy    . Cardiac defibrillator placement    . Colonoscopy with propofol N/A 12/23/2013     Procedure: COLONOSCOPY WITH PROPOFOL;  Surgeon: Hart Carwinora M Brodie, MD;  Location: [facility];  Service: Endoscopy;  Laterality: N/A;  . Implantable cardioverter defibrillator (icd) generator change N/A 01/01/2012    Procedure: ICD GENERATOR CHANGE;  Surgeon: Marinus MawGregg W Taylor, MD;  [facility];  Service: Cardiovascular;  Laterality: N/A;   Family History  Problem Relation Age of Onset  . Heart failure Mother   . Diabetes Sister   . Colon cancer Brother     questionable  . Prostate cancer Neg Hx    History  Substance Use Topics  . Smoking status: Former Smoker -- 2.00 packs/day for 55 years    Types: Cigarettes  . Smokeless tobacco: Never Used  . Alcohol Use: No     Comment: quit at the time of stroke    Review of Systems  Constitutional:       Per HPI, otherwise negative  HENT:       Per HPI, otherwise negative  Respiratory:       Per HPI, otherwise negative  Cardiovascular:       Per HPI, otherwise negative  Gastrointestinal: Negative for vomiting.  Endocrine:       Negative aside from HPI  Genitourinary:       Neg aside from HPI   Musculoskeletal:       Per  HPI, otherwise negative  Skin: Negative.   Neurological: Positive for weakness. Negative for seizures and syncope.      Allergies  Metformin and Keppra  Home Medications   Prior to Admission medications   Medication Sig Start Date End Date Taking? Authorizing Provider  aspirin EC 81 MG tablet Take 81 mg by mouth daily.    Historical Provider, MD  finasteride (PROSCAR) 5 MG tablet Take 1 tablet (5 mg total) by mouth daily with breakfast. 02/19/14   Corwin Levins, MD  furosemide (LASIX) 40 MG tablet Take 0.5 tablets (20 mg total) by mouth daily with breakfast. 05/13/14   Maryann Mikhail, DO  insulin NPH Human (HUMULIN N,NOVOLIN N) 100 UNIT/ML injection Inject 10 Units into the skin daily before breakfast.    Historical Provider, MD  levothyroxine (SYNTHROID, LEVOTHROID) 25 MCG tablet Take 1 tablet (25 mcg  total) by mouth daily before breakfast. 05/13/14   Nita Sells Mikhail, DO  liver oil-zinc oxide (DESITIN) 40 % ointment Apply 1 application topically as needed for irritation.    Historical Provider, MD  loperamide (IMODIUM) 1 MG/5ML solution Take 6 mg by mouth as needed for diarrhea or loose stools (TAKES AS NEEDED).     Historical Provider, MD  OXcarbazepine (TRILEPTAL) 150 MG tablet Take 1 tablet (150 mg total) by mouth 2 (two) times daily. 05/13/14 05/19/14  Maryann Mikhail, DO  Oxcarbazepine (TRILEPTAL) 300 MG tablet Take 1 tablet (300 mg total) by mouth 2 (two) times daily. 05/19/14   Maryann Mikhail, DO  potassium chloride SA (K-DUR,KLOR-CON) 20 MEQ tablet Take 1 tablet (20 mEq total) by mouth 2 (two) times daily. 02/19/14   Corwin Levins, MD  sertraline (ZOLOFT) 50 MG tablet Take 1 tablet (50 mg total) by mouth daily with breakfast. 02/19/14   Corwin Levins, MD  simvastatin (ZOCOR) 40 MG tablet Take 1 tablet (40 mg total) by mouth every evening. 02/19/14   Corwin Levins, MD  tamsulosin (FLOMAX) 0.4 MG CAPS capsule Take 1 capsule (0.4 mg total) by mouth daily after supper. 02/19/14   Corwin Levins, MD  valsartan (DIOVAN) 80 MG tablet Take 1 tablet (80 mg total) by mouth daily. 05/19/14   Corwin Levins, MD   BP 124/49 mmHg  Pulse 78  Temp(Src) 97.7 F (36.5 C) (Oral)  Resp 20  SpO2 100% Physical Exam  Constitutional: He is oriented to person, place, and time. He appears well-developed. He has a sickly appearance. No distress.  HENT:  Head: Normocephalic and atraumatic.  Eyes: Conjunctivae and EOM are normal.  Cardiovascular: Normal rate and regular rhythm.   Pulmonary/Chest: Effort normal. No stridor. No respiratory distress.  Abdominal: He exhibits no distension.  Musculoskeletal: He exhibits no edema.  Neurological: He is alert and oriented to person, place, and time. He displays no tremor. No cranial nerve deficit. He displays no seizure activity.  Skin: Skin is warm and dry.  Psychiatric: He  has a normal mood and affect.  Nursing note and vitals reviewed.   ED Course  Procedures (including critical care time) Labs Review Labs Reviewed  COMPREHENSIVE METABOLIC PANEL - Abnormal; Notable for the following:    Glucose, Bld 183 (*)    Albumin 2.8 (*)    GFR calc non Af Amer 80 (*)    All other components within normal limits  CBC WITH DIFFERENTIAL/PLATELET - Abnormal; Notable for the following:    RBC 2.64 (*)    Hemoglobin 8.5 (*)    HCT 28.1 (*)  MCV 106.4 (*)    Platelets 445 (*)    Neutrophils Relative % 78 (*)    Lymphocytes Relative 10 (*)    All other components within normal limits  URINALYSIS, ROUTINE W REFLEX MICROSCOPIC - Abnormal; Notable for the following:    APPearance CLOUDY (*)    All other components within normal limits  BRAIN NATRIURETIC PEPTIDE - Abnormal; Notable for the following:    B Natriuretic Peptide 108.8 (*)    All other components within normal limits  I-STAT CG4 LACTIC ACID, ED    Imaging Review Ct Chest W Contrast  05/25/2014   CLINICAL DATA:  Hypertension.  EXAM: CT CHEST WITH CONTRAST  TECHNIQUE: Multidetector CT imaging of the chest was performed during intravenous contrast administration.  CONTRAST:  80mL OMNIPAQUE IOHEXOL 300 MG/ML  SOLN  COMPARISON:  CT scan of January 11, 2006.  Chest radiograph today.  FINDINGS: No pneumothorax or significant pleural effusion is noted. No significant abnormality seen in the left lung. 2.7 x 1.8 cm mass is noted medially in the right upper lobe. 5.3 x 3.3 cm right peritracheal adenopathy is noted. This extends into right hilar region where it measures 3.3 x 2.9 cm. 1.9 x 1.5 cm right infrahilar mass is noted. 17 x 13 mm lymph node is noted in the aortopulmonary window which appears to be enlarged compared to prior exam. Smaller adenopathy is seen more medially in this region. Atherosclerotic calcifications of thoracic aorta are noted without aneurysm or dissection. Coronary artery calcifications are  noted. Left-sided pacemaker is noted. Visualized portion of upper abdomen appears normal.  IMPRESSION: 2.7 x 1.8 cm mass is noted in right upper lobe concerning for primary malignancy. Right peritracheal, hilar and subcarinal adenopathy is noted concerning for metastatic disease. Mildly enlarged adenopathy is noted in the aortopulmonary window as well which is also concerning for metastatic disease.   Electronically Signed   By: Lupita Raider, M.D.   On: 05/25/2014 16:27   Dg Chest Port 1 View  05/25/2014   CLINICAL DATA:  Shortness of breath.  Hypotension.  EXAM: PORTABLE CHEST - 1 VIEW  COMPARISON:  05/11/2014 and 04/20/2014  FINDINGS: There is slight fullness in the superior aspect of the right hilum and there is now some fluid along the minor fissure in the right hemi thorax.  Heart size and pulmonary vascularity are normal. No effusions. No acute osseous abnormality. AICD in place.  IMPRESSION: Fullness in the superior aspect of the right hilum with small amount of fluid along the minor fissure. The possibility of a mass or adenopathy should be considered. CT scan of the chest with contrast is recommended for further evaluation.   Electronically Signed   By: Francene Boyers M.D.   On: 05/25/2014 14:33     EKG Interpretation   Date/Time:  Monday May 25 2014 12:51:18 EDT Ventricular Rate:  78 PR Interval:  222 QRS Duration: 150 QT Interval:  419 QTC Calculation: 477 R Axis:   -84 Text Interpretation:  Sinus rhythm Atrial premature complexes Prolonged PR  interval RBBB and LAFB Abnrm T, consider ischemia, anterolateral lds Sinus  rhythm Premature atrial complexes Non-specific intra-ventricular  conduction delay Artifact T wave abnormality Abnormal ekg Confirmed by  Gerhard Munch  MD [phone]) on 05/25/2014 1:20:55 PM        After the initial evaluation I reviewed the x-ray myself, agree with the interpretation. I discussed it with the patient's wife. With concern for new opacification,  we will have CT scan  performed. We began to talk about the patient's prognosis, goals of care. She states they have previously spoken w PMD about palliative medicine.  4:51 PM Patient aware of results, And I spoke with the wife separately about the findings. They both agree that enrollment in hospice/palliative care is desired.  I discussed with our care management team, who will facilitate hospice assessment tomorrow.  MDM   This patient presents after an episode of hypotension, altered mental status. Your patient is listless, but oriented appropriately. Patient is found to have new metastatic lung masses. After lengthy patient's wife, then with him and his wife, patient was discharged home to have next day hospice assessment.     Gerhard Munch, MD 05/25/14 1653  Gerhard Munch, MD 06/12/14 [phone]

## 2014-05-25 NOTE — ED Notes (Signed)
Bed: [id number] Expected date:  Expected time:  Means of arrival:  Comments: EMS- hypotension

## 2014-05-25 NOTE — Discharge Instructions (Signed)
As discussed, with your new change in health condition, palliative care will visit tomorrow for an assessment.  Please call your physician tomorrow to ensure that he is aware of your condition.  Return here for concerning changes in your condition.

## 2014-05-25 NOTE — ED Notes (Signed)
[facility] called for transport.  

## 2014-05-25 NOTE — Progress Notes (Addendum)
74 yr old [place] [place] resident coming from home with c/o hypotension, per EMS initial bp was 90/30 CM consulted for home hospice by ED charge and EDP, Jeraldine LootsLockwood  1710 ED CM spoke with pt and wife.  Pt alert to person, knows wife and he is at hospital.   Cm reviewed hospice and palliative services Provided with contact information for [facility] [place]  Confirmed pcp is Fayrene FearingJames John,--CV is Sharlot GowdaGregg Taylor--Neurologist is Patrcia DollyKaren Aquino Primary care givers are wife and grandson lives with them  Wife denies need for DME except hoyer lift Pt has oxygen at home- [employer] [facility]  Discussed POA and living will 1657 Cm reached Clydie BraunKaren of [facility] to provide referral information dx lung cancer with mets, [facility], Automatic implantable cardiac defibrillator, CHF, Type 1 Dm, CVA (cerebral vascular accident)12-10-13 remains with right side paralysis-uses wheelchair, unable to weight bear, hx of cocaine/etoh abuse

## 2014-05-25 NOTE — ED Notes (Signed)
Per EMS pt coming from home with c/o hypotension, per EMS initial bp was 90/30, pt was given 500 ml NS IV, bp 116/68

## 2014-05-26 ENCOUNTER — Telehealth: Payer: Self-pay | Admitting: Internal Medicine

## 2014-05-26 NOTE — Telephone Encounter (Signed)
Spoke with a rep with Hospice, relayed the message to her. She said she will send the message to Olegario MessierKathy once she returns to the office after lunch.

## 2014-05-26 NOTE — Telephone Encounter (Signed)
Ok to be attending  OK for Hospice MD to handle symtpomatic medications

## 2014-05-26 NOTE — Telephone Encounter (Signed)
Osie BondKathy Blackburn with Hospice would like to know if Dr. Jonny RuizJohn would be the attending physician for patient while in care and if he would like Hospice physicians to help with symptom management.  Would like to see patient this afternoon.  Can not go out to see patient until there is a response.  Can reach GideonKathy at [phone].

## 2014-05-26 NOTE — Telephone Encounter (Signed)
Pt was in hosp yesterday and found out he has lung cancer and Hospice will be coming in to meet with her today.

## 2014-05-26 NOTE — Telephone Encounter (Signed)
TELEPHONE ADVICE RECORD [name]  Patient Name: Frank RusselJAMES Yuan  DOB: 03/20/1940    Initial Comment Caller states she is calling from Hospice and she has received a referral. She is needing an order for the patient. Lynden AngCathy is at [phone].    Nurse Assessment      Guidelines    Guideline Title Affirmed Question Affirmed Notes       Final Disposition User   Clinical Call Odis LusterBowers, RN, Rhonda    Comments  Called backline and transferred caller to office to get the orders for Hospice that she is requesting.

## 2014-05-27 ENCOUNTER — Telehealth: Payer: Self-pay | Admitting: *Deleted

## 2014-05-27 NOTE — Telephone Encounter (Signed)
[facility] - Client TELEPHONE ADVICE RECORD [facility] Patient Name: Frank RusselJAMES Costantino Gender: Male DOB: 04/01/1940 Age: [age] Y 3 M 10 D Return Phone Number: Address: [place]/State/Zip: [place] Statistic[place] [facility] - Client Client Site [facility] Day Physician [name], Eban Contact Type Call Call Type Triage / Clinical Caller Name [facility] Relationship To Patient Provider Appointment Disposition EMR Appointment Not Necessary Info pasted into Epic Yes Return Phone Number Unavailable Chief Complaint Unclassified Symptom Initial Comment Caller states she is calling from Hospice and she has received a referral. She is needing an order for the patient. Lynden AngCathy is at [phone]. Nurse Assessment Guidelines Guideline Title Affirmed Question Affirmed Notes Nurse Date/Time (Eastern Time) Disp. Time Lamount Cohen(Eastern Time) Disposition Final User 05/26/2014 1:48:54 PM Clinical Call Yes Odis LusterBowers, RN, Bjorn Loserhonda After Care Instructions Given Call Event Type User Date / Time Description Comments User: Marlyce Hugehonda, Bowers, RN Date/Time Lamount Cohen(Eastern Time): 05/26/2014 1:48:32 PM Called backline and transferred caller to office to get the orders for Hospice that she is requesting

## 2014-06-01 ENCOUNTER — Telehealth: Payer: Self-pay | Admitting: *Deleted

## 2014-06-01 NOTE — Telephone Encounter (Signed)
[name] calling for order for ICD to be turned off as requested by pt's wife Frank Hashimotoatricia. Patient is at home with Hospice.  When returning call ask for a Facilities managernurse manager.

## 2014-06-02 NOTE — Telephone Encounter (Signed)
Okay to turn off device per Dr Ladona Ridgelaylor.  Lung CA with mets.  Will contact Dennie MaizesJason Rupp to handle.  Patient is at home.  I have left Sharman CrateMaura a message

## 2014-06-07 DEATH — deceased

## 2014-06-08 ENCOUNTER — Telehealth: Payer: Self-pay

## 2014-06-08 NOTE — Telephone Encounter (Signed)
[facility].  Taking to Dr. Jonny RuizJohn for signature.  Dr. Jonny RuizJohn out of office today.  Will be in 06/09/14. [employer] [employer] came today to pick the death certificate up on 06/10/2014.

## 2015-08-08 IMAGING — CT CT CHEST W/ CM
1 series · 15 of 31 positions shown, 19 images · IV contrast (OMNIPAQUE 300)
Comparison: CT scan of January 11, 2006.  Chest radiograph today.

CLINICAL DATA: Hypertension.

EXAM:
CT CHEST WITH CONTRAST
TECHNIQUE: Multidetector CT imaging of the chest was performed during
intravenous contrast administration.
CONTRAST:  80mL OMNIPAQUE IOHEXOL 300 MG/ML  SOLN

[Series 2: chest with st · axial · 0.76mm/px · z∈[+1356,+1646]mm · 15 of 64 slices shown, 19 images]
[im 3/64  mediastinal]
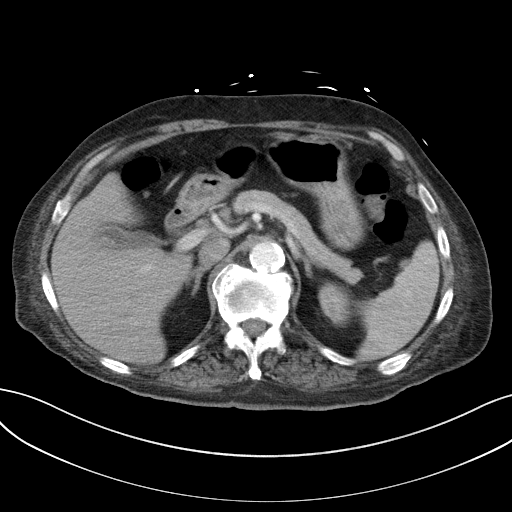
[im 3/64  lung]
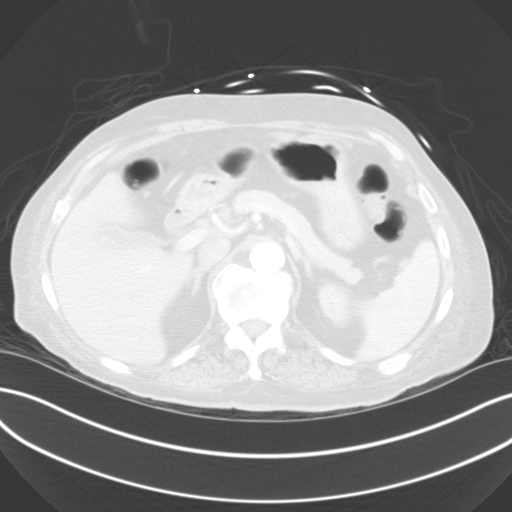
[im 8/64  lung]
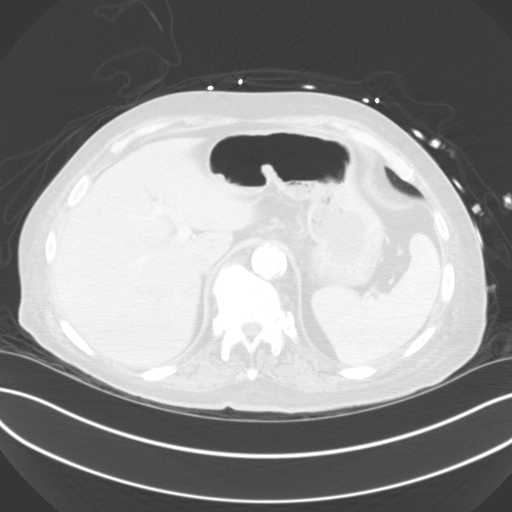
[im 12/64  lung]
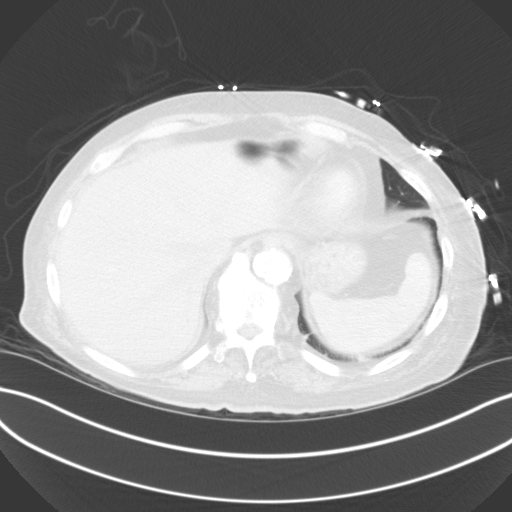
[im 15/64  lung]
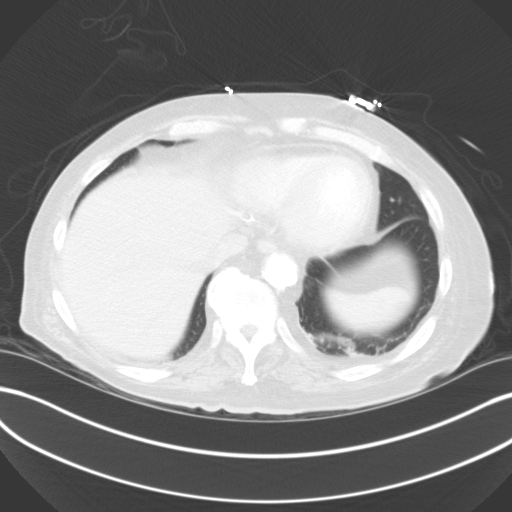
[im 19/64  mediastinal]
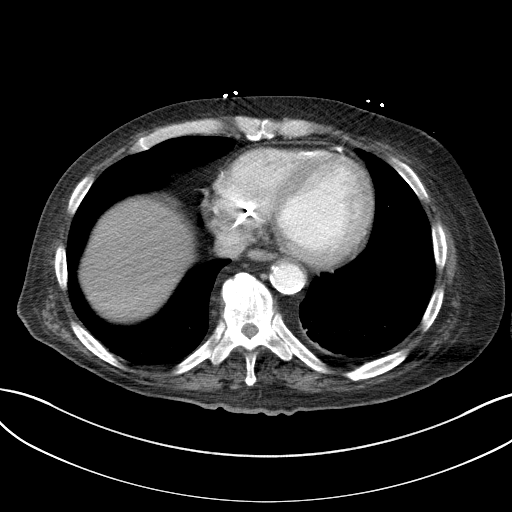
[im 19/64  lung]
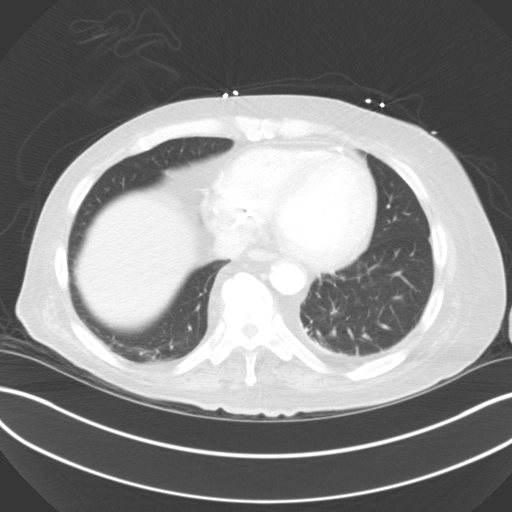
[im 24/64  lung]
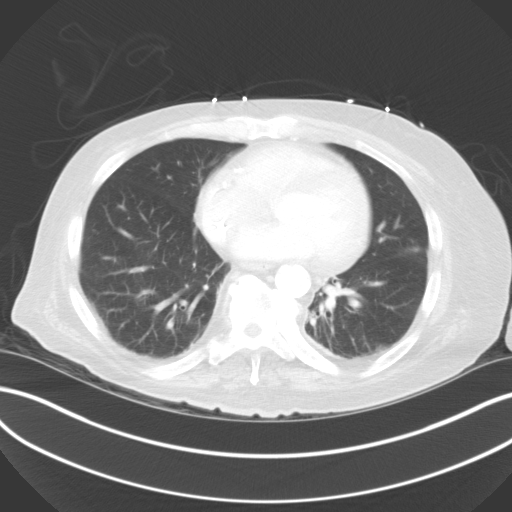
[im 29/64  lung]
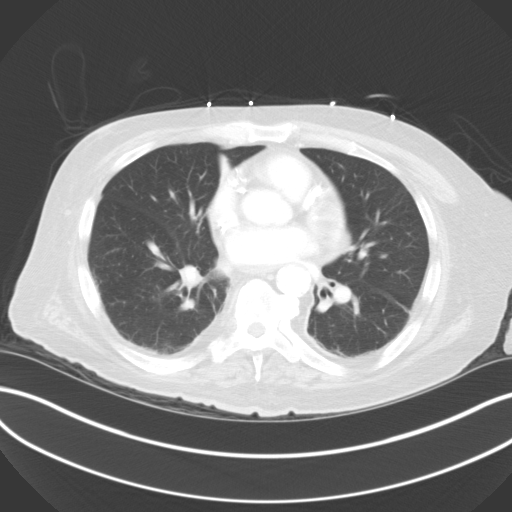
[im 33/64  lung]
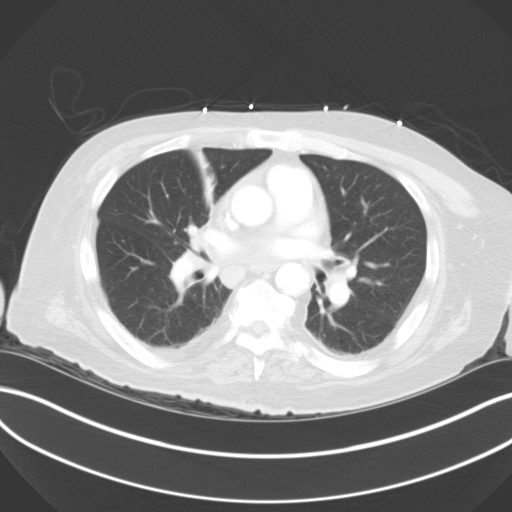
[im 36/64  mediastinal]
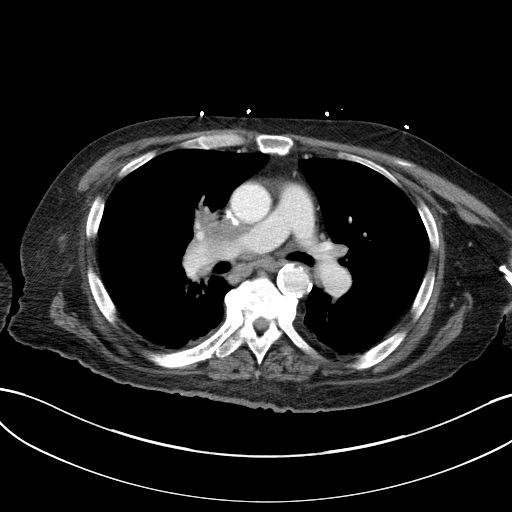
[im 36/64  lung]
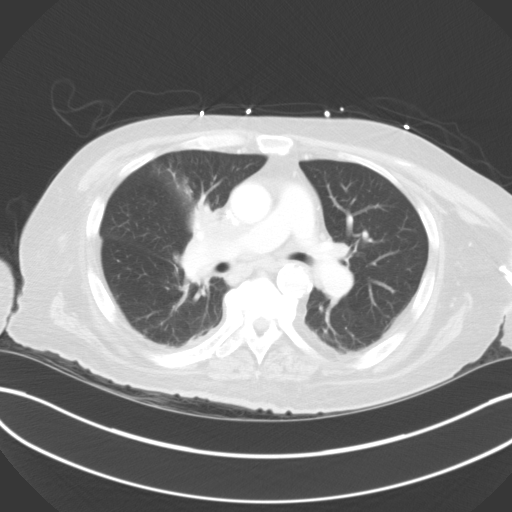
[im 40/64  lung]
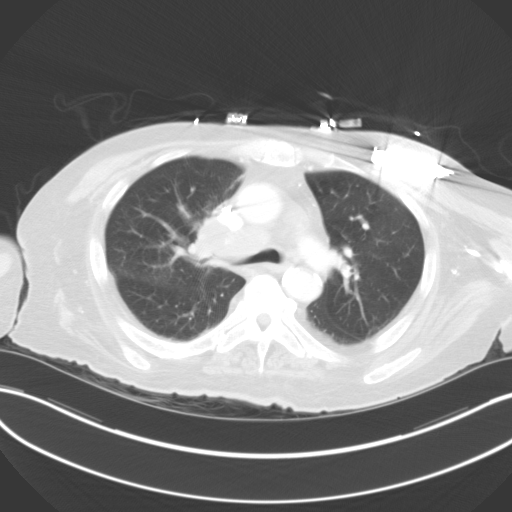
[im 45/64  lung]
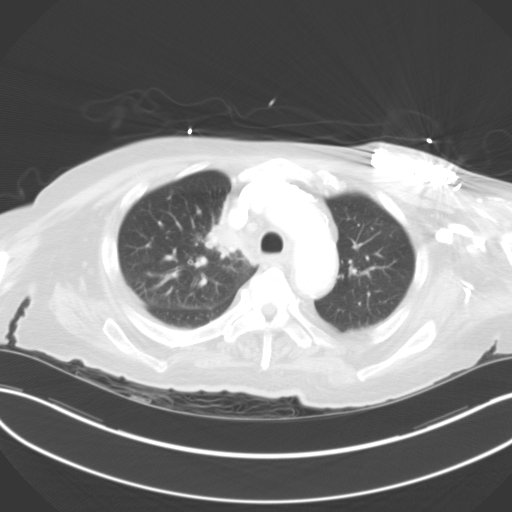
[im 50/64  lung]
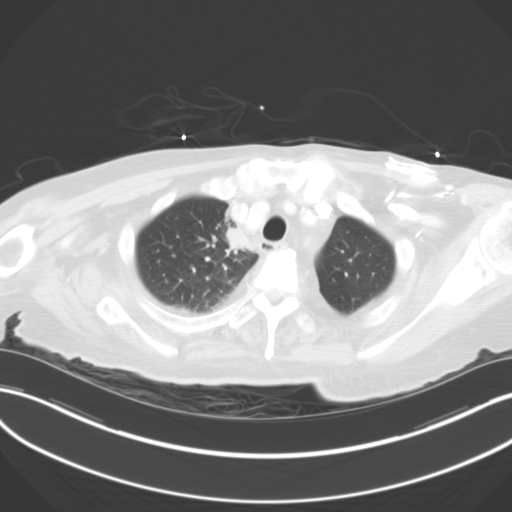
[im 52/64  mediastinal]
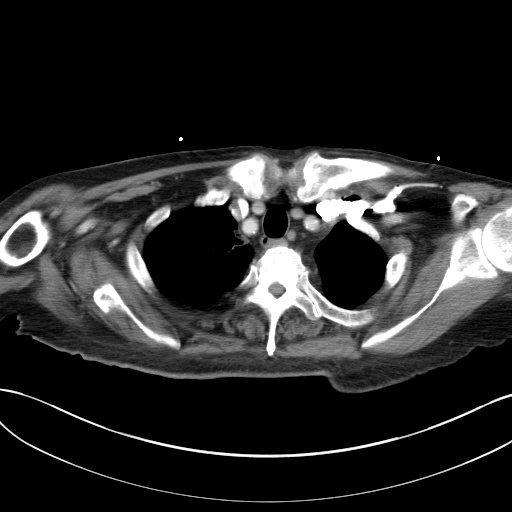
[im 52/64  lung]
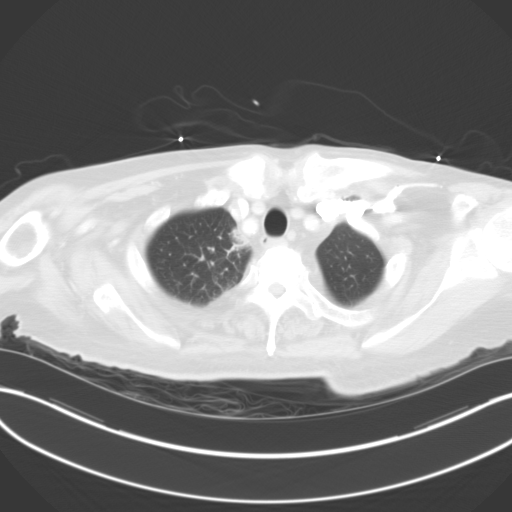
[im 57/64  lung]
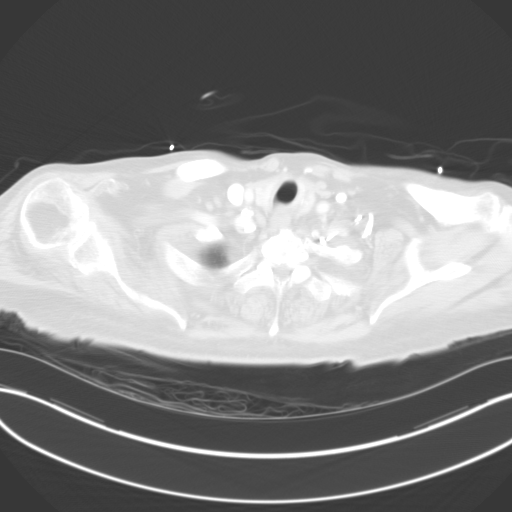
[im 61/64  lung]
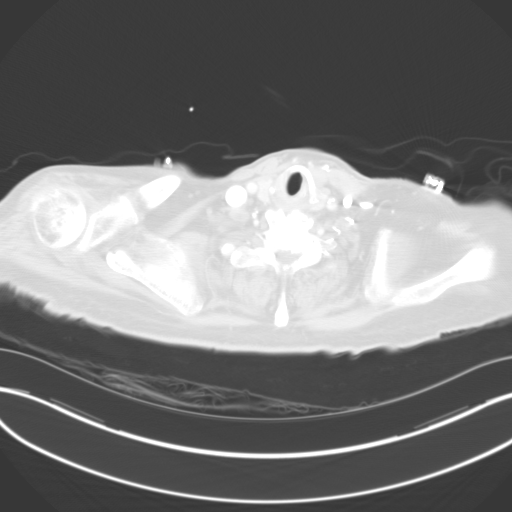

[15 of 31 positions shown; findings below may reference images not displayed]

FINDINGS: No pneumothorax or significant pleural effusion is noted. No
significant abnormality seen in the left lung. 2.7 x 1.8 cm mass is
noted medially in the right upper lobe. 5.3 x 3.3 cm right
peritracheal adenopathy is noted. This extends into right hilar
region where it measures 3.3 x 2.9 cm. 1.9 x 1.5 cm right infrahilar
mass is noted. 17 x 13 mm lymph node is noted in the aortopulmonary
window which appears to be enlarged compared to prior exam. Smaller
adenopathy is seen more medially in this region. Atherosclerotic
calcifications of thoracic aorta are noted without aneurysm or
dissection. Coronary artery calcifications are noted. Left-sided
pacemaker is noted. Visualized portion of upper abdomen appears
normal.
IMPRESSION: 2.7 x 1.8 cm mass is noted in right upper lobe concerning for
primary malignancy. Right peritracheal, hilar and subcarinal
adenopathy is noted concerning for metastatic disease. Mildly
enlarged adenopathy is noted in the aortopulmonary window as well
which is also concerning for metastatic disease.
